# Patient Record
Sex: Male | Born: 2000 | Race: Black or African American | Hispanic: No | Marital: Single | Smoking: Current some day smoker
Health system: Southern US, Community
[De-identification: ages and names within clinical notes are randomized; demographics above are authoritative.]

## PROBLEM LIST (undated history)

## (undated) DIAGNOSIS — D573 Sickle-cell trait: Secondary | ICD-10-CM

## (undated) DIAGNOSIS — D649 Anemia, unspecified: Secondary | ICD-10-CM

## (undated) HISTORY — PX: CIRCUMCISION: SHX1350

---
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
## 2019-04-20 ENCOUNTER — Other Ambulatory Visit: Payer: Self-pay

## 2019-04-20 ENCOUNTER — Observation Stay (HOSPITAL_COMMUNITY)
Admission: RE | Admit: 2019-04-20 | Discharge: 2019-04-21 | Disposition: A | Discharge status: Home / Self Care | Payer: Federal, State, Local not specified - PPO | Attending: Psychiatry | Admitting: Psychiatry

## 2019-04-20 ENCOUNTER — Encounter (HOSPITAL_COMMUNITY): Payer: Self-pay | Admitting: Family

## 2019-04-20 DIAGNOSIS — F329 Major depressive disorder, single episode, unspecified: Secondary | ICD-10-CM | POA: Diagnosis present

## 2019-04-20 DIAGNOSIS — R440 Auditory hallucinations: Secondary | ICD-10-CM | POA: Insufficient documentation

## 2019-04-20 DIAGNOSIS — D573 Sickle-cell trait: Secondary | ICD-10-CM | POA: Diagnosis not present

## 2019-04-20 DIAGNOSIS — F12951 Cannabis use, unspecified with psychotic disorder with hallucinations: Principal | ICD-10-CM | POA: Insufficient documentation

## 2019-04-20 DIAGNOSIS — Z20822 Contact with and (suspected) exposure to covid-19: Secondary | ICD-10-CM | POA: Diagnosis not present

## 2019-04-20 LAB — RESPIRATORY PANEL BY RT PCR (FLU A&B, COVID)
Influenza A by PCR: NEGATIVE
Influenza B by PCR: NEGATIVE
SARS Coronavirus 2 by RT PCR: NEGATIVE

## 2019-04-20 MED ORDER — ZIPRASIDONE MESYLATE 20 MG IM SOLR
10.0000 mg | Freq: Once | INTRAMUSCULAR | Status: AC
  Administered 2019-04-20: 10 mg via INTRAMUSCULAR
  Filled 2019-04-20: qty 20

## 2019-04-20 MED ORDER — HYDROXYZINE HCL 25 MG PO TABS: 25.0000 mg | ORAL_TABLET | Freq: Three times a day (TID) | ORAL | Status: DC | PRN

## 2019-04-20 MED ORDER — MAGNESIUM HYDROXIDE 400 MG/5ML PO SUSP
30.0000 mL | Freq: Every day | ORAL | Status: DC | PRN
Start: 2019-04-20 — End: 2019-04-21

## 2019-04-20 MED ORDER — LORAZEPAM 1 MG PO TABS: 2.0000 mg | ORAL_TABLET | ORAL | Status: DC | PRN

## 2019-04-20 MED ORDER — ACETAMINOPHEN 325 MG PO TABS: 650.0000 mg | ORAL_TABLET | Freq: Four times a day (QID) | ORAL | Status: DC | PRN

## 2019-04-20 MED ORDER — ALUM & MAG HYDROXIDE-SIMETH 200-200-20 MG/5ML PO SUSP: 30.0000 mL | ORAL | Status: DC | PRN

## 2019-04-20 MED ORDER — RISPERIDONE 3 MG PO TABS: 3.0000 mg | ORAL_TABLET | Freq: Every day | ORAL | Status: DC

## 2019-04-20 MED ORDER — TRAZODONE HCL 50 MG PO TABS: 50.0000 mg | ORAL_TABLET | Freq: Every evening | ORAL | Status: DC | PRN

## 2019-04-20 NOTE — H&P (Addendum)
Behavioral Health Medical Screening Exam  Garrett Bolton is an 19 y.o. male. Presented under IVC due to bazaar behavior. Family reported patient as been responding to internal stimuli after using marijuana. Stated patient symptoms started about one ago. Patient present with parania and thought blocking. Patient validates auditory hallucination. Patient to be admitted for inpatient admission. Support, encouragement and reassurance was provided.     Total Time spent with patient: 15 minutes  Psychiatric Specialty Exam: Physical Exam  Review of Systems  Blood pressure (!) 148/96, pulse 88, temperature 97.7 F (36.5 C), temperature source Oral, resp. rate 18, SpO2 99 %.There is no height or weight on file to calculate BMI.  General Appearance: Casual  Eye Contact:  Minimal  Speech:  Blocked and Slow  Volume:  Decreased  Mood:  Depressed, Dysphoric and Irritable  Affect:  Flat  Thought Process:  Disorganized and Descriptions of Associations: Circumstantial  Orientation:  Other:  person and place  Thought Content:  Hallucinations: Auditory  Suicidal Thoughts:  Yes.  without intent/plan  Homicidal Thoughts:  No  Memory:  Immediate;   Poor Recent;   Poor  Judgement:  Impaired  Insight:  Lacking  Psychomotor Activity:  Restlessness  Concentration: Concentration: Poor  Recall:  Poor  Fund of Knowledge:Poor  Language: Fair  Akathisia:  No  Handed:  Right  AIMS (if indicated):     Assets:  Communication Skills Desire for Improvement Social Support Talents/Skills  Sleep:       Musculoskeletal: Strength & Muscle Tone: within normal limits Gait & Station: normal Patient leans: N/A  Blood pressure (!) 148/96, pulse 88, temperature 97.7 F (36.5 C), temperature source Oral, resp. rate 18, SpO2 99 %.  Recommendations: Inpatient admission 500 unit when avab.  Based on my evaluation the patient does not appear to have an emergency medical condition.  Oneta Rack, NP 04/20/2019,  6:11 PM

## 2019-04-20 NOTE — Plan of Care (Signed)
[facility] Crises Note  Reason for Crisis Plan:  Paranoia & Bizarre Bebaviors  Plan of Care:  Medication & other therapy as needed (To be determined by Providers & Treatment team)  Family Support:    Sister and other Family members  Current Living Environment:  Living Arrangements: Other relatives  Insurance:   Hospital Account    Name Acct ID Class Status Primary Coverage   Auron, Tadros [id number] BEHAVIORAL HEALTH OBSERVATION Open BLUE CROSS BLUE SHIELD - BCBS/[employer] PPO        Guarantor Account (for Hospital Account [id number])    Name Relation to Pt Service Area Active? Acct Type   [name] Self [facility] Yes Personal/Family   [place] [address]       [address], [place] [address] [phone](H)          Coverage Information (for Hospital Account [id number])    1. BLUE CROSS BLUE SHIELD/BCBS/F[employer]AL EMP PPO    F/O Payor/Plan Precert #   BLUE CROSS BLUE SHIELD/BCBS/[employer]AL E[employer] PPO    Subscriber Subscriber #   Ander Gaster [id number]   Address Phone   [address] [place], [place] [address] [phone]       2. [place]    F/O Payor/Plan Precert #   [facility] MEDICAID/[place]    Subscriber Subscriber #   Zebulan, Hinshaw [id number] P   Address Phone   [address] [place], [place] [address] [phone]          Legal Guardian:     Primary Care Provider:  Albina Billet, MD  Current Outpatient Providers:  [facility]  Psychiatrist:  Name of Psychiatrist: none report   Counselor/Therapist:  Name of Therapist: none report   Compliant with Medications:  N/A  Additional Information: Patient is a 19 year old African American male involuntarily committed by his sister for bizarre behaviors and paranoia after he smoked marijuana.  Pt calm and cooperative with admissions process, denies SI/HI/AVH currently, has some thought blocking.  Pt states he does not know why his sister took out IVC papers on  him and stated "I was sitting there minding my business and the next thing, I knew, the police came and got me.  I do not know why she did this".    Pt denies any recreational drug use, states that he is on house arrest, and observed to have an ankle law enforcement monitor.  Pt also not forthcoming as to why he is on house arrest.  Q15 minute checks initiated for safety, pt educated on admissions process, verbalizes understanding, non invasive skin assessment completed.   Tomas Schamp 1/31/202110:09 PM

## 2019-04-20 NOTE — BH Assessment (Signed)
Assessment Note  Garrett Bolton is an 19 y.o. male present to [facility] via IVC taking out by his sister due to bazaar behavior that he has been displaying the past week. Per sister report patient has been acting 'strange' since he smoked marijuana, he's responding to internal stimuli. Patient present with parania and thought blocking. Patient validates auditory hallucination.    Disposition:  Hillery Jacks, NP, Patient to be admitted for inpatient admission.   Diagnosis:  F23   Brief psychotic disorder  Past Medical History:  Past Medical History:  Diagnosis Date  . Anemia   . Sickle cell trait [facility])     Past Surgical History:  Procedure Laterality Date  . CIRCUMCISION  2001    Family History: No family history on file.  Social History:  reports that he has never smoked. He has never used smokeless tobacco. He reports that he does not drink alcohol or use drugs.  Additional Social History:  Alcohol / Drug Use Pain Medications: see MAR Prescriptions: see MAR Over the Counter: see MAR History of alcohol / drug use?: Yes Substance #1 Name of Substance 1: THC 1 - Age of First Use: unknown 1 - Amount (size/oz): unknown 1 - Frequency: unknown 1 - Duration: unknown 1 - Last Use / Amount: unknown  CIWA: CIWA-Ar BP: (!) 148/96 Pulse Rate: 88 COWS:    Allergies: No Known Allergies  Home Medications: (Not in a hospital admission)   OB/GYN Status:  No LMP for male patient.  General Assessment Data Location of Assessment: [facility](walk-in) TTS Assessment: In system Is this a Tele or Face-to-Face Assessment?: Face-to-Face Is this an Initial Assessment or a Re-assessment for this encounter?: Initial Assessment Patient Accompanied by:: Adult(sister) Permission Given to speak with another: Yes Name, Relationship and Phone Number: sister - Deyton Ellenbecker [phone]) Language Other than English: No Living Arrangements: (UTA) What gender do you identify as?:  Male Marital status: Single Living Arrangements: (UTA) Can pt return to current living arrangement?: Yes Admission Status: Involuntary Petitioner: Family member(sister; Roland Rack [phone]) ) Is patient capable of signing voluntary admission?: (No; altered memory ) Referral Source: Self/Family/Friend(IVC ) Insurance type: Medicaid      Crisis Care Plan Living Arrangements: (UTA) Name of Psychiatrist: none report  Name of Therapist: none report   Education Status Is patient currently in school?: No Is the patient employed, unemployed or receiving disability?: Employed  Risk to self with the past 6 months Suicidal Ideation: No Has patient been a risk to self within the past 6 months prior to admission? : No Suicidal Intent: No Has patient had any suicidal intent within the past 6 months prior to admission? : No Is patient at risk for suicide?: No Suicidal Plan?: No Has patient had any suicidal plan within the past 6 months prior to admission? : No Access to Means: No What has been your use of drugs/alcohol within the last 12 months?: THC  Previous Attempts/Gestures: No How many times?: 0 Other Self Harm Risks: none report  Triggers for Past Attempts: None known Intentional Self Injurious Behavior: (none report) Family Suicide History: Unable to assess Recent stressful life event(s): (UTA ) Persecutory voices/beliefs?: Rich Reining ) Depression: (UTA ) Depression Symptoms: (UTA) Substance abuse history and/or treatment for substance abuse?: No Suicide prevention information given to non-admitted patients: Not applicable  Risk to Others within the past 6 months Homicidal Ideation: No Does patient have any lifetime risk of violence toward others beyond the six months prior to admission? : No  Thoughts of Harm to Others: No Current Homicidal Intent: No Current Homicidal Plan: No Access to Homicidal Means: No Identified Victim: n/a History of harm to others?:  No Assessment of Violence: None Noted Violent Behavior Description: None Noted  Does patient have access to weapons?: No Criminal Charges Pending?: No Does patient have a court date: No Is patient on probation?: No  Psychosis Hallucinations: (UTA) Delusions: (UTA)  Mental Status Report Appearance/Hygiene: Unable to Assess Eye Contact: Unable to Assess Motor Activity: Unable to assess Speech: Unable to assess Level of Consciousness: Unable to assess Mood: (UTA) Affect: Unable to Assess Anxiety Level: None Thought Processes: Unable to Assess Judgement: Unable to Assess Orientation: Unable to assess Obsessive Compulsive Thoughts/Behaviors: Unable to Assess  Cognitive Functioning Concentration: Unable to Assess Memory: Unable to Assess Is patient IDD: No Insight: Unable to Assess Impulse Control: Unable to Assess Appetite: (per sister report, pt is not eating ) Have you had any weight changes? : No Change Sleep: Unable to Assess Total Hours of Sleep: (per sister report, pt is not sleep) Vegetative Symptoms: Unable to Assess  ADLScreening [facility] Assessment Services) Patient's cognitive ability adequate to safely complete daily activities?: Yes Patient able to express need for assistance with ADLs?: Yes Independently performs ADLs?: Yes (appropriate for developmental age)  Prior Inpatient Therapy Prior Inpatient Therapy: No  Prior Outpatient Therapy Prior Outpatient Therapy: No Does patient have an ACCT team?: No Does patient have Intensive In-House Services?  : No Does patient have [facility] services? : No Does patient have P4CC services?: No  ADL Screening (condition at time of admission) Patient's cognitive ability adequate to safely complete daily activities?: Yes Is the patient deaf or have difficulty hearing?: No Does the patient have difficulty seeing, even when wearing glasses/contacts?: No Does the patient have difficulty concentrating, remembering, or making  decisions?: No Patient able to express need for assistance with ADLs?: Yes Does the patient have difficulty dressing or bathing?: No Independently performs ADLs?: Yes (appropriate for developmental age) Does the patient have difficulty walking or climbing stairs?: No       Abuse/Neglect Assessment (Assessment to be complete while patient is alone) Abuse/Neglect Assessment Can Be Completed: Unable to assess, patient is non-responsive or altered mental status     Advance Directives (For Healthcare) Does Patient Have a Medical Advance Directive?: No Would patient like information on creating a medical advance directive?: No - Patient declined          Disposition:  Disposition Initial Assessment Completed for this Encounter: Darci Current, NP, patient recommend for inpt treatment ) Disposition of Patient: Admit(Tanika Lewis, NP, patient recommended for inpatient treatmen) Type of inpatient treatment program: Adult Patient refused recommended treatment: No  On Site Evaluation by:   Reviewed with Physician:    Despina Hidden 04/20/2019 6:32 PM

## 2019-04-21 ENCOUNTER — Encounter (HOSPITAL_COMMUNITY): Payer: Self-pay | Admitting: Family

## 2019-04-21 DIAGNOSIS — F12951 Cannabis use, unspecified with psychotic disorder with hallucinations: Secondary | ICD-10-CM

## 2019-04-21 LAB — COMPREHENSIVE METABOLIC PANEL
ALT: 14 U/L (ref 0–44)
AST: 23 U/L (ref 15–41)
Albumin: 4.8 g/dL (ref 3.5–5.0)
Alkaline Phosphatase: 78 U/L (ref 38–126)
Anion gap: 11 (ref 5–15)
BUN: 15 mg/dL (ref 6–20)
CO2: 25 mmol/L (ref 22–32)
Calcium: 9.7 mg/dL (ref 8.9–10.3)
Chloride: 106 mmol/L (ref 98–111)
Creatinine, Ser: 0.85 mg/dL (ref 0.61–1.24)
GFR calc Af Amer: >60 mL/min (ref >60)
GFR calc non Af Amer: >60 mL/min (ref >60)
Glucose, Bld: 101 mg/dL — ABNORMAL HIGH (ref 70–99)
Potassium: 3.9 mmol/L (ref 3.5–5.1)
Sodium: 142 mmol/L (ref 135–145)
Total Bilirubin: 1 mg/dL (ref 0.3–1.2)
Total Protein: 7.7 g/dL (ref 6.5–8.1)

## 2019-04-21 LAB — TSH: TSH: 0.734 u[IU]/mL (ref 0.350–4.500)

## 2019-04-21 LAB — LIPID PANEL
Cholesterol: 153 mg/dL (ref 0–200)
HDL: 52 mg/dL (ref >40)
LDL Cholesterol: 90 mg/dL (ref 0–99)
Total CHOL/HDL Ratio: 2.9 RATIO
Triglycerides: 53 mg/dL (ref <150)
VLDL: 11 mg/dL (ref 0–40)

## 2019-04-21 LAB — CBC
HCT: 46.3 % (ref 39.0–52.0)
Hemoglobin: 14.1 g/dL (ref 13.0–17.0)
MCH: 23.4 pg — ABNORMAL LOW (ref 26.0–34.0)
MCHC: 30.5 g/dL (ref 30.0–36.0)
MCV: 76.9 fL — ABNORMAL LOW (ref 80.0–100.0)
Platelets: 297 10*3/uL (ref 150–400)
RBC: 6.02 MIL/uL — ABNORMAL HIGH (ref 4.22–5.81)
RDW: 13.2 % (ref 11.5–15.5)
WBC: 7.8 10*3/uL (ref 4.0–10.5)
nRBC: 0 % (ref 0.0–0.2)

## 2019-04-21 LAB — HEMOGLOBIN A1C
Hgb A1c MFr Bld: 4.3 % — ABNORMAL LOW (ref 4.8–5.6)
Mean Plasma Glucose: 76.71 mg/dL

## 2019-04-21 MED ORDER — OLANZAPINE 2.5 MG PO TABS: 2.5000 mg | ORAL_TABLET | Freq: Every day | ORAL | 0 refills | Status: DC

## 2019-04-21 MED ORDER — TRAZODONE HCL 50 MG PO TABS: 150.0000 mg | ORAL_TABLET | Freq: Every day | ORAL | Status: DC

## 2019-04-21 MED ORDER — RISPERIDONE 3 MG PO TABS: 3.0000 mg | ORAL_TABLET | Freq: Every day | ORAL | 0 refills | Status: DC

## 2019-04-21 MED ORDER — OMEGA-3-ACID ETHYL ESTERS 1 G PO CAPS
1.0000 g | ORAL_CAPSULE | Freq: Two times a day (BID) | ORAL | Status: DC
  Filled 2019-04-21: qty 1

## 2019-04-21 MED ORDER — TRAZODONE HCL 150 MG PO TABS: 150.0000 mg | ORAL_TABLET | Freq: Every day | ORAL | 0 refills | Status: DC
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
## 2019-04-21 NOTE — Progress Notes (Signed)
Patient ID: Garrett Bolton, male   DOB: 2000/12/18, 19 y.o.   MRN: [id number] Pt transferred to the 400 hall without  Incidence, still demanding to go home, V/S WNL, report given to assigned day shift RN, Q15 minute checks in place.

## 2019-04-21 NOTE — Progress Notes (Signed)
Discharge:  Pt is a black male  of 18 years. Pt is alert and oriented X 4. Pt given AVS packet to include medication information update, future appointment information, and other resource information. Pt given all belongings and signature obtained. Pt discharged to private resident with family member. Transportation provided my family member.    Einar Crow. Melvyn Neth MSN, RN, [facility] [phone]

## 2019-04-21 NOTE — BH Assessment (Signed)
[facility] Assessment Progress Note  Per Nelly Rout, MD, this pt does not require psychiatric hospitalization at this time.  Pt presents under IVC initiated by pt's sister, which Dr Lucianne Muss has rescinded.  Pt is to be discharged from the [facility] with referral information for area therapists.  This has been included in pt's discharge instructions.  Pt's nurse has been notified.  Doylene Canning, MA Triage Specialist [phone]
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
## 2019-04-21 NOTE — Discharge Instructions (Signed)
For your behavioral health needs, you are advised to follow up with an outpatient therapist.  Contact one of the following providers at your earliest opportunity to ask about scheduling an intake appointment:       [facility]      [address]      [place], [place] [address]      [phone]       [facility]      [address]      [place], [place] [address]      [phone]       [facility]      [address]      [place], [place] [address]      [phone]
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
## 2019-04-21 NOTE — Progress Notes (Signed)

## 2019-04-22 LAB — PROLACTIN: Prolactin: 15.2 ng/mL (ref 4.0–15.2)

## 2019-04-24 ENCOUNTER — Ambulatory Visit (HOSPITAL_COMMUNITY)
Admission: RE | Admit: 2019-04-24 | Discharge: 2019-04-24 | Disposition: A | Discharge status: Home / Self Care | Payer: Medicaid Other | Attending: Psychiatry | Admitting: Psychiatry

## 2019-04-24 ENCOUNTER — Other Ambulatory Visit: Payer: Self-pay

## 2019-04-24 ENCOUNTER — Emergency Department (HOSPITAL_COMMUNITY)
Admission: EM | Admit: 2019-04-24 | Discharge: 2019-04-25 | Disposition: A | Discharge status: Other Acute Inpatient Hospital | Payer: Federal, State, Local not specified - PPO | Source: Home / Self Care | Attending: Emergency Medicine | Admitting: Emergency Medicine

## 2019-04-24 DIAGNOSIS — Z79899 Other long term (current) drug therapy: Secondary | ICD-10-CM | POA: Insufficient documentation

## 2019-04-24 DIAGNOSIS — R4689 Other symptoms and signs involving appearance and behavior: Secondary | ICD-10-CM

## 2019-04-24 DIAGNOSIS — Z20822 Contact with and (suspected) exposure to covid-19: Secondary | ICD-10-CM | POA: Insufficient documentation

## 2019-04-24 DIAGNOSIS — F22 Delusional disorders: Secondary | ICD-10-CM | POA: Insufficient documentation

## 2019-04-24 DIAGNOSIS — F12951 Cannabis use, unspecified with psychotic disorder with hallucinations: Secondary | ICD-10-CM | POA: Insufficient documentation

## 2019-04-24 LAB — COMPREHENSIVE METABOLIC PANEL
ALT: 16 U/L (ref 0–44)
AST: 23 U/L (ref 15–41)
Albumin: 4.5 g/dL (ref 3.5–5.0)
Alkaline Phosphatase: 71 U/L (ref 38–126)
Anion gap: 7 (ref 5–15)
BUN: 17 mg/dL (ref 6–20)
CO2: 27 mmol/L (ref 22–32)
Calcium: 9.4 mg/dL (ref 8.9–10.3)
Chloride: 106 mmol/L (ref 98–111)
Creatinine, Ser: 0.94 mg/dL (ref 0.61–1.24)
GFR calc Af Amer: >60 mL/min (ref >60)
GFR calc non Af Amer: >60 mL/min (ref >60)
Glucose, Bld: 97 mg/dL (ref 70–99)
Potassium: 3.8 mmol/L (ref 3.5–5.1)
Sodium: 140 mmol/L (ref 135–145)
Total Bilirubin: 0.5 mg/dL (ref 0.3–1.2)
Total Protein: 6.9 g/dL (ref 6.5–8.1)

## 2019-04-24 LAB — CBC WITH DIFFERENTIAL/PLATELET
Abs Immature Granulocytes: 0.02 10*3/uL (ref 0.00–0.07)
Basophils Absolute: 0.1 10*3/uL (ref 0.0–0.1)
Basophils Relative: 1 %
Eosinophils Absolute: 0.3 10*3/uL (ref 0.0–0.5)
Eosinophils Relative: 3 %
HCT: 40.5 % (ref 39.0–52.0)
Hemoglobin: 12.2 g/dL — ABNORMAL LOW (ref 13.0–17.0)
Immature Granulocytes: 0 %
Lymphocytes Relative: 39 %
Lymphs Abs: 3.2 10*3/uL (ref 0.7–4.0)
MCH: 23.2 pg — ABNORMAL LOW (ref 26.0–34.0)
MCHC: 30.1 g/dL (ref 30.0–36.0)
MCV: 77.1 fL — ABNORMAL LOW (ref 80.0–100.0)
Monocytes Absolute: 0.6 10*3/uL (ref 0.1–1.0)
Monocytes Relative: 7 %
Neutro Abs: 4 10*3/uL (ref 1.7–7.7)
Neutrophils Relative %: 50 %
Platelets: 286 10*3/uL (ref 150–400)
RBC: 5.25 MIL/uL (ref 4.22–5.81)
RDW: 12.8 % (ref 11.5–15.5)
WBC: 8.1 10*3/uL (ref 4.0–10.5)
nRBC: 0 % (ref 0.0–0.2)

## 2019-04-24 LAB — RAPID URINE DRUG SCREEN, HOSP PERFORMED
Amphetamines: NOT DETECTED
Barbiturates: NOT DETECTED
Benzodiazepines: NOT DETECTED
Cocaine: NOT DETECTED
Opiates: NOT DETECTED
Tetrahydrocannabinol: POSITIVE — AB

## 2019-04-24 LAB — ETHANOL: Alcohol, Ethyl (B): <10 mg/dL (ref <10)

## 2019-04-24 MED ORDER — TRAZODONE HCL 50 MG PO TABS
150.0000 mg | ORAL_TABLET | Freq: Every day | ORAL | Status: DC
  Filled 2019-04-24: qty 1

## 2019-04-24 MED ORDER — RISPERIDONE 2 MG PO TABS
3.0000 mg | ORAL_TABLET | Freq: Every day | ORAL | Status: DC
  Filled 2019-04-24: qty 1
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
## 2019-04-24 NOTE — ED Triage Notes (Signed)
Pt brought to [facility] by [employer]. Pt IVCd . Pt brought to [facility] from [facility] for Medical Clearance . Pt attempted to jump off stairs to plaza level of courthouse , ran into oncoming traffic. Paranoid.

## 2019-04-24 NOTE — Progress Notes (Signed)
Received Garrett Bolton awake in his room with the sitter at the bedside.Later he gave an urine specimen,  allowed this writer to draw his blood and perform a Covid test. He did not respond when asked what brought him to the hospital. He had an outburst of laughter and stated he was thinking about something funny. He refused his night time medications. He slept throughout the night without incident.

## 2019-04-25 ENCOUNTER — Inpatient Hospital Stay (HOSPITAL_COMMUNITY)
Admission: AD | Admit: 2019-04-25 | Discharge: 2019-04-29 | DRG: 885 | Disposition: A | Discharge status: Home / Self Care | Payer: Federal, State, Local not specified - PPO | Attending: Psychiatry | Admitting: Psychiatry

## 2019-04-25 ENCOUNTER — Encounter (HOSPITAL_COMMUNITY): Payer: Self-pay | Admitting: Psychiatry

## 2019-04-25 DIAGNOSIS — Z20822 Contact with and (suspected) exposure to covid-19: Secondary | ICD-10-CM | POA: Diagnosis present

## 2019-04-25 DIAGNOSIS — D573 Sickle-cell trait: Secondary | ICD-10-CM | POA: Diagnosis present

## 2019-04-25 DIAGNOSIS — Z87820 Personal history of traumatic brain injury: Secondary | ICD-10-CM | POA: Diagnosis not present

## 2019-04-25 DIAGNOSIS — F12951 Cannabis use, unspecified with psychotic disorder with hallucinations: Secondary | ICD-10-CM | POA: Diagnosis not present

## 2019-04-25 DIAGNOSIS — Z79899 Other long term (current) drug therapy: Secondary | ICD-10-CM | POA: Diagnosis not present

## 2019-04-25 DIAGNOSIS — F29 Unspecified psychosis not due to a substance or known physiological condition: Secondary | ICD-10-CM | POA: Diagnosis present

## 2019-04-25 DIAGNOSIS — F12251 Cannabis dependence with psychotic disorder with hallucinations: Secondary | ICD-10-CM | POA: Diagnosis present

## 2019-04-25 DIAGNOSIS — F2081 Schizophreniform disorder: Secondary | ICD-10-CM | POA: Diagnosis not present

## 2019-04-25 LAB — RESPIRATORY PANEL BY RT PCR (FLU A&B, COVID)
Influenza A by PCR: NEGATIVE
Influenza B by PCR: NEGATIVE
SARS Coronavirus 2 by RT PCR: NEGATIVE

## 2019-04-25 MED ORDER — TRAZODONE HCL 150 MG PO TABS
150.0000 mg | ORAL_TABLET | Freq: Every day | ORAL | Status: DC
  Administered 2019-04-26 – 2019-04-28 (×3): 150 mg via ORAL
  Filled 2019-04-25 (×6): qty 1

## 2019-04-25 MED ORDER — MAGNESIUM HYDROXIDE 400 MG/5ML PO SUSP: 30.0000 mL | Freq: Every day | ORAL | Status: DC | PRN

## 2019-04-25 MED ORDER — RISPERIDONE 3 MG PO TABS
3.0000 mg | ORAL_TABLET | Freq: Every day | ORAL | Status: DC
  Filled 2019-04-25: qty 1

## 2019-04-25 MED ORDER — RISPERIDONE 2 MG PO TABS
4.0000 mg | ORAL_TABLET | Freq: Every day | ORAL | Status: DC
  Administered 2019-04-25 – 2019-04-26 (×2): 4 mg via ORAL
  Filled 2019-04-25 (×5): qty 2

## 2019-04-25 MED ORDER — OMEGA-3-ACID ETHYL ESTERS 1 G PO CAPS
1.0000 g | ORAL_CAPSULE | Freq: Two times a day (BID) | ORAL | Status: DC
  Administered 2019-04-25 – 2019-04-29 (×8): 1 g via ORAL
  Filled 2019-04-25 (×11): qty 1

## 2019-04-25 MED ORDER — ALUM & MAG HYDROXIDE-SIMETH 200-200-20 MG/5ML PO SUSP: 30.0000 mL | ORAL | Status: DC | PRN

## 2019-04-25 MED ORDER — LORAZEPAM 2 MG/ML IJ SOLN: 2.0000 mg | INTRAMUSCULAR | Status: DC | PRN

## 2019-04-25 MED ORDER — HYDROXYZINE HCL 25 MG PO TABS: 25.0000 mg | ORAL_TABLET | Freq: Three times a day (TID) | ORAL | Status: DC | PRN

## 2019-04-25 MED ORDER — ACETAMINOPHEN 325 MG PO TABS: 650.0000 mg | ORAL_TABLET | Freq: Four times a day (QID) | ORAL | Status: DC | PRN

## 2019-04-25 MED ORDER — LORAZEPAM 1 MG PO TABS: 2.0000 mg | ORAL_TABLET | ORAL | Status: DC | PRN

## 2019-04-25 NOTE — Tx Team (Signed)
Initial Treatment Plan 04/25/2019 7:37 PM Garrett Bolton SUP:[id number]    PATIENT STRESSORS: Legal issue Marital or family conflict Substance abuse   PATIENT STRENGTHS: Special hobby/interest Supportive family/friends Work skills   PATIENT IDENTIFIED PROBLEMS: Anxiety  "Legal Stuff"  "Family stuff"                 DISCHARGE CRITERIA:  Ability to meet basic life and health needs Improved stabilization in mood, thinking, and/or behavior  PRELIMINARY DISCHARGE PLAN: Outpatient therapy  PATIENT/FAMILY INVOLVEMENT: This treatment plan has been presented to and reviewed with the patient, Garrett Bolton.  The patient has been given the opportunity to ask questions and make suggestions.  Garnette Scheuermann, RN 04/25/2019, 7:37 PM

## 2019-04-25 NOTE — Progress Notes (Signed)
Pt is a 19 yo male involuntarily admitted to [facility] for SI.  Pt denies SI/HI/AVH.  Stated that he wants to go home because "all of this is a misunderstanding."  Pt admits to ongoing legal issues but he would not disclose anything specific about the legal issues. Pt said that he did not tell anyone that he was going to jump off a bridge at the courthouse. Pt said that he has not used THC in 2 weeks.  Denies using alcohol.  Pt was respectful and calm during the assessment. Pt signed admission paperwork and verbalized understanding of admission plan of care. RN oriented pt to staff, unit, and room.  Initiated q15 min safety checks.

## 2019-04-25 NOTE — ED Notes (Signed)
Report given to Will at [facility]. [employer] called for transport.

## 2019-04-25 NOTE — ED Notes (Signed)
Refuses breakfast at this time. Moved to another room and was cooperative with the move. Returned to sleep.
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
## 2019-04-25 NOTE — Progress Notes (Signed)
   04/25/19 2200  Psych Admission Type (Psych Patients Only)  Admission Status Involuntary  Psychosocial Assessment  Patient Complaints Anxiety;Agitation;Malaise;Suspiciousness;Worrying  Eye Contact Brief  Facial Expression Anxious  Affect Flat  Speech Logical/coherent  Interaction Assertive  Motor Activity Slow  Appearance/Hygiene Disheveled  Behavior Characteristics Anxious;Resistant to care  Mood Anxious;Suspicious;Preoccupied  Thought Process  Coherency WDL  Content WDL  Delusions None reported or observed  Perception WDL  Hallucination None reported or observed  Judgment Impaired  Confusion WDL  Danger to Self  Current suicidal ideation? Denies  Danger to Others  Danger to Others None reported or observed   Pt initially refused to take medication. After much encouragement pt took the medication. Pt very paranoid about taking medication . Pt has no insight into his Tx. Pt was educated on the IVC process and the Psychiatric inpatient flow, but pt not receptive at this time. Pt took the medication PO and 2 minutes pt tried to say he was having weired effects to the medication, pt was informed any effects he was having was from something he did before coming to the hospital or something going on in his head.

## 2019-04-25 NOTE — BHH Suicide Risk Assessment (Signed)
[facility] Admission Suicide Risk Assessment   Total Time spent with patient: 45 minutes Principal Problem: <principal problem not specified> Diagnosis:  Active Problems:   Cannabis-induced psychotic disorder with hallucinations (HCC)   Psychosis (HCC)  Subjective Data: new onset psychosis  Continued Clinical Symptoms:    The "Alcohol Use Disorders Identification Test", Guidelines for Use in Primary Care, Second Edition.  World Science writer [facility]). Score between 0-7:  no or low risk or alcohol related problems. Score between 8-15:  moderate risk of alcohol related problems. Score between 16-19:  high risk of alcohol related problems. Score 20 or above:  warrants further diagnostic evaluation for alcohol dependence and treatment.   CLINICAL FACTORS:   Schizophrenia:   Less than 68 years old Paranoid or undifferentiated type   Musculoskeletal: Strength & Muscle Tone: within normal limits Gait & Station: normal Patient leans: N/A  Psychiatric Specialty Exam: Physical Exam  Nursing note and vitals reviewed. Constitutional: He appears well-developed and well-nourished.  Cardiovascular: Normal rate and regular rhythm.    Review of Systems  Constitutional: Negative.   HENT: Negative.   Eyes: Negative.   Respiratory: Negative.   Endocrine: Negative.   Genitourinary: Negative.   Neurological: Negative.   Hematological: Negative.     There were no vitals taken for this visit.There is no height or weight on file to calculate BMI.  General Appearance: Casual  Eye Contact:  Fair  Speech:  Clear and Coherent  Volume:  Increased  Mood:  Anxious and Irritable  Affect:  Congruent  Thought Process:  Irrelevant and Descriptions of Associations: Loose  Orientation:  Full (Time, Place, and Person)  Thought Content:  Delusions and Hallucinations: Auditory  Suicidal Thoughts:  No  Homicidal Thoughts:  No  Memory:  Immediate;   Poor Recent;   Fair Remote;   Fair  Judgement:   Impaired  Insight:  Lacking  Psychomotor Activity:  Normal  Concentration:  Concentration: Fair and Attention Span: Fair  Recall:  [employer] of Knowledge:  Fair  Language:  Fair  Akathisia:  Negative  Handed:  Right  AIMS (if indicated):     Assets:  Communication Skills Desire for Improvement  ADL's:  Intact  Cognition:  WNL  Sleep:       COGNITIVE FEATURES THAT CONTRIBUTE TO RISK:  Loss of executive function    SUICIDE RISK:   Minimal: No identifiable suicidal ideation.  Patients presenting with no risk factors but with morbid ruminations; may be classified as minimal risk based on the severity of the depressive symptoms  PLAN OF CARE: see eval  I certify that inpatient services furnished can reasonably be expected to improve the patient's condition.   Malvin Johns, MD 04/25/2019, 4:07 PM

## 2019-04-25 NOTE — ED Notes (Signed)
Transported to [facility] by [employer]. All belongings returned to [facility] for pt. PT was calm and cooperative.

## 2019-04-25 NOTE — ED Notes (Signed)
EKG completed and given to Dr. Allen. 

## 2019-04-26 DIAGNOSIS — F29 Unspecified psychosis not due to a substance or known physiological condition: Secondary | ICD-10-CM

## 2019-04-26 NOTE — [facility] Group Notes (Signed)
  [facility] LCSW Group Therapy Note  Date/Time:  04/26/2019 11:05AM-11:40AM  Type of Therapy and Topic:  Group Therapy:  Feelings About Hospitalization  Participation Level:  Active   Description of Group This process group involved patients discussing their feelings related to being hospitalized, as well as the benefits they see to being in the hospital.  These feelings and benefits were itemized.  The group then brainstormed specific ways in which they could seek those same benefits when they discharge and return home.  Therapeutic Goals 1. Patient will identify and describe positive and negative feelings related to hospitalization 2. Patient will verbalize benefits of hospitalization to themselves personally 3. Patients will brainstorm together ways they can obtain similar benefits in the outpatient setting, identify barriers to wellness and possible solutions  Summary of Patient Progress:  The patient expressed his primary feelings about being hospitalized are feeling as though he is being held against his own wishes like a "dog in bondage". Pt further identified feeling as though he should be able to leave within a couple of days, maybe even a day, as he really shouldn't be here. Pt expressed feeling welcome by providers however feeling trapped at the same time. Pt expressed concerns surrounding responsibilities he has outside of the hospital and feeling as thought people are "out to get me". Pt reported of not see any benefits of being at the hospital, and that he doesn't feel that he needs support here as well as there being no need to set up supports for aftercare as he already has enough supports. Pt identified he could easily avoid arguing with people so they would not think he is "crazy" and barriers to him improving functioning are jealousy from others trying to stop him from doing what he wants. Pt actively participated throughout the duration of group, proving receptive to feedback from  group members and group facilitator.  Therapeutic Modalities Cognitive Behavioral Therapy Motivational Interviewing    Cyril Loosen, LCSWA 04/26/2019, 2:20PM
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
## 2019-04-26 NOTE — BHH Group Notes (Signed)
Adult Psychoeducational Group Note  Date:  04/26/2019 Time:  12:13 PM  Group Topic/Focus:  Goals Group:   The focus of this group is to help patients establish daily goals to achieve during treatment and discuss how the patient can incorporate goal setting into their daily lives to aide in recovery.  Participation Level:  Active  Participation Quality:  Attentive  Affect:  Flat  Cognitive:  Lacking  Insight: Lacking  Engagement in Group:  Engaged  Modes of Intervention:  Discussion and Support  Additional Comments:  Pt attended the group and stated that he has no idea why he is here except "my sister is messing with my mind". Set a goal today of dealing with his anger by walking up and down the hall or watching TV.  Dione Housekeeper 04/26/2019, 12:13 PM

## 2019-04-27 MED ORDER — RISPERIDONE 3 MG PO TABS
6.0000 mg | ORAL_TABLET | Freq: Every day | ORAL | Status: DC
  Administered 2019-04-27 – 2019-04-28 (×2): 6 mg via ORAL
  Filled 2019-04-27 (×4): qty 2
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
## 2019-04-27 NOTE — Progress Notes (Signed)
D. Pt has been calm and cooperative on the unit- friendly and polite, smiles during interactions- observed in the milieu interacting well with peers and staff throughout the shift. Per pt's self inventory, pt rated his depression, hopelessness and anxiety all 0's. Pt writes that his goal today is "going home, like E.T. phone home". Pt writes that "staying positive' will help him to achieve that goal. Pt reports that he did not like taking his medicine last night because it made him feel "out of it", and states, "I don't like that feeling". Pt currently denies SI/HI and AVH   A. Labs and vitals monitored. Pt supported emotionally and encouraged to express concerns and ask questions.   R. Pt remains safe with 15 minute checks. Will continue POC.

## 2019-04-27 NOTE — B[facility] Group Notes (Signed)
[facility] LCSW Group Therapy Note  Date/Time:  04/27/2019  11:10AM-12:00PM  Type of Therapy and Topic:  Group Therapy:  Music and Mood  Participation Level:  Active   Description of Group: In this process group, members listened to a variety of genres of music and identified that different types of music evoke different responses.  Patients were encouraged to identify music that was soothing for them and music that was energizing for them.  Patients discussed how this knowledge can help with wellness and recovery in various ways including managing depression and anxiety as well as encouraging healthy sleep habits.    Therapeutic Goals: 1. Patients will explore the impact of different varieties of music on mood 2. Patients will verbalize the thoughts they have when listening to different types of music 3. Patients will identify music that is soothing to them as well as music that is energizing to them 4. Patients will discuss how to use this knowledge to assist in maintaining wellness and recovery 5. Patients will explore the use of music as a coping skill  Summary of Patient Progress:  At the beginning of group, patient expressed feeling frustrated because he did not understand why he is still in the hospital and wants to go home.  Pt actively engaged in exploring and processing various feelings and emotions each song made him feel. Pt identified feelings such as being happy/joyful, wanting to conquer greatness, being in deep thought, inspirational, and reminiscent. At the end of group, patient expressed feeling as though a burden had been lifted off his chest and understood how different songs and genres can produce different feelings and improve ones mood. Pt actively participated throughout the duration of group proving receptive to feedback from peers and facilitator.  Therapeutic Modalities: Solution Focused Brief Therapy Activity   Cyril Loosen, LCSWA 04/27/2019, 1:30 PM

## 2019-04-27 NOTE — Progress Notes (Signed)
   04/27/19 1100  Psych Admission Type (Psych Patients Only)  Admission Status Voluntary  Psychosocial Assessment  Patient Complaints None  Eye Contact Fair  Facial Expression Anxious  Affect Appropriate to circumstance  Speech Logical/coherent  Interaction Assertive  Motor Activity Slow  Appearance/Hygiene Improved  Behavior Characteristics Cooperative;Appropriate to situation  Mood Pleasant  Thought Process  Coherency WDL  Content WDL  Delusions None reported or observed  Perception WDL  Hallucination None reported or observed  Judgment Impaired  Confusion WDL  Danger to Self  Current suicidal ideation? Denies  Danger to Others  Danger to Others None reported or observed

## 2019-04-28 MED ORDER — RISPERIDONE 2 MG PO TABS
2.0000 mg | ORAL_TABLET | Freq: Every day | ORAL | Status: DC
  Administered 2019-04-28 – 2019-04-29 (×2): 2 mg via ORAL
  Filled 2019-04-28 (×4): qty 1

## 2019-04-28 NOTE — Progress Notes (Signed)
Patient has been up in the dayroom with other peers watching the game. Another patient had a compliant that he kept staring at her and smiling which made her uncomfortable. Writer spoke with him about this incident but not sure patient if patient comprehended what was discussed. Safety maintained on unit with 15 min checks.     04/27/19 2120  Psych Admission Type (Psych Patients Only)  Admission Status Voluntary  Psychosocial Assessment  Patient Complaints None  Eye Contact Fair  Facial Expression Anxious;Animated  Affect Appropriate to circumstance  Speech Logical/coherent  Interaction Assertive  Motor Activity Slow  Appearance/Hygiene Improved  Behavior Characteristics Cooperative;Anxious  Mood Preoccupied;Pleasant  Thought Process  Coherency WDL  Content WDL  Delusions None reported or observed  Perception WDL  Hallucination None reported or observed  Judgment Impaired  Confusion None  Danger to Self  Current suicidal ideation? Denies  Danger to Others  Danger to Others None reported or observed     04/27/19 2120  Psych Admission Type (Psych Patients Only)  Admission Status Voluntary  Psychosocial Assessment  Patient Complaints None  Eye Contact Fair  Facial Expression Anxious;Animated  Affect Appropriate to circumstance  Speech Logical/coherent  Interaction Assertive  Motor Activity Slow  Appearance/Hygiene Improved  Behavior Characteristics Cooperative;Anxious  Mood Preoccupied;Pleasant  Thought Process  Coherency WDL  Content WDL  Delusions None reported or observed  Perception WDL  Hallucination None reported or observed  Judgment Impaired  Confusion None  Danger to Self  Current suicidal ideation? Denies  Danger to Others  Danger to Others None reported or observed

## 2019-04-28 NOTE — Progress Notes (Signed)
Recreation Therapy Notes  Date: 2.8.21 Time: 1000 Location: 500 [address] Dayroom  Group Topic: Wellness  Goal Area(s) Addresses:  Patient will define components of whole wellness. Patient will verbalize benefit of whole wellness.  Behavioral Response: None  Intervention: Exercise, Music  Activity: Exercise.  LRT led group in a series of stretches to get loosened up.  Patients then took turns leading the group in exercises of their choosing.  Patients were told to pay attention to their bodies and to take breaks if needed.  Education: Wellness, Building control surveyor.   Education Outcome: Acknowledges education/In group clarification offered/Needs additional education.   Clinical Observations/Feedback: Pt came in at the end of group.  Pt observed the last few moments of group.   Caroll Rancher, LRT/CTRS         Lillia Abed, Loc Feinstein A 04/28/2019 11:01 AM

## 2019-04-28 NOTE — Progress Notes (Addendum)
[facility] MD Progress Note  04/28/2019 7:50 AM Garrett Bolton  MRN:  [id number] Subjective:  Previously was admitted for cannabis-induced psychosis and was released on 2/1. Presented on 2/4 with repeat symptoms secondary to cannabis use.  Garrett Bolton is a 19 year old male that is calm with a flat affect. He is requesting to go home, stating that he feels his symptoms were solely due to cannabis laced with "germ". Patient continues to remain paranoid about people watching him. However, he states that he feels safe at home with his sister and her boyfriend. He denies suicidal ideations or hallucinations.  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 Anemia   . Sickle cell trait [facility])     Past Surgical History:  Procedure Laterality Date  . CIRCUMCISION  2001   Family History: History reviewed. No pertinent family history. Family Psychiatric  History: See admission H&P Social History:  Social History   Substance and Sexual Activity  Alcohol Use No     Social History   Substance and Sexual Activity  Drug Use No    Social History   Socioeconomic History  . Marital status: Single    Spouse name: Not on file  . Number of children: Not on file  . Years of education: Not on file  . Highest education level: Not on file  Occupational History  . Not on file  Tobacco Use  . Smoking status: Never Smoker  . Smokeless tobacco: Never Used  Substance and Sexual Activity  . Alcohol use: No  . Drug use: No  . Sexual activity: Never  Other Topics Concern  . Not on file  Social History Narrative  . Not on file   Social Determinants of Health   Financial Resource Strain:   . Difficulty of Paying Living Expenses: Not on file  Food  Insecurity:   . Worried About Programme researcher, broadcasting/film/video in the Last Year: Not on file  . Ran Out of Food in the Last Year: Not on file  Transportation Needs:   . Lack of Transportation (Medical): Not on file  . Lack of Transportation (Non-Medical): Not on file  Physical Activity:   . Days of Exercise per Week: Not on file  . Minutes of Exercise per Session: Not on file  Stress:   . Feeling of Stress : Not on file  Social Connections:   . Frequency of Communication with Friends and Family: Not on file  . Frequency of Social Gatherings with Friends and Family: Not on file  . Attends Religious Services: Not on file  . Active Member of Clubs or Organizations: Not on file  . Attends Banker Meetings: Not on file  . Marital Status: Not on file   Additional Social History:  N/A  Sleep: Good  Appetite:  Good  Current Medications: Current Facility-Administered Medications  Medication Dose Route Frequency Provider Last Rate Last Admin  . acetaminophen (TYLENOL) tablet 650 mg  650 mg Oral Q6H PRN Patrcia Dolly, FNP      . alum & mag hydroxide-simeth (MAALOX/MYLANTA) 200-200-20 MG/5ML suspension 30 mL  30 mL Oral Q4H PRN Patrcia Dolly, FNP      . hydrOXYzine (ATARAX/VISTARIL) tablet 25 mg  25 mg Oral TID PRN Patrcia Dolly, FNP      .  LORazepam (ATIVAN) tablet 2 mg  2 mg Oral Q4H PRN Malvin Johns, MD       Or  . LORazepam (ATIVAN) injection 2 mg  2 mg Intramuscular Q4H PRN Malvin Johns, MD      . magnesium hydroxide (MILK OF MAGNESIA) suspension 30 mL  30 mL Oral Daily PRN Patrcia Dolly, FNP      . omega-3 acid ethyl esters (LOVAZA) capsule 1 g  1 g Oral BID Malvin Johns, MD   1 g at 04/28/19 0737  . risperiDONE (RISPERDAL) tablet 6 mg  6 mg Oral QHS Aldean Baker, NP   6 mg at 04/27/19 2119  . traZODone (DESYREL) tablet 150 mg  150 mg Oral QHS Patrcia Dolly, FNP   150 mg at 04/27/19 2119    Lab Results: No results found for this or any previous visit (from the past 48  hour(s)).  Blood Alcohol level:  Lab Results  Component Value Date   ETH <10 04/24/2019    Metabolic Disorder Labs: Lab Results  Component Value Date   HGBA1C 4.3 (L) 04/21/2019   MPG 76.71 04/21/2019   Lab Results  Component Value Date   PROLACTIN 15.2 04/21/2019   Lab Results  Component Value Date   CHOL 153 04/21/2019   TRIG 53 04/21/2019   HDL 52 04/21/2019   CHOLHDL 2.9 04/21/2019   VLDL 11 04/21/2019   LDLCALC 90 04/21/2019    Physical Findings: AIMS: Facial and Oral Movements Muscles of Facial Expression: None, normal Lips and Perioral Area: None, normal Jaw: None, normal Tongue: None, normal,Extremity Movements Upper (arms, wrists, hands, fingers): None, normal Lower (legs, knees, ankles, toes): None, normal, Trunk Movements Neck, shoulders, hips: None, normal, Overall Severity Severity of abnormal movements (highest score from questions above): None, normal Incapacitation due to abnormal movements: None, normal Patient's awareness of abnormal movements (rate only patient's report): No Awareness, Dental Status Current problems with teeth and/or dentures?: No Does patient usually wear dentures?: No  CIWA:    COWS:     Musculoskeletal: Strength & Muscle Tone: within normal limits Gait & Station: normal Patient leans: N/A  Psychiatric Specialty Exam: Physical Exam  Nursing note and vitals reviewed. Constitutional: He is oriented to person, place, and time. He appears well-developed and well-nourished.  Cardiovascular: Normal rate.  Respiratory: Effort normal.  Neurological: He is alert and oriented to person, place, and time.    Review of Systems  Constitutional: Negative.   Respiratory: Negative for cough and shortness of breath.   Psychiatric/Behavioral: Negative for agitation, behavioral problems, dysphoric mood, hallucinations, self-injury, sleep disturbance and suicidal ideas. The patient is nervous/anxious. The patient is not hyperactive.      Blood pressure 114/69, pulse (!) 105, temperature 97.7 F (36.5 C), temperature source Oral, resp. rate 18, height 5\' 9"  (1.753 m), weight 63.5 kg, SpO2 100 %.Body mass index is 20.67 kg/m.  General Appearance: Casual  Eye Contact:  Good  Speech:  Normal Rate  Volume:  Normal  Mood:  Anxious  Affect:  Congruent  Thought Process:  Descriptions of Associations: Tangential  Orientation:  Full (Time, Place, and Person)  Thought Content:  Delusions and Paranoid Ideation  Suicidal Thoughts:  No  Homicidal Thoughts:  No  Memory:  Immediate;   Fair Recent;   Fair  Judgement:  Impaired  Insight:  Lacking  Psychomotor Activity:  Normal  Concentration:  Concentration: Fair and Attention Span: Fair  Recall:  of Knowledge:  Fair  Language:  Good  Akathisia:  No  Handed:  Right  AIMS (if indicated):     Assets:  Communication Skills Desire for Improvement Financial Resources/Insurance Housing Social Support  ADL's:  Intact  Cognition:  WNL  Sleep:  Number of Hours: 6.5     Treatment Plan Summary: Daily contact with patient to assess and evaluate symptoms and progress in treatment and Medication management  - Continue inpatient hospitalization with careful monitoring - Consider starting long acting antipsychotic - Continue current medications - Continue therapeutic group Escalate risperidone to 8 mg total per day  Discharge disposition in progress.   Fonnie Mu, Medical Student 04/28/2019, 7:50 AM

## 2019-04-28 NOTE — Tx Team (Signed)
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 acetaminophen (TYLENOL) tablet 650 mg  650 mg Oral Q6H PRN Emmaline Kluver, FNP      . alum & mag hydroxide-simeth (MAALOX/MYLANTA) 200-200-20 MG/5ML suspension 30 mL  30 mL Oral Q4H PRN Emmaline Kluver, FNP      . hydrOXYzine (ATARAX/VISTARIL) tablet 25 mg  25 mg Oral TID PRN Emmaline Kluver, FNP      . LORazepam (ATIVAN) tablet 2 mg  2 mg Oral Q4H PRN Johnn Hai, MD       Or  . LORazepam (ATIVAN) injection 2 mg  2 mg Intramuscular Q4H PRN Johnn Hai, MD      . magnesium hydroxide (MILK OF MAGNESIA) suspension 30 mL  30 mL Oral Daily PRN Emmaline Kluver, FNP      . omega-3 acid ethyl esters (LOVAZA) capsule 1 g  1 g Oral BID Johnn Hai, MD   1 g at 04/28/19 0737  . risperiDONE (RISPERDAL) tablet 2 mg  2 mg Oral Daily Johnn Hai, MD   2 mg at 04/28/19 1101  . risperiDONE (RISPERDAL) tablet 6 mg  6 mg Oral QHS Connye Burkitt, NP   6 mg at 04/27/19 2119  . traZODone (DESYREL) tablet 150 mg  150 mg Oral QHS Emmaline Kluver, FNP   150 mg at 04/27/19 2119   PTA Medications: Medications Prior to Admission  Medication Sig Dispense Refill Last Dose  . risperiDONE (RISPERDAL) 3 MG tablet Take 1 tablet (3 mg total) by mouth at bedtime. 30 tablet 0   . traZODone (DESYREL) 150 MG tablet Take 1 tablet (150 mg total) by mouth at bedtime. 90 tablet 0     Patient Stressors: Legal issue Marital or family conflict Substance abuse  Patient Strengths: Special hobby/interest Supportive family/friends Work skills  Treatment Modalities: Medication Management, Group therapy, Case  management,  1 to 1 session with clinician, Psychoeducation, Recreational therapy.   Physician Treatment Plan for Primary Diagnosis: <principal problem not specified> Long Term Goal(s): Improvement in symptoms so as ready for discharge Improvement in symptoms so as ready for discharge   Short Term Goals: Ability to verbalize feelings will improve Ability to demonstrate self-control will improve Ability to identify and develop effective coping behaviors will improve Ability to maintain clinical measurements within normal limits will improve Compliance with prescribed medications will improve  Medication Management: Evaluate patient's response, side effects, and tolerance of medication regimen.  Therapeutic Interventions: 1 to 1 sessions, Unit Group sessions and Medication administration.  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  Therapeutic Interventions: 1 to 1 sessions, Unit Group sessions  and Medication administration.  Evaluation of Outcomes: Progressing   RN Treatment Plan for Primary Diagnosis: <principal problem not specified> Long Term Goal(s): Knowledge of disease and therapeutic regimen to maintain health will improve  Short Term Goals: Ability to participate in decision making will improve,  Ability to verbalize feelings will improve, Ability to disclose and discuss suicidal ideas, Ability to identify and develop effective coping behaviors will improve and Compliance with prescribed medications will improve  Medication Management: RN will administer medications as ordered by provider, will assess and evaluate patient's response and provide education to patient for prescribed medication. RN will report any adverse and/or side effects to prescribing provider.  Therapeutic Interventions: 1 on 1 counseling sessions, Psychoeducation, Medication administration, Evaluate responses to treatment, Monitor vital signs and CBGs as ordered, Perform/monitor CIWA, COWS, AIMS and Fall Risk screenings as ordered, Perform wound care treatments as ordered.  Evaluation of Outcomes: Progressing   LCSW Treatment Plan for Primary Diagnosis: <principal problem not specified> Long Term Goal(s): Safe transition to appropriate next level of care at discharge, Engage patient in therapeutic group addressing interpersonal concerns.  Short Term Goals: Engage patient in aftercare planning with referrals and resources and Increase skills for wellness and recovery  Therapeutic Interventions: Assess for all discharge needs, 1 to 1 time with Social worker, Explore available resources and support systems, Assess for adequacy in community support network, Educate family and significant other(s) on suicide prevention, Complete Psychosocial Assessment, Interpersonal group therapy.  Evaluation of Outcomes: Progressing   Progress in Treatment: Attending groups: Yes. Participating in groups: Yes. Taking medication as prescribed: Yes. Toleration medication: Yes. Family/Significant other contact made: No, will contact:  pt's legal guardian/sister Patient understands diagnosis: Yes. Discussing patient identified problems/goals with staff: Yes. Medical problems stabilized or resolved: Yes. Denies suicidal/homicidal  ideation: Yes. Issues/concerns per patient self-inventory: No. Other:   New problem(s) identified: No, Describe:  None  New Short Term/Long Term Goal(s): Medication stabilization, elimination of SI thoughts, and development of a comprehensive mental wellness plan.   Patient Goals:  "Getting the help I need and leave in a suitable way"   Discharge Plan or Barriers: Patient will be discharging back home and will be following up with [facility] for medication management.   Reason for Continuation of Hospitalization: Depression Medication stabilization  Estimated Length of Stay: 1-2 days   Attendees: Patient: Garrett Bolton 04/28/2019   Physician: Dr. Malvin Johns, MD  04/28/2019   Nursing:  04/28/2019   RN Care Manager: 04/28/2019   Social Worker: Stephannie Peters, LCSW  04/28/2019   Recreational Therapist:  04/28/2019  Other:  04/28/2019  Other:  04/28/2019   Other: 04/28/2019      Scribe for Treatment Team: Delphia Grates, LCSW 04/28/2019 12:45 PM

## 2019-04-28 NOTE — Progress Notes (Signed)
Recreation Therapy Notes  INPATIENT RECREATION THERAPY ASSESSMENT  Patient Details Name: Garrett Bolton MRN: [id number] DOB: Jan 07, 2001 Today's Date: 04/28/2019       Information Obtained From: Patient  Able to Participate in Assessment/Interview: Yes  Patient Presentation: Alert  Reason for Admission (Per Patient): Patient Unable to Identify, Other (Comments)(Pt stated someone said he was suicidal.)  Patient Stressors: (None identified)  Coping Skills:   Journal, TV, Sports, Arguments, Music, Exercise, Meditate, Deep Breathing, Talk, Art, Prayer, Avoidance, Read  Leisure Interests (2+):  Individual - Reading, Individual - Writing, Sports - Other (Comment)(Soccer)  Frequency of Recreation/Participation: Other (Comment)(Daily)  Awareness of Community Resources:  Yes  Community Resources:  [place], [place]  Current Use: Yes  If no, Barriers?:    Expressed Interest in [employer] Information: No  [place]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

## 2019-04-28 NOTE — [facility] Suicide Risk Assessment (Signed)
[facility] INPATIENT:  Family/Significant Other Suicide Prevention Education  Suicide Prevention Education:  Contact Attempts: Pt's legal guardian/sister, Garrett Bolton [phone]), has been identified by the patient as the family member/significant other with whom the patient will be residing, and identified as the person(s) who will aid the patient in the event of a mental health crisis.  With written consent from the patient, two attempts were made to provide suicide prevention education, prior to and/or following the patient's discharge.  We were unsuccessful in providing suicide prevention education.  A suicide education pamphlet was given to the patient to share with family/significant other.  Date and time of first attempt: 04/28/2019 @ 2:50pm (CSW left a HIPPA compliant voice) Date and time of second attempt:  Garrett Bolton 04/28/2019, 3:00 PM

## 2019-04-28 NOTE — Plan of Care (Signed)
Progress note  D: pt found in bed; compliant with medication administration. Pt rates their depression/anxiety/hopelessness a 0/0/0 out of 10 respectively. Pt has no complaints of physical complaints. Pt has become increasing labile today regarding discharge. Pt provided education, no evidence of learning. Pt states their goal for today is to get discharged and will achieve this by staying humbled. Pt denies si/hi/ah/vh and verbally agrees to approach staff if these become apparent or before harming themself/others while at [facility].  A: Pt provided support and encouragement. Pt given medication per protocol and standing orders. Q66m safety checks implemented and continued.  R: Pt safe on the unit. Will continue to monitor.  Pt progressing in the following metrics  Problem: Activity: Goal: Will identify at least one activity in which they can participate Outcome: Progressing   Problem: Coping: Goal: Ability to identify and develop effective coping behavior will improve Outcome: Progressing Goal: Ability to interact with others will improve Outcome: Progressing

## 2019-04-29 MED ORDER — OMEGA-3-ACID ETHYL ESTERS 1 G PO CAPS: 1.0000 g | ORAL_CAPSULE | Freq: Two times a day (BID) | ORAL | 0 refills | Status: DC

## 2019-04-29 MED ORDER — RISPERIDONE 2 MG PO TABS: ORAL_TABLET | ORAL | 0 refills | Status: DC

## 2019-04-29 MED ORDER — BENZTROPINE MESYLATE 1 MG PO TABS
1.0000 mg | ORAL_TABLET | Freq: Every day | ORAL | Status: DC
  Filled 2019-04-29 (×2): qty 7

## 2019-04-29 MED ORDER — PALIPERIDONE PALMITATE ER 234 MG/1.5ML IM SUSY
234.0000 mg | PREFILLED_SYRINGE | Freq: Once | INTRAMUSCULAR | Status: AC
  Administered 2019-04-29: 234 mg via INTRAMUSCULAR
  Filled 2019-04-29: qty 1.5

## 2019-04-29 MED ORDER — PALIPERIDONE PALMITATE ER 234 MG/1.5ML IM SUSY: 234.0000 mg | PREFILLED_SYRINGE | Freq: Once | INTRAMUSCULAR | 1 refills | Status: DC

## 2019-04-29 MED ORDER — BENZTROPINE MESYLATE 1 MG PO TABS: 1.0000 mg | ORAL_TABLET | Freq: Every day | ORAL | 0 refills | Status: DC

## 2019-04-29 MED ORDER — OMEGA-3-ACID ETHYL ESTERS 1 G PO CAPS: 1.0000 g | ORAL_CAPSULE | Freq: Two times a day (BID) | ORAL | 11 refills | Status: DC

## 2019-04-29 MED ORDER — BENZTROPINE MESYLATE 1 MG PO TABS: 1.0000 mg | ORAL_TABLET | Freq: Every day | ORAL | 2 refills | Status: DC

## 2019-04-29 MED ORDER — RISPERIDONE 2 MG PO TABS: ORAL_TABLET | ORAL | 2 refills | Status: DC

## 2019-04-29 NOTE — Progress Notes (Signed)
Recreation Therapy Notes  Date: 2.9.21 Time: 0940 Location: [address] Dayroom  Group Topic:  Goal Setting  Goal Area(s) Addresses:  Patient will identify goals they wish to set. Patient will identify benefit of setting goals. Patient will identify benefit of pursuing goals post d/c.  Behavioral Response:  Engaged  Intervention: Worksheet  Activity:  Garment/textile technologist.  Patient will identify goals they wish to accomplish in week, month, year and 5 years.  Patient then identifies any obstacles to reaching goals, what they need to achieve goals and what they can start doing now to work toward goals.   Education:  Discharge Planning, Coping Skills, Goal Planning  Education Outcome: Acknowledges Education/In Group Clarification Provided/Needs Additional Education  Clinical Observations: Pt stated in a week, he wants to keep working on his diploma, save up money and become deeper in christ.  In a month, pt wants to have drivers license.  In a year, get a better paying job and have his own place.  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  Pt identified obstacles as studying, remain humble and stay focused.  Pt can achieve goals by "understanding things don't go as planned have an A, B and C plans.  Pt expressed he can start tomorrow by working on his high school diploma.    Caroll Rancher , LRT/CTRS    Caroll Rancher A 04/29/2019 10:54 AM
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
## 2019-04-29 NOTE — Progress Notes (Signed)
  [facility] Adult Case Management Discharge Plan :  Will you be returning to the same living situation after discharge:  Yes,  home At discharge, do you have transportation home?: Yes,  pt's sister at 11:30am Do you have the ability to pay for your medications: Yes,  private insurance  Release of information consent forms completed and in the chart;  Patient's signature needed at discharge.  Patient to Follow up at: Follow-up Information    [facility] Follow up on 05/05/2019.   Why: You are scheduled for an appointment on 05/05/19 at 9:00 am.  This will be a virtual appointment.  Please have your insurance information and your discharge summary available.  Contact information: [address] [place] [address] [phone]           Next level of care provider has access to [facility] Link:no  Safety Planning and Suicide Prevention discussed: Yes,  pt's sister/legal guardian     Has patient been referred to the Quitline?: N/A patient is not a smoker  Patient has been referred for addiction treatment: Yes  Delphia Grates, LCSW 04/29/2019, 10:14 AM

## 2019-04-29 NOTE — [facility] Suicide Risk Assessment (Signed)
[facility] INPATIENT:  Family/Significant Other Suicide Prevention Education  Suicide Prevention Education:  Education Completed; Pt's sister/legal guardian, Garrett Bolton,  has been identified by the patient as the family member/significant other with whom the patient will be residing, and identified as the person(s) who will aid the patient in the event of a mental health crisis (suicidal ideations/suicide attempt).  With written consent from the patient, the family member/significant other has been provided the following suicide prevention education, prior to the and/or following the discharge of the patient.  The suicide prevention education provided includes the following:  Suicide risk factors  Suicide prevention and interventions  National Suicide Hotline telephone number  [facility] assessment telephone number  [facility] Emergency Assistance 911  [facility] and/or Residential Mobile Crisis Unit telephone number  Request made of family/significant other to:  Remove weapons (e.g., guns, rifles, knives), all items previously/currently identified as safety concern.    Remove drugs/medications (over-the-counter, prescriptions, illicit drugs), all items previously/currently identified as a safety concern.  The family member/significant other verbalizes understanding of the suicide prevention education information provided.  The family member/significant other agrees to remove the items of safety concern listed above.   CSW contacted pt's sister/legal guardian, Garrett Bolton. Pt's sister asked about patient's medication and aftercare. Pt's sister asked if she needs to not talk to the patient about anything. Pt's sister stated that she will come get him at 11:30am.   CSW attempted to call pt's mother, Garrett Bolton, but was unable to.   Delphia Grates 04/29/2019, 9:34 AM

## 2019-04-29 NOTE — Progress Notes (Signed)
Patient ID: Garrett Bolton, male   DOB: 06-Dec-2000, 19 y.o.   MRN: [id number] Patient discharged to home/self care in the presence of family.  Patient denies SI, HI and AVH upon  discharge and reports reduction of symptoms.  Patient acknowledged understanding of all discharge instructions and receipt of personal belongings.

## 2019-04-29 NOTE — [facility] Suicide Risk Assessment (Signed)
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 rate 18, height 5\' 9"  (1.753 m), weight 63.5 kg, SpO2 100 %.Body mass index is 20.67 kg/m.  General Appearance: Casual  Eye Contact::  Good  Speech:  Clear and Coherent  Volume:  Normal  Mood:  Euthymic  Affect:  Restricted  Thought Process:  Goal Directed  Orientation:  Full (Time, Place, and Person)  Thought Content:  Paranoid Ideation and Still paranoid and the police people work against him however there is no specific form delusion there is less conviction and intensity no plans to act on these delusions and no hallucinations reported or discerned  Suicidal Thoughts:  No  Homicidal Thoughts:  No  Memory:  Immediate;   Fair Recent;   Fair Remote;   Fair  Judgement:  Fair  Insight:  Fair  Psychomotor Activity:  Normal  Concentration:  Fair  Recall:  [phone] of Knowledge:Fair  Language: Fair  Akathisia:  Negative  Handed:  Right  AIMS (if indicated):     Assets:  Communication Skills Desire for Improvement  Sleep:  Number of Hours: 5.75  Cognition: WNL  ADL's:  Intact   Mental Status Per Nursing Assessment::   On Admission:  NA  Demographic Factors:  Unemployed  Loss Factors: Decrease in vocational status  Historical Factors: NA  Risk Reduction Factors:   Sense of responsibility to family, Religious beliefs about death and Living with another person, especially  a relative  Continued Clinical Symptoms:  Alcohol/Substance Abuse/Dependencies Schizophrenia:   Less than 14 years old  Cognitive Features That Contribute To Risk:  Loss of executive function    Suicide Risk:  Minimal: No identifiable suicidal ideation.  Patients presenting with no risk factors but with morbid ruminations; may be classified as minimal risk based on the severity of the depressive symptoms  Follow-up Information    [facility] Follow up on 05/05/2019.   Why: You are scheduled for an appointment on 05/05/19 at 9:00 am.  This will be a virtual appointment.  Please have your insurance information and your discharge summary available.  Contact information: [address] [place] [phone]           Plan Of Care/Follow-up recommendations:  Activity:  full  Liel Rudden, MD 04/29/2019, 9:37 AM

## 2019-05-04 ENCOUNTER — Observation Stay (HOSPITAL_COMMUNITY)
Admission: AD | Admit: 2019-05-04 | Discharge: 2019-05-05 | Disposition: A | Discharge status: Home / Self Care | Payer: Federal, State, Local not specified - PPO | Attending: Psychiatry | Admitting: Psychiatry

## 2019-05-04 ENCOUNTER — Encounter (HOSPITAL_COMMUNITY): Payer: Self-pay | Admitting: Nurse Practitioner

## 2019-05-04 ENCOUNTER — Other Ambulatory Visit: Payer: Self-pay

## 2019-05-04 DIAGNOSIS — F209 Schizophrenia, unspecified: Principal | ICD-10-CM | POA: Insufficient documentation

## 2019-05-04 DIAGNOSIS — D573 Sickle-cell trait: Secondary | ICD-10-CM | POA: Diagnosis not present

## 2019-05-04 DIAGNOSIS — Z79899 Other long term (current) drug therapy: Secondary | ICD-10-CM | POA: Insufficient documentation

## 2019-05-04 DIAGNOSIS — F200 Paranoid schizophrenia: Secondary | ICD-10-CM | POA: Diagnosis present

## 2019-05-04 DIAGNOSIS — F12951 Cannabis use, unspecified with psychotic disorder with hallucinations: Secondary | ICD-10-CM | POA: Diagnosis not present

## 2019-05-04 MED ORDER — MAGNESIUM HYDROXIDE 400 MG/5ML PO SUSP: 30.0000 mL | Freq: Every day | ORAL | Status: DC | PRN

## 2019-05-04 MED ORDER — OMEGA-3-ACID ETHYL ESTERS 1 G PO CAPS
1.0000 g | ORAL_CAPSULE | Freq: Two times a day (BID) | ORAL | Status: DC
  Administered 2019-05-04 – 2019-05-05 (×2): 1 g via ORAL
  Filled 2019-05-04 (×2): qty 1

## 2019-05-04 MED ORDER — RISPERIDONE 3 MG PO TABS
6.0000 mg | ORAL_TABLET | Freq: Every day | ORAL | Status: DC
  Administered 2019-05-04: 6 mg via ORAL
  Filled 2019-05-04: qty 6

## 2019-05-04 MED ORDER — ALUM & MAG HYDROXIDE-SIMETH 200-200-20 MG/5ML PO SUSP: 30.0000 mL | ORAL | Status: DC | PRN

## 2019-05-04 MED ORDER — BENZTROPINE MESYLATE 1 MG PO TABS: 1.0000 mg | ORAL_TABLET | Freq: Every day | ORAL | Status: DC

## 2019-05-04 MED ORDER — BENZTROPINE MESYLATE 1 MG PO TABS
1.0000 mg | ORAL_TABLET | Freq: Every day | ORAL | Status: DC
  Administered 2019-05-04 – 2019-05-05 (×2): 1 mg via ORAL
  Filled 2019-05-04 (×2): qty 1

## 2019-05-04 MED ORDER — ACETAMINOPHEN 325 MG PO TABS: 650.0000 mg | ORAL_TABLET | Freq: Four times a day (QID) | ORAL | Status: DC | PRN

## 2019-05-04 NOTE — H&P (Signed)
[facility] Observation Unit Provider Admission PAA/H&P  Patient Identification: Garrett Bolton MRN:  [id number] Date of Evaluation:  05/04/2019 Chief Complaint:  Schizophrenia (HCC) [F20.9] Principal Diagnosis: Cannabis-induced psychotic disorder with hallucinations (HCC) Diagnosis:  Principal Problem:   Cannabis-induced psychotic disorder with hallucinations (HCC)  History of Present Illness:  TTS Assessment:  Garrett Bolton is an 19 y.o. single male who presents unaccompanied to [facility] via law enforcement after being petitioned for involuntary commitment by his sister, Covey Baller [phone]. Affidavit and petition states: "Respondent has mental diagnosis of psychotic disorder. He is currently having a manic episode. He isn't sleeping, tending to personal hygiene, or taking meds as prescribed. Respondent stated that he didn not wish to live anymore. He is having conversations with people that aren't there. He believes everyone is out to get him. Respondent walks up to random people trying to fight."  Pt was inpatient at [facility] 02/05-02/09/21 due to psychotic symptoms. He says he doesn't understand why his sister petitioned for his involuntary commitment again. He says he was home alone most of the day doing school work and then Patent examiner arrived. Pt states, "I know what she put on that paper but none of it is true." He denies experiencing auditory or visual hallucinations. He denies any suicidal ideation or history of suicide attempts. He denies problems with sleep or appetite. He denies thoughts of harming others or trying to initiate fights with people. He denies using alcohol, marijuana or other substances since leaving the hospital.   He says he lives with his sister and says he has several family members, including his mother, who are supportive. He says he is scheduled to start a new job tomorrow. He reports he is in the 12th grade at [employer]. He says he is "on house arrest" and  has a court date pending. When asked what he has been charged with, Pt says he would rather not disclose that. He acknowledges he has not taken his psychiatric medications since leaving the hospital. He states he has numbers to call for follow up appointments with mental health providers.  TTS contacted petitioner/sister [name] Eisner [phone] for collateral information. She says Pt still appears to be experiencing auditory hallucinations of hearing voices. She says he "has no sense of reality" and believes people are trying to harm him. She says [name] saw Pt today, said Pt "isn't like himself", and encouraged her to petition for involuntary commitment. She says Pt used alcohol and marijuana four days ago. She says Pt went into a store today and was trying to fight with people in the store. When asked about Pt being suicidal, she says they live in a dangerous neighborhood and Pt's aggressive behavior places him in danger.   Evaluation on Unit: Reviewed TTS assessment and validated with patient. On evaluation patient is alert and oriented x 4, pleasant, and cooperative. Speech is clear and coherent. Mood is euthymic. Affect is blunt, but reactive at times.  Thought process is coherent and thought content is logical. Denies hallucinations and paranoia. No indication that the patient is responding to internal stimuli. No evidence of delusional thought content. Denies suicidal ideations. Denies homicidal ideations. Reports marijuana use. Denies use of other substances.   He reports that he did not pick up his prescription for Risperdal after discharge. He is in agreement with resuming Risperdal tonight.   Patient appears to be improving based on previous encounters with him. However, based on IVC and collateral collected from his  sister, we will observe overnight.   Associated Signs/Symptoms:  Depression Symptoms:  Denies symptoms of depression (Hypo) Manic Symptoms:  Per collateral,  impulsive, irritiable. Patient denies. Anxiety Symptoms:  Denies Psychotic Symptoms:  Per collateral, patient appers to be responding to halluciantions. Patient deneis hallucaintions, paranoia. No evidence of parnoia or delusional thoguht content at this time. PTSD Symptoms: Negative Total Time spent with patient: 45 minutes  Past Psychiatric History: Cannabis induced psychotic disorder.   Is the patient at risk to self? No.  Has the patient been a risk to self in the past 6 months? No.  Has the patient been a risk to self within the distant past? No.  Is the patient a risk to others? No.  Has the patient been a risk to others in the past 6 months? No.  Has the patient been a risk to others within the distant past? No.   Prior Inpatient Therapy: Prior Inpatient Therapy: Yes Prior Therapy Dates: 04/2019 Prior Therapy Facilty/Provider(s): [facility] Reason for Treatment: Psychosis Prior Outpatient Therapy: Prior Outpatient Therapy: No Does patient have an ACCT team?: No Does patient have Intensive In-House Services?  : No Does patient have [facility] services? : Yes Does patient have P4CC services?: No  Alcohol Screening:   Substance Abuse History in the last 12 months:  Yes.   Consequences of Substance Abuse: Negative Previous Psychotropic Medications: Yes  Psychological Evaluations: No  Past Medical History:  Past Medical History:  Diagnosis Date  . Anemia   . Sickle cell trait [facility])     Past Surgical History:  Procedure Laterality Date  . CIRCUMCISION  2001   Family History: No family history on file. Family Psychiatric History: Unknown Tobacco Screening:   Social History:  Social History   Substance and Sexual Activity  Alcohol Use No     Social History   Substance and Sexual Activity  Drug Use No    Additional Social History: Marital status: Single    Pain Medications: Denies abuse Prescriptions: Denies abuse Over the Counter: Denies abuse History of  alcohol / drug use?: Yes Longest period of sobriety (when/how long): Unknown Negative Consequences of Use: Financial, Legal, Personal relationships, Work / School Name of Substance 1: Marijuana 1 - Age of First Use: unknown 1 - Amount (size/oz): unknown 1 - Frequency: unknown 1 - Duration: unknown 1 - Last Use / Amount: Sister reports Pt used 3 days ago                  Allergies:  No Known Allergies Lab Results: No results found for this or any previous visit (from the past 48 hour(s)).  Blood Alcohol level:  Lab Results  Component Value Date   ETH <10 04/24/2019    Metabolic Disorder Labs:  Lab Results  Component Value Date   HGBA1C 4.3 (L) 04/21/2019   MPG 76.71 04/21/2019   Lab Results  Component Value Date   PROLACTIN 15.2 04/21/2019   Lab Results  Component Value Date   CHOL 153 04/21/2019   TRIG 53 04/21/2019   HDL 52 04/21/2019   CHOLHDL 2.9 04/21/2019   VLDL 11 04/21/2019   LDLCALC 90 04/21/2019    Current Medications: Current Facility-Administered Medications  Medication Dose Route Frequency Provider Last Rate Last Admin  . acetaminophen (TYLENOL) tablet 650 mg  650 mg Oral Q6H PRN Nira Conn A, NP      . alum & mag hydroxide-simeth (MAALOX/MYLANTA) 200-200-20 MG/5ML suspension 30 mL  30 mL Oral Q4H  PRN Jackelyn Poling, NP      . benztropine (COGENTIN) tablet 1 mg  1 mg Oral Daily Nira Conn A, NP   1 mg at 05/04/19 2159  . magnesium hydroxide (MILK OF MAGNESIA) suspension 30 mL  30 mL Oral Daily PRN Nira Conn A, NP      . omega-3 acid ethyl esters (LOVAZA) capsule 1 g  1 g Oral BID Nira Conn A, NP   1 g at 05/04/19 2159  . risperiDONE (RISPERDAL) tablet 6 mg  6 mg Oral QHS Nira Conn A, NP   6 mg at 05/04/19 2159   PTA Medications: Medications Prior to Admission  Medication Sig Dispense Refill Last Dose  . benztropine (COGENTIN) 1 MG tablet Take 1 tablet (1 mg total) by mouth daily. 30 tablet 0   . omega-3 acid ethyl esters (LOVAZA) 1  g capsule Take 1 capsule (1 g total) by mouth 2 (two) times daily. 60 capsule 0   . paliperidone (INVEGA SUSTENNA) 234 MG/1.5ML SUSY injection Inject 234 mg into the muscle once for 1 dose. Due approx 3/6 1.5 mL 1   . risperiDONE (RISPERDAL) 2 MG tablet 3 at hs x 10 days the 2 at hs then on 70 tablet 0     Musculoskeletal: Strength & Muscle Tone: within normal limits Gait & Station: normal Patient leans: N/A  Psychiatric Specialty Exam: Physical Exam  Constitutional: He is oriented to person, place, and time. He appears well-developed and well-nourished. No distress.  HENT:  Head: Normocephalic and atraumatic.  Right Ear: External ear normal.  Left Ear: External ear normal.  Eyes: Pupils are equal, round, and reactive to light. Right eye exhibits no discharge. Left eye exhibits no discharge.  Cardiovascular: Normal rate.  Respiratory: Effort normal. No respiratory distress.  Musculoskeletal:        General: Normal range of motion.  Neurological: He is alert and oriented to person, place, and time.  Skin: Skin is warm and dry. He is not diaphoretic.  Psychiatric: His mood appears not anxious. He is not withdrawn and not actively hallucinating. Thought content is not paranoid and not delusional. He does not exhibit a depressed mood. He expresses no homicidal and no suicidal ideation.    Review of Systems  Constitutional: Negative for activity change, appetite change, chills, diaphoresis, fatigue, fever and unexpected weight change.  Respiratory: Negative for cough and shortness of breath.   Cardiovascular: Negative for chest pain.  Gastrointestinal: Negative for diarrhea, nausea and vomiting.  Musculoskeletal: Negative.   Skin: Negative.   Psychiatric/Behavioral: Negative for decreased concentration, dysphoric mood, hallucinations, sleep disturbance and suicidal ideas. The patient is not nervous/anxious.     Blood pressure (!) 141/87, pulse 98, temperature 97.7 F (36.5 C),  temperature source Oral, resp. rate 20, SpO2 100 %.There is no height or weight on file to calculate BMI.  General Appearance: Well Groomed  Eye Contact:  Good  Speech:  Clear and Coherent and Normal Rate  Volume:  Normal  Mood:  Euthymic  Affect:  Appropriate  Thought Process:  Coherent, Goal Directed, Linear and Descriptions of Associations: Intact  Orientation:  Full (Time, Place, and Person)  Thought Content:  Logical and Hallucinations: Denies. No indication that he is responding to internal stimuli.  Suicidal Thoughts:  No  Homicidal Thoughts:  No  Memory:  Immediate;   Good Recent;   Good  Judgement:  Intact  Insight:  Fair  Psychomotor Activity:  Normal  Concentration:  Concentration: Good  Recall:  Good  Fund of Knowledge:  Good  Language:  Good  Akathisia:  Negative  Handed:  Right  AIMS (if indicated):     Assets:  Agricultural consultant Housing Leisure Time Physical Health  ADL's:  Intact  Cognition:  WNL  Sleep:         Treatment Plan Summary: Daily contact with patient to assess and evaluate symptoms and progress in treatment and Medication management  Observation Level/Precautions:  15 minute checks Laboratory:  CBC Chemistry Profile UDS Psychotherapy:  Individual Medications:   Risperdal 6 mg (Risperdal is being tapered) Cogentin 1 mg daily for EPS Lovaza 1 gm BID for neuro protection  Consultations:  Social work Discharge Concerns:  Safety, medication management Estimated LOS:<24 hours Other:      Rozetta Nunnery, NP 2/14/202110:11 PM

## 2019-05-04 NOTE — Plan of Care (Signed)
[facility] Observation Crisis Plan  Reason for Crisis Plan:  Crisis Stabilization   Plan of Care:  Referral for Inpatient Hospitalization  Family Support:      Current Living Environment:  Living Arrangements: Other relatives  Insurance:   Hospital Account    Name Acct ID Class Status Primary Coverage   Garrett Bolton, Garrett Bolton [id number] BEHAVIORAL HEALTH OBSERVATION Open BLUE CROSS BLUE SHIELD - BCBS/[employer]ED[employer] PPO        Guarantor Account (for Hospital Account [id number])    Name Relation to Pt Service Area Active? Acct Type   Garrett Bolton Self [facility] Yes [facility]   Address Phone       [address] [place], [place] [address] [phone](H)          Coverage Information (for Hospital Account [id number])    1. BLUE CROSS BLUE SHIELD/BCBS/FEDERAL EMP PPO    F/O Payor/Plan Precert #   BLUE CROSS BLUE SHIELD/BCBS/FED[employer]AL EMP PPO    Subscriber Subscriber #   Garrett Bolton [id number]   Address Phone   [address] [place], [place] [address] [phone]       2. [place]    F/O Payor/Plan Precert #   [facility] MEDICAID/[place]    Subscriber Subscriber #   Garrett Bolton, Garrett Bolton [id number] P   Address Phone   [address] [place], [place] [address] [phone]          Legal Guardian:  Legal Guardian: Other:(Self)  Primary Care Provider:  [name], MD  Current Outpatient Providers:  Albina Billet. MD  Psychiatrist:  Name of Psychiatrist: Vesta Mixer  Counselor/Therapist:  Name of Therapist: Vesta Mixer  Compliant with Medications:  No  Additional Information:   Garrett Bolton 2/14/202110:37 PM

## 2019-05-04 NOTE — Progress Notes (Signed)
Patient ID: Garrett Bolton, male   DOB: Nov 19, 2000, 19 y.o.   MRN: [id number] Pt presents under IVC, accompanied by [employer], wearing an ankle monitor.  Family report pt experiencing auditory hallucinations, believes people are trying to hurt him.  Pt denies symptoms.  A&O x 4, no distress noted, slightly anxious and cooperative, but guarded.  Monitoring for safety.  Skin search completed.  Pending COVID results.
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
## 2019-05-05 DIAGNOSIS — F12951 Cannabis use, unspecified with psychotic disorder with hallucinations: Secondary | ICD-10-CM | POA: Diagnosis not present

## 2019-05-05 DIAGNOSIS — F209 Schizophrenia, unspecified: Secondary | ICD-10-CM | POA: Diagnosis not present

## 2019-05-05 NOTE — BH Assessment (Addendum)
[facility] Assessment Progress Note  Per Landry Mellow, MD, this pt does not require psychiatric hospitalization at this time.  Pt presents under IVC initiated by pt's sister, which Dr Jola Babinski has rescinded.  Pt is to be discharged from [facility] with recommendation to follow up with [facility].  This has been included in pt's discharge instructions.  Pt's nurse, Casimiro Needle, has been notified.  Doylene Canning, MA Triage Specialist [phone]  Addendum:  After further consultation with Dr Jola Babinski, it has been determined that pt would benefit from ACT Team services provided by [facility].  Discharge instructions have been modified to include this, and pt's nurse has been notified.  At 10:52 this writer called [facility] to notify them.  Call rolled to voice mail and I left a message.  As of this writing, return call is pending.  Doylene Canning, [place] Behavioral Health Coordinator [phone]

## 2019-05-05 NOTE — Progress Notes (Signed)
Discharge note  Patient verbalizes readiness for discharge. Follow up plan explained, AVS given. Prescriptions and teaching provided. Belongings returned and signed for. Patient verbalizes understanding. Patient denies SI/HI and assures this Clinical research associate they will seek assistance should that change. Patient discharged to lobby to wait for uber.
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
## 2019-05-05 NOTE — Discharge Instructions (Addendum)
For your mental health needs, you are advised to follow up with [facility].  Call them at your earliest opportunity to schedule an appointment.  You have been recommended for ACT Team services, also provided by [name].  This an enhanced level of care that includes in-home visits and more frequent contact with your providers.  Ask Vesta Mixer about this service when you contact them:       [facility]      [address]      [place], [place] [address]      [phone]      Crisis number: [phone]

## 2019-05-05 NOTE — [facility]HH Suicide Risk Assessment (Signed)
[facility] Discharge Suicide Risk Assessment   Principal Problem: Cannabis-induced psychotic disorder with hallucinations (HCC) Discharge Diagnoses: Principal Problem:   Cannabis-induced psychotic disorder with hallucinations (HCC)   Total Time spent with patient: 30 minutes  Musculoskeletal: Strength & Muscle Tone: within normal limits Gait & Station: normal Patient leans: N/A  Psychiatric Specialty Exam: Review of Systems  All other systems reviewed and are negative.   Blood pressure 128/73, pulse (!) 101, temperature 98.2 F (36.8 C), temperature source Oral, resp. rate 16, SpO2 100 %.There is no height or weight on file to calculate BMI.  General Appearance: Casual  Eye Contact::  Fair  Speech:  Normal Rate409  Volume:  Decreased  Mood:  Anxious  Affect:  Congruent  Thought Process:  Coherent and Descriptions of Associations: Intact  Orientation:  Full (Time, Place, and Person)  Thought Content:  Logical  Suicidal Thoughts:  No  Homicidal Thoughts:  No  Memory:  Immediate;   Fair Recent;   Fair Remote;   Fair  Judgement:  Intact  Insight:  Fair  Psychomotor Activity:  Normal  Concentration:  Fair  Recall:  [employer] of Knowledge:Fair  Language: Good  Akathisia:  Negative  Handed:  Right  AIMS (if indicated):     Assets:  Desire for Improvement Resilience  Sleep:     Cognition: WNL  ADL's:  Intact   Mental Status Per Nursing Assessment::   On Admission:  Suicidal ideation indicated by others  Demographic Factors:  Male, Adolescent or young adult and Low socioeconomic status  Loss Factors: Relational problems with sister and sister's boyfriend.  Historical Factors: Impulsivity  Risk Reduction Factors:   Positive coping skills or problem solving skills  Continued Clinical Symptoms:  Alcohol/Substance Abuse/Dependencies Schizophrenia:   Less than 35 years old  Cognitive Features That Contribute To Risk:  None    Suicide Risk:  Minimal: No  identifiable suicidal ideation.  Patients presenting with no risk factors but with morbid ruminations; may be classified as minimal risk based on the severity of the depressive symptoms    Plan Of Care/Follow-up recommendations:  Activity:  ad lib  Antonieta Pert, MD 05/05/2019, 9:57 AM

## 2019-08-22 ENCOUNTER — Emergency Department (HOSPITAL_COMMUNITY): Payer: Federal, State, Local not specified - PPO | Admitting: Certified Registered Nurse Anesthetist

## 2019-08-22 ENCOUNTER — Inpatient Hospital Stay (HOSPITAL_COMMUNITY)
Admission: EM | Admit: 2019-08-22 | Discharge: 2019-08-26 | DRG: 674 | Disposition: A | Discharge status: Home / Self Care | Payer: Federal, State, Local not specified - PPO | Attending: General Surgery | Admitting: General Surgery

## 2019-08-22 ENCOUNTER — Encounter (HOSPITAL_COMMUNITY): Payer: Self-pay | Admitting: Emergency Medicine

## 2019-08-22 ENCOUNTER — Other Ambulatory Visit: Payer: Self-pay

## 2019-08-22 ENCOUNTER — Encounter (HOSPITAL_COMMUNITY): Payer: Self-pay

## 2019-08-22 ENCOUNTER — Emergency Department (HOSPITAL_COMMUNITY): Payer: Federal, State, Local not specified - PPO

## 2019-08-22 ENCOUNTER — Emergency Department (HOSPITAL_COMMUNITY)
Admission: EM | Admit: 2019-08-22 | Discharge: 2019-08-22 | Discharge status: Against Medical Advice | Payer: Federal, State, Local not specified - PPO | Source: Home / Self Care | Attending: Emergency Medicine | Admitting: Emergency Medicine

## 2019-08-22 ENCOUNTER — Encounter (HOSPITAL_COMMUNITY)
Admission: EM | Discharge status: Against Medical Advice | Payer: Self-pay | Source: Home / Self Care | Attending: Emergency Medicine

## 2019-08-22 DIAGNOSIS — Y939 Activity, unspecified: Secondary | ICD-10-CM | POA: Insufficient documentation

## 2019-08-22 DIAGNOSIS — Z9114 Patient's other noncompliance with medication regimen: Secondary | ICD-10-CM

## 2019-08-22 DIAGNOSIS — Y929 Unspecified place or not applicable: Secondary | ICD-10-CM | POA: Insufficient documentation

## 2019-08-22 DIAGNOSIS — R45851 Suicidal ideations: Secondary | ICD-10-CM | POA: Diagnosis present

## 2019-08-22 DIAGNOSIS — Z20822 Contact with and (suspected) exposure to covid-19: Secondary | ICD-10-CM | POA: Insufficient documentation

## 2019-08-22 DIAGNOSIS — Z79899 Other long term (current) drug therapy: Secondary | ICD-10-CM | POA: Insufficient documentation

## 2019-08-22 DIAGNOSIS — R110 Nausea: Secondary | ICD-10-CM | POA: Diagnosis not present

## 2019-08-22 DIAGNOSIS — S21242A Puncture wound with foreign body of left back wall of thorax without penetration into thoracic cavity, initial encounter: Secondary | ICD-10-CM | POA: Insufficient documentation

## 2019-08-22 DIAGNOSIS — F4325 Adjustment disorder with mixed disturbance of emotions and conduct: Secondary | ICD-10-CM | POA: Diagnosis present

## 2019-08-22 DIAGNOSIS — M795 Residual foreign body in soft tissue: Secondary | ICD-10-CM

## 2019-08-22 DIAGNOSIS — S31041A Puncture wound with foreign body of lower back and pelvis with penetration into retroperitoneum, initial encounter: Principal | ICD-10-CM | POA: Diagnosis present

## 2019-08-22 DIAGNOSIS — W3400XA Accidental discharge from unspecified firearms or gun, initial encounter: Secondary | ICD-10-CM

## 2019-08-22 DIAGNOSIS — D573 Sickle-cell trait: Secondary | ICD-10-CM | POA: Diagnosis present

## 2019-08-22 DIAGNOSIS — F1290 Cannabis use, unspecified, uncomplicated: Secondary | ICD-10-CM | POA: Diagnosis present

## 2019-08-22 DIAGNOSIS — W34010A Accidental discharge of airgun, initial encounter: Secondary | ICD-10-CM

## 2019-08-22 DIAGNOSIS — F209 Schizophrenia, unspecified: Secondary | ICD-10-CM | POA: Insufficient documentation

## 2019-08-22 DIAGNOSIS — R140 Abdominal distension (gaseous): Secondary | ICD-10-CM | POA: Diagnosis not present

## 2019-08-22 DIAGNOSIS — Y999 Unspecified external cause status: Secondary | ICD-10-CM | POA: Insufficient documentation

## 2019-08-22 DIAGNOSIS — Y249XXA Unspecified firearm discharge, undetermined intent, initial encounter: Secondary | ICD-10-CM

## 2019-08-22 DIAGNOSIS — F329 Major depressive disorder, single episode, unspecified: Secondary | ICD-10-CM | POA: Diagnosis present

## 2019-08-22 DIAGNOSIS — S21432A Puncture wound without foreign body of left back wall of thorax with penetration into thoracic cavity, initial encounter: Secondary | ICD-10-CM

## 2019-08-22 DIAGNOSIS — S31609A Unspecified open wound of abdominal wall, unspecified quadrant with penetration into peritoneal cavity, initial encounter: Secondary | ICD-10-CM

## 2019-08-22 DIAGNOSIS — Z5320 Procedure and treatment not carried out because of patient's decision for unspecified reasons: Secondary | ICD-10-CM | POA: Insufficient documentation

## 2019-08-22 DIAGNOSIS — Z9889 Other specified postprocedural states: Secondary | ICD-10-CM

## 2019-08-22 LAB — CBC WITH DIFFERENTIAL/PLATELET
Abs Immature Granulocytes: 0.1 10*3/uL — ABNORMAL HIGH (ref 0.00–0.07)
Basophils Absolute: 0 10*3/uL (ref 0.0–0.1)
Basophils Relative: 0 %
Eosinophils Absolute: 0.1 10*3/uL (ref 0.0–0.5)
Eosinophils Relative: 1 %
HCT: 38.3 % — ABNORMAL LOW (ref 39.0–52.0)
Hemoglobin: 11.6 g/dL — ABNORMAL LOW (ref 13.0–17.0)
Immature Granulocytes: 1 %
Lymphocytes Relative: 12 %
Lymphs Abs: 1.6 10*3/uL (ref 0.7–4.0)
MCH: 23.4 pg — ABNORMAL LOW (ref 26.0–34.0)
MCHC: 30.3 g/dL (ref 30.0–36.0)
MCV: 77.2 fL — ABNORMAL LOW (ref 80.0–100.0)
Monocytes Absolute: 0.7 10*3/uL (ref 0.1–1.0)
Monocytes Relative: 6 %
Neutro Abs: 10.9 10*3/uL — ABNORMAL HIGH (ref 1.7–7.7)
Neutrophils Relative %: 80 %
Platelets: 252 10*3/uL (ref 150–400)
RBC: 4.96 MIL/uL (ref 4.22–5.81)
RDW: 13.2 % (ref 11.5–15.5)
WBC: 13.4 10*3/uL — ABNORMAL HIGH (ref 4.0–10.5)
nRBC: 0 % (ref 0.0–0.2)

## 2019-08-22 LAB — COMPREHENSIVE METABOLIC PANEL
ALT: 21 U/L (ref 0–44)
AST: 27 U/L (ref 15–41)
Albumin: 4.1 g/dL (ref 3.5–5.0)
Alkaline Phosphatase: 55 U/L (ref 38–126)
Anion gap: 13 (ref 5–15)
BUN: 6 mg/dL (ref 6–20)
CO2: 24 mmol/L (ref 22–32)
Calcium: 9.3 mg/dL (ref 8.9–10.3)
Chloride: 105 mmol/L (ref 98–111)
Creatinine, Ser: 1.04 mg/dL (ref 0.61–1.24)
GFR calc Af Amer: >60 mL/min (ref >60)
GFR calc non Af Amer: >60 mL/min (ref >60)
Glucose, Bld: 131 mg/dL — ABNORMAL HIGH (ref 70–99)
Potassium: 3.7 mmol/L (ref 3.5–5.1)
Sodium: 142 mmol/L (ref 135–145)
Total Bilirubin: 0.6 mg/dL (ref 0.3–1.2)
Total Protein: 6.5 g/dL (ref 6.5–8.1)

## 2019-08-22 LAB — SARS CORONAVIRUS 2 BY RT PCR (HOSPITAL ORDER, PERFORMED IN ~~LOC~~ HOSPITAL LAB): SARS Coronavirus 2: NEGATIVE

## 2019-08-22 SURGERY — LAPAROTOMY, EXPLORATORY
Anesthesia: General

## 2019-08-22 MED ORDER — SODIUM CHLORIDE 0.9 % IV BOLUS: 1000.0000 mL | Freq: Once | INTRAVENOUS | Status: DC

## 2019-08-22 MED ORDER — TETANUS-DIPHTH-ACELL PERTUSSIS 5-2.5-18.5 LF-MCG/0.5 IM SUSP
0.5000 mL | Freq: Once | INTRAMUSCULAR | Status: DC
  Filled 2019-08-22: qty 0.5

## 2019-08-22 MED ORDER — FENTANYL CITRATE (PF) 250 MCG/5ML IJ SOLN
INTRAMUSCULAR | Status: AC
  Filled 2019-08-22: qty 5

## 2019-08-22 MED ORDER — IOHEXOL 300 MG/ML  SOLN
100.0000 mL | Freq: Once | INTRAMUSCULAR | Status: AC | PRN
  Administered 2019-08-22: 100 mL via INTRAVENOUS

## 2019-08-22 MED ORDER — CEFAZOLIN SODIUM-DEXTROSE 2-4 GM/100ML-% IV SOLN
2.0000 g | Freq: Once | INTRAVENOUS | Status: DC
  Filled 2019-08-22: qty 100

## 2019-08-22 MED ORDER — MIDAZOLAM HCL 2 MG/2ML IJ SOLN
INTRAMUSCULAR | Status: AC
  Filled 2019-08-22: qty 2
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
## 2019-08-22 NOTE — ED Notes (Signed)
EDP notified or pt's refusal. [employer] and security at bedside
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
## 2019-08-22 NOTE — ED Notes (Signed)
Pt refuses surgery- spoke with Dr. Bedelia Person and told her that he was ready to go- will have pt sign AMA form. Explained what against medical advice entails and pt still insists on leaving.

## 2019-08-22 NOTE — ED Notes (Signed)
TTS in progress 

## 2019-08-22 NOTE — Progress Notes (Signed)
Sister currently at the bedside asking about the procedure.  I discussed the procedure along with risks and complications of not proceeding that have already been discussed with the patient himself.  She is pleading with him to get the operation that we have told him we suggest, but the patient still insists that he does not want the surgery and wants to leave.  The patient is apparently not taking his psych meds.  I discussed with the sister that she may want to discuss with his psych doctor about his ability to make decisions and whether at some point he may need a legal guardian given his history of psychiatric disorders and unexplained poor decision making today.  The patient is still telling us he wants to go home.  AMA papers will be given to the patient by nursing staff.  He is encouraged to return if he changes his mind or begins to start feeling worse.  Letha Cape 4:06 PM 08/22/2019
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
## 2019-08-22 NOTE — ED Notes (Signed)
Pt refusing to  change into purple scrubs °
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
## 2019-08-22 NOTE — Anesthesia Preprocedure Evaluation (Deleted)
Anesthesia Evaluation    Reviewed: Allergy & Precautions, Patient's Chart, lab work & pertinent test results  History of Anesthesia Complications Negative for: history of anesthetic complications  Airway        Dental   Pulmonary neg pulmonary ROS,           Cardiovascular negative cardio ROS       Neuro/Psych  Headaches, PSYCHIATRIC DISORDERS Depression Schizophrenia    GI/Hepatic negative GI ROS, (+)     substance abuse  marijuana use,   Endo/Other  negative endocrine ROS  Renal/GU negative Renal ROS     Musculoskeletal negative musculoskeletal ROS (+)   Abdominal   Peds  Hematology  (+) Blood dyscrasia, Sickle cell trait and anemia ,   Anesthesia Other Findings   Reproductive/Obstetrics                             Anesthesia Physical Anesthesia Plan  ASA: II and emergent  Anesthesia Plan: General   Post-op Pain Management:    Induction: Intravenous and Rapid sequence  PONV Risk Score and Plan: 4 or greater and Treatment may vary due to age or medical condition, Ondansetron, Midazolam and Diphenhydramine  Airway Management Planned: Oral ETT  Additional Equipment:   Intra-op Plan:   Post-operative Plan: Extubation in OR  Informed Consent:   Plan Discussed with: CRNA and Anesthesiologist  Anesthesia Plan Comments:         Anesthesia Quick Evaluation
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
## 2019-08-22 NOTE — ED Notes (Signed)
Tresa Endo, PA at bedside- talking with ptand sister. Pt still has stated he wants to leave.
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
## 2019-08-22 NOTE — ED Notes (Signed)
Alinda Dooms, sister, [phone] would like an update when available

## 2019-08-22 NOTE — ED Notes (Signed)
Sister came into room, dr tegeler discussed AMA with pt's permission with sister--

## 2019-08-23 ENCOUNTER — Encounter (HOSPITAL_COMMUNITY): Admission: EM | Disposition: A | Discharge status: Home / Self Care | Payer: Self-pay | Source: Home / Self Care

## 2019-08-23 ENCOUNTER — Encounter (HOSPITAL_COMMUNITY): Payer: Self-pay | Admitting: Certified Registered"

## 2019-08-23 ENCOUNTER — Emergency Department (HOSPITAL_COMMUNITY): Payer: Federal, State, Local not specified - PPO | Admitting: Certified Registered"

## 2019-08-23 DIAGNOSIS — R45851 Suicidal ideations: Secondary | ICD-10-CM | POA: Diagnosis present

## 2019-08-23 DIAGNOSIS — F329 Major depressive disorder, single episode, unspecified: Secondary | ICD-10-CM | POA: Diagnosis present

## 2019-08-23 DIAGNOSIS — S31041A Puncture wound with foreign body of lower back and pelvis with penetration into retroperitoneum, initial encounter: Secondary | ICD-10-CM | POA: Diagnosis present

## 2019-08-23 DIAGNOSIS — Z79899 Other long term (current) drug therapy: Secondary | ICD-10-CM | POA: Diagnosis not present

## 2019-08-23 DIAGNOSIS — F1290 Cannabis use, unspecified, uncomplicated: Secondary | ICD-10-CM | POA: Diagnosis present

## 2019-08-23 DIAGNOSIS — Z9889 Other specified postprocedural states: Secondary | ICD-10-CM

## 2019-08-23 DIAGNOSIS — Z9114 Patient's other noncompliance with medication regimen: Secondary | ICD-10-CM | POA: Diagnosis not present

## 2019-08-23 DIAGNOSIS — W3400XA Accidental discharge from unspecified firearms or gun, initial encounter: Secondary | ICD-10-CM

## 2019-08-23 DIAGNOSIS — D573 Sickle-cell trait: Secondary | ICD-10-CM | POA: Diagnosis present

## 2019-08-23 DIAGNOSIS — R110 Nausea: Secondary | ICD-10-CM | POA: Diagnosis not present

## 2019-08-23 DIAGNOSIS — F209 Schizophrenia, unspecified: Secondary | ICD-10-CM | POA: Diagnosis present

## 2019-08-23 DIAGNOSIS — F4325 Adjustment disorder with mixed disturbance of emotions and conduct: Secondary | ICD-10-CM | POA: Diagnosis present

## 2019-08-23 DIAGNOSIS — W34010A Accidental discharge of airgun, initial encounter: Secondary | ICD-10-CM | POA: Diagnosis not present

## 2019-08-23 DIAGNOSIS — Z20822 Contact with and (suspected) exposure to covid-19: Secondary | ICD-10-CM | POA: Diagnosis present

## 2019-08-23 DIAGNOSIS — R140 Abdominal distension (gaseous): Secondary | ICD-10-CM | POA: Diagnosis not present

## 2019-08-23 HISTORY — PX: LAPAROTOMY: SHX154

## 2019-08-23 LAB — COMPREHENSIVE METABOLIC PANEL
ALT: 22 U/L (ref 0–44)
AST: 32 U/L (ref 15–41)
Albumin: 4.4 g/dL (ref 3.5–5.0)
Alkaline Phosphatase: 58 U/L (ref 38–126)
Anion gap: 11 (ref 5–15)
BUN: 6 mg/dL (ref 6–20)
CO2: 27 mmol/L (ref 22–32)
Calcium: 9.7 mg/dL (ref 8.9–10.3)
Chloride: 104 mmol/L (ref 98–111)
Creatinine, Ser: 1.04 mg/dL (ref 0.61–1.24)
GFR calc Af Amer: >60 mL/min (ref >60)
GFR calc non Af Amer: >60 mL/min (ref >60)
Glucose, Bld: 109 mg/dL — ABNORMAL HIGH (ref 70–99)
Potassium: 3.4 mmol/L — ABNORMAL LOW (ref 3.5–5.1)
Sodium: 142 mmol/L (ref 135–145)
Total Bilirubin: 0.4 mg/dL (ref 0.3–1.2)
Total Protein: 7 g/dL (ref 6.5–8.1)

## 2019-08-23 LAB — CBC
HCT: 40.2 % (ref 39.0–52.0)
HCT: 40.4 % (ref 39.0–52.0)
Hemoglobin: 12.3 g/dL — ABNORMAL LOW (ref 13.0–17.0)
Hemoglobin: 12.3 g/dL — ABNORMAL LOW (ref 13.0–17.0)
MCH: 23.6 pg — ABNORMAL LOW (ref 26.0–34.0)
MCH: 23.3 pg — ABNORMAL LOW (ref 26.0–34.0)
MCHC: 30.6 g/dL (ref 30.0–36.0)
MCHC: 30.4 g/dL (ref 30.0–36.0)
MCV: 77.2 fL — ABNORMAL LOW (ref 80.0–100.0)
MCV: 76.5 fL — ABNORMAL LOW (ref 80.0–100.0)
Platelets: 297 10*3/uL (ref 150–400)
Platelets: 271 10*3/uL (ref 150–400)
RBC: 5.21 MIL/uL (ref 4.22–5.81)
RBC: 5.28 MIL/uL (ref 4.22–5.81)
RDW: 13.3 % (ref 11.5–15.5)
RDW: 13.2 % (ref 11.5–15.5)
WBC: 12.6 10*3/uL — ABNORMAL HIGH (ref 4.0–10.5)
WBC: 18.9 10*3/uL — ABNORMAL HIGH (ref 4.0–10.5)
nRBC: 0 % (ref 0.0–0.2)
nRBC: 0 % (ref 0.0–0.2)

## 2019-08-23 LAB — ACETAMINOPHEN LEVEL: Acetaminophen (Tylenol), Serum: <10 ug/mL — ABNORMAL LOW (ref 10–30)

## 2019-08-23 LAB — BASIC METABOLIC PANEL
Anion gap: 13 (ref 5–15)
BUN: 7 mg/dL (ref 6–20)
CO2: 25 mmol/L (ref 22–32)
Calcium: 9.3 mg/dL (ref 8.9–10.3)
Chloride: 102 mmol/L (ref 98–111)
Creatinine, Ser: 0.95 mg/dL (ref 0.61–1.24)
GFR calc Af Amer: >60 mL/min (ref >60)
GFR calc non Af Amer: >60 mL/min (ref >60)
Glucose, Bld: 152 mg/dL — ABNORMAL HIGH (ref 70–99)
Potassium: 3.5 mmol/L (ref 3.5–5.1)
Sodium: 140 mmol/L (ref 135–145)

## 2019-08-23 LAB — LACTIC ACID, PLASMA
Lactic Acid, Venous: 1.1 mmol/L (ref 0.5–1.9)
Lactic Acid, Venous: 0.9 mmol/L (ref 0.5–1.9)

## 2019-08-23 LAB — RAPID URINE DRUG SCREEN, HOSP PERFORMED
Amphetamines: NOT DETECTED
Barbiturates: NOT DETECTED
Benzodiazepines: POSITIVE — AB
Cocaine: NOT DETECTED
Opiates: POSITIVE — AB
Tetrahydrocannabinol: POSITIVE — AB

## 2019-08-23 LAB — HIV ANTIBODY (ROUTINE TESTING W REFLEX): HIV Screen 4th Generation wRfx: NONREACTIVE

## 2019-08-23 LAB — ETHANOL: Alcohol, Ethyl (B): <10 mg/dL (ref <10)

## 2019-08-23 LAB — SALICYLATE LEVEL: Salicylate Lvl: <7 mg/dL — ABNORMAL LOW (ref 7.0–30.0)

## 2019-08-23 SURGERY — LAPAROTOMY, EXPLORATORY
Anesthesia: General | Site: Abdomen

## 2019-08-23 MED ORDER — MIDAZOLAM HCL 2 MG/2ML IJ SOLN
INTRAMUSCULAR | Status: AC
  Filled 2019-08-23: qty 2

## 2019-08-23 MED ORDER — SUCCINYLCHOLINE CHLORIDE 20 MG/ML IJ SOLN
INTRAMUSCULAR | Status: DC | PRN
  Administered 2019-08-23: 140 mg via INTRAVENOUS

## 2019-08-23 MED ORDER — HYDROMORPHONE HCL 1 MG/ML IJ SOLN
0.2500 mg | INTRAMUSCULAR | Status: DC | PRN
  Administered 2019-08-23 (×4): 0.5 mg via INTRAVENOUS

## 2019-08-23 MED ORDER — CEFAZOLIN SODIUM-DEXTROSE 2-4 GM/100ML-% IV SOLN
2.0000 g | Freq: Once | INTRAVENOUS | Status: AC
  Administered 2019-08-23: 2 g via INTRAVENOUS
  Filled 2019-08-23: qty 100

## 2019-08-23 MED ORDER — SUCCINYLCHOLINE CHLORIDE 200 MG/10ML IV SOSY
PREFILLED_SYRINGE | INTRAVENOUS | Status: AC
  Filled 2019-08-23: qty 10

## 2019-08-23 MED ORDER — MIDAZOLAM HCL 5 MG/5ML IJ SOLN
INTRAMUSCULAR | Status: DC | PRN
  Administered 2019-08-23: 2 mg via INTRAVENOUS

## 2019-08-23 MED ORDER — SUFENTANIL CITRATE 50 MCG/ML IV SOLN
INTRAVENOUS | Status: DC | PRN
  Administered 2019-08-23 (×2): 10 ug via INTRAVENOUS

## 2019-08-23 MED ORDER — SUGAMMADEX SODIUM 200 MG/2ML IV SOLN
INTRAVENOUS | Status: DC | PRN
  Administered 2019-08-23: 200 mg via INTRAVENOUS

## 2019-08-23 MED ORDER — MEPERIDINE HCL 25 MG/ML IJ SOLN: 6.2500 mg | INTRAMUSCULAR | Status: DC | PRN

## 2019-08-23 MED ORDER — 0.9 % SODIUM CHLORIDE (POUR BTL) OPTIME
TOPICAL | Status: DC | PRN
  Administered 2019-08-23 (×2): 1000 mL

## 2019-08-23 MED ORDER — HYDROMORPHONE HCL 1 MG/ML IJ SOLN
INTRAMUSCULAR | Status: AC
  Filled 2019-08-23: qty 1

## 2019-08-23 MED ORDER — HYDROMORPHONE HCL 1 MG/ML IJ SOLN
1.0000 mg | INTRAMUSCULAR | Status: DC | PRN
  Administered 2019-08-23 – 2019-08-24 (×4): 1 mg via INTRAVENOUS
  Filled 2019-08-23 (×4): qty 1

## 2019-08-23 MED ORDER — SUFENTANIL CITRATE 50 MCG/ML IV SOLN
INTRAVENOUS | Status: AC
  Filled 2019-08-23: qty 1

## 2019-08-23 MED ORDER — ACETAMINOPHEN 325 MG PO TABS
650.0000 mg | ORAL_TABLET | Freq: Four times a day (QID) | ORAL | Status: DC
  Administered 2019-08-23 – 2019-08-25 (×6): 650 mg via ORAL
  Filled 2019-08-23 (×6): qty 2

## 2019-08-23 MED ORDER — METHOCARBAMOL 1000 MG/10ML IJ SOLN
1000.0000 mg | Freq: Three times a day (TID) | INTRAVENOUS | Status: DC | PRN
  Administered 2019-08-23: 1000 mg via INTRAVENOUS
  Filled 2019-08-23 (×2): qty 10

## 2019-08-23 MED ORDER — ENOXAPARIN SODIUM 30 MG/0.3ML ~~LOC~~ SOLN
30.0000 mg | Freq: Two times a day (BID) | SUBCUTANEOUS | Status: DC
  Administered 2019-08-23: 30 mg via SUBCUTANEOUS
  Filled 2019-08-23 (×4): qty 0.3

## 2019-08-23 MED ORDER — ONDANSETRON 4 MG PO TBDP: 4.0000 mg | ORAL_TABLET | Freq: Four times a day (QID) | ORAL | Status: DC | PRN

## 2019-08-23 MED ORDER — ONDANSETRON HCL 4 MG/2ML IJ SOLN
INTRAMUSCULAR | Status: DC | PRN
  Administered 2019-08-23: 4 mg via INTRAVENOUS

## 2019-08-23 MED ORDER — CEFAZOLIN SODIUM-DEXTROSE 2-3 GM-%(50ML) IV SOLR
INTRAVENOUS | Status: DC | PRN
Start: 2019-08-23 — End: 2019-08-23
  Administered 2019-08-23: 2 g via INTRAVENOUS

## 2019-08-23 MED ORDER — PROMETHAZINE HCL 25 MG/ML IJ SOLN: 6.2500 mg | INTRAMUSCULAR | Status: DC | PRN

## 2019-08-23 MED ORDER — DEXMEDETOMIDINE HCL IN NACL 200 MCG/50ML IV SOLN
INTRAVENOUS | Status: AC
  Filled 2019-08-23: qty 50

## 2019-08-23 MED ORDER — DEXAMETHASONE SODIUM PHOSPHATE 10 MG/ML IJ SOLN
INTRAMUSCULAR | Status: AC
  Filled 2019-08-23: qty 1

## 2019-08-23 MED ORDER — PROPOFOL 10 MG/ML IV BOLUS
INTRAVENOUS | Status: DC | PRN
  Administered 2019-08-23: 200 mg via INTRAVENOUS

## 2019-08-23 MED ORDER — OXYCODONE HCL 5 MG PO TABS
5.0000 mg | ORAL_TABLET | ORAL | Status: DC | PRN
  Administered 2019-08-25: 5 mg via ORAL
  Filled 2019-08-23: qty 1

## 2019-08-23 MED ORDER — PROPOFOL 10 MG/ML IV BOLUS
INTRAVENOUS | Status: AC
  Filled 2019-08-23: qty 20

## 2019-08-23 MED ORDER — LIDOCAINE 2% (20 MG/ML) 5 ML SYRINGE
INTRAMUSCULAR | Status: AC
  Filled 2019-08-23: qty 5

## 2019-08-23 MED ORDER — ONDANSETRON HCL 4 MG/2ML IJ SOLN
INTRAMUSCULAR | Status: AC
  Filled 2019-08-23: qty 2

## 2019-08-23 MED ORDER — OXYCODONE HCL 5 MG PO TABS
10.0000 mg | ORAL_TABLET | ORAL | Status: DC | PRN
  Administered 2019-08-23 (×4): 10 mg via ORAL
  Filled 2019-08-23 (×4): qty 2

## 2019-08-23 MED ORDER — DEXMEDETOMIDINE HCL IN NACL 200 MCG/50ML IV SOLN
INTRAVENOUS | Status: DC | PRN
Start: 2019-08-23 — End: 2019-08-23
  Administered 2019-08-23: 4 ug via INTRAVENOUS

## 2019-08-23 MED ORDER — ROCURONIUM BROMIDE 100 MG/10ML IV SOLN
INTRAVENOUS | Status: DC | PRN
  Administered 2019-08-23: 50 mg via INTRAVENOUS

## 2019-08-23 MED ORDER — DEXAMETHASONE SODIUM PHOSPHATE 10 MG/ML IJ SOLN
INTRAMUSCULAR | Status: DC | PRN
  Administered 2019-08-23: 10 mg via INTRAVENOUS

## 2019-08-23 MED ORDER — METOPROLOL TARTRATE 5 MG/5ML IV SOLN: 5.0000 mg | Freq: Four times a day (QID) | INTRAVENOUS | Status: DC | PRN

## 2019-08-23 MED ORDER — SODIUM CHLORIDE (PF) 0.9 % IJ SOLN
INTRAMUSCULAR | Status: AC
  Filled 2019-08-23: qty 10

## 2019-08-23 MED ORDER — LACTATED RINGERS IV SOLN
INTRAVENOUS | Status: DC | PRN
Start: 2019-08-23 — End: 2019-08-23

## 2019-08-23 MED ORDER — ONDANSETRON HCL 4 MG/2ML IJ SOLN
4.0000 mg | Freq: Four times a day (QID) | INTRAMUSCULAR | Status: DC | PRN
  Administered 2019-08-24: 4 mg via INTRAVENOUS
  Filled 2019-08-23: qty 2

## 2019-08-23 MED ORDER — LIDOCAINE HCL (CARDIAC) PF 100 MG/5ML IV SOSY
PREFILLED_SYRINGE | INTRAVENOUS | Status: DC | PRN
  Administered 2019-08-23: 100 mg via INTRATRACHEAL

## 2019-08-23 MED ORDER — POTASSIUM CHLORIDE IN NACL 20-0.9 MEQ/L-% IV SOLN
INTRAVENOUS | Status: DC
  Filled 2019-08-23 (×5): qty 1000

## 2019-08-23 MED ORDER — STERILE WATER FOR IRRIGATION IR SOLN
Status: DC | PRN
  Administered 2019-08-23: 1000 mL

## 2019-08-23 SURGICAL SUPPLY — 43 items
BLADE CLIPPER SURG (BLADE) IMPLANT
CANISTER SUCT 3000ML PPV (MISCELLANEOUS) ×2 IMPLANT
CHLORAPREP W/TINT 26 (MISCELLANEOUS) ×2 IMPLANT
COVER SURGICAL LIGHT HANDLE (MISCELLANEOUS) ×2 IMPLANT
COVER WAND RF STERILE (DRAPES) IMPLANT
DRAPE LAPAROSCOPIC ABDOMINAL (DRAPES) ×2 IMPLANT
DRAPE WARM FLUID 44X44 (DRAPES) ×2 IMPLANT
DRSG OPSITE POSTOP 4X10 (GAUZE/BANDAGES/DRESSINGS) IMPLANT
DRSG OPSITE POSTOP 4X8 (GAUZE/BANDAGES/DRESSINGS) ×2 IMPLANT
ELECT BLADE 6.5 EXT (BLADE) ×2 IMPLANT
ELECT CAUTERY BLADE 6.4 (BLADE) ×2 IMPLANT
ELECT REM PT RETURN 9FT ADLT (ELECTROSURGICAL) ×2
ELECTRODE REM PT RTRN 9FT ADLT (ELECTROSURGICAL) ×1 IMPLANT
GLOVE BIO SURGEON STRL SZ8 (GLOVE) ×2 IMPLANT
GLOVE BIOGEL PI IND STRL 8 (GLOVE) ×3 IMPLANT
GLOVE BIOGEL PI INDICATOR 8 (GLOVE) ×3
GLOVE SURG SS PI 6.5 STRL IVOR (GLOVE) ×4 IMPLANT
GOWN STRL REIN XL XLG (GOWN DISPOSABLE) ×2 IMPLANT
GOWN STRL REUS W/ TWL LRG LVL3 (GOWN DISPOSABLE) ×1 IMPLANT
GOWN STRL REUS W/ TWL XL LVL3 (GOWN DISPOSABLE) ×1 IMPLANT
GOWN STRL REUS W/TWL LRG LVL3 (GOWN DISPOSABLE) ×1
GOWN STRL REUS W/TWL XL LVL3 (GOWN DISPOSABLE) ×1
HANDLE SUCTION POOLE (INSTRUMENTS) ×1 IMPLANT
KIT BASIN OR (CUSTOM PROCEDURE TRAY) ×2 IMPLANT
KIT TURNOVER KIT B (KITS) ×2 IMPLANT
LIGASURE IMPACT 36 18CM CVD LR (INSTRUMENTS) IMPLANT
MANIFOLD NEPTUNE II (INSTRUMENTS) ×2 IMPLANT
NS IRRIG 1000ML POUR BTL (IV SOLUTION) ×4 IMPLANT
PACK GENERAL/GYN (CUSTOM PROCEDURE TRAY) ×2 IMPLANT
PAD ARMBOARD 7.5X6 YLW CONV (MISCELLANEOUS) ×2 IMPLANT
PENCIL SMOKE EVACUATOR (MISCELLANEOUS) ×4 IMPLANT
SPECIMEN JAR LARGE (MISCELLANEOUS) IMPLANT
SPONGE LAP 18X18 RF (DISPOSABLE) ×2 IMPLANT
STAPLER VISISTAT 35W (STAPLE) ×2 IMPLANT
SUCTION POOLE HANDLE (INSTRUMENTS) ×2
SUT PDS AB 1 TP1 96 (SUTURE) ×4 IMPLANT
SUT SILK 2 0 SH CR/8 (SUTURE) ×2 IMPLANT
SUT SILK 2 0 TIES 10X30 (SUTURE) ×2 IMPLANT
SUT SILK 3 0 SH CR/8 (SUTURE) ×2 IMPLANT
SUT SILK 3 0 TIES 10X30 (SUTURE) ×2 IMPLANT
TOWEL GREEN STERILE (TOWEL DISPOSABLE) ×2 IMPLANT
TRAY FOLEY MTR SLVR 16FR STAT (SET/KITS/TRAYS/PACK) ×2 IMPLANT
YANKAUER SUCT BULB TIP NO VENT (SUCTIONS) IMPLANT
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
## 2019-08-23 NOTE — Anesthesia Procedure Notes (Signed)
Procedure Name: Intubation Date/Time: 08/23/2019 1:50 AM Performed by: Claris Che, CRNA Pre-anesthesia Checklist: Patient identified, Emergency Drugs available, Suction available, Patient being monitored and Timeout performed Patient Re-evaluated:Patient Re-evaluated prior to induction Oxygen Delivery Method: Circle system utilized Preoxygenation: Pre-oxygenation with 100% oxygen Induction Type: IV induction, Rapid sequence and Cricoid Pressure applied Laryngoscope Size: Mac and 4 Grade View: Grade II Tube type: Oral Tube size: 7.5 mm Number of attempts: 1 Airway Equipment and Method: Stylet Placement Confirmation: ETT inserted through vocal cords under direct vision,  positive ETCO2 and breath sounds checked- equal and bilateral Secured at: 23 cm Tube secured with: Tape Dental Injury: Teeth and Oropharynx as per pre-operative assessment

## 2019-08-23 NOTE — Progress Notes (Signed)
Received pt from PACU, pt alert and oriented x4. Pt with midline incision with honeycomb dressing, minimal bleeding noted. Pt complains of 10/10 incisional pain. Pain med given. Oriented pt to plan of care and use of call light. Will monitor pt.

## 2019-08-23 NOTE — Anesthesia Preprocedure Evaluation (Addendum)
Anesthesia Evaluation   Patient awake    Reviewed: Allergy & Precautions, NPO status , Patient's Chart, lab work & pertinent test results  History of Anesthesia Complications Negative for: history of anesthetic complications  Airway Mallampati: I  TM Distance: >3 FB Neck ROM: Full    Dental no notable dental hx. (+) Dental Advisory Given   Pulmonary neg pulmonary ROS,    Pulmonary exam normal breath sounds clear to auscultation       Cardiovascular negative cardio ROS Normal cardiovascular exam Rhythm:Regular Rate:Normal     Neuro/Psych  Headaches, PSYCHIATRIC DISORDERS Depression Schizophrenia    GI/Hepatic negative GI ROS, (+)     substance abuse  marijuana use,   Endo/Other  negative endocrine ROS  Renal/GU negative Renal ROS     Musculoskeletal negative musculoskeletal ROS (+)   Abdominal   Peds  Hematology  (+) Blood dyscrasia, Sickle cell trait and anemia ,   Anesthesia Other Findings   Reproductive/Obstetrics                            Anesthesia Physical  Anesthesia Plan  ASA: II and emergent  Anesthesia Plan: General   Post-op Pain Management:    Induction: Intravenous, Rapid sequence and Cricoid pressure planned  PONV Risk Score and Plan: 4 or greater and Treatment may vary due to age or medical condition, Ondansetron, Midazolam and Diphenhydramine  Airway Management Planned: Oral ETT  Additional Equipment:   Intra-op Plan:   Post-operative Plan: Extubation in OR  Informed Consent: I have reviewed the patients History and Physical, chart, labs and discussed the procedure including the risks, benefits and alternatives for the proposed anesthesia with the patient or authorized representative who has indicated his/her understanding and acceptance.     Dental advisory given  Plan Discussed with: CRNA  Anesthesia Plan Comments:        Anesthesia Quick  Evaluation

## 2019-08-23 NOTE — Transfer of Care (Signed)
Immediate Anesthesia Transfer of Care Note  Patient: Garrett Bolton  Procedure(s) Performed: EXPLORATORY LAPAROTOMY (N/A )  Patient Location: PACU  Anesthesia Type:General  Level of Consciousness: oriented, sedated, drowsy, patient cooperative and responds to stimulation  Airway & Oxygen Therapy: Patient Spontanous Breathing and Patient connected to nasal cannula oxygen  Post-op Assessment: Report given to RN, Post -op Vital signs reviewed and stable and Patient moving all extremities X 4  Post vital signs: Reviewed and stable  Last Vitals:  Vitals Value Taken Time  BP 165/99 08/23/19 0257  Temp    Pulse 91 08/23/19 0258  Resp 14 08/23/19 0258  SpO2 100 % 08/23/19 0258  Vitals shown include unvalidated device data.  Last Pain:  Vitals:   08/23/19 0059  TempSrc: Oral         Complications: No apparent anesthesia complications
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
## 2019-08-24 MED ORDER — POLYETHYLENE GLYCOL 3350 17 G PO PACK
17.0000 g | PACK | Freq: Every day | ORAL | Status: DC | PRN
  Administered 2019-08-24: 17 g via ORAL
  Filled 2019-08-24: qty 1

## 2019-08-24 MED ORDER — DOCUSATE SODIUM 100 MG PO CAPS
100.0000 mg | ORAL_CAPSULE | Freq: Two times a day (BID) | ORAL | Status: DC
  Administered 2019-08-24 – 2019-08-25 (×2): 100 mg via ORAL
  Filled 2019-08-24 (×3): qty 1

## 2019-08-24 NOTE — Consult Note (Signed)
Client in PACU after surgery, will evaluate tomorrow.  Nanine Means, PMHNP

## 2019-08-24 NOTE — Anesthesia Postprocedure Evaluation (Signed)
Anesthesia Post Note  Patient: Garrett Bolton  Procedure(s) Performed: EXPLORATORY LAPAROTOMY (N/A Abdomen)     Patient location during evaluation: PACU Anesthesia Type: General Level of consciousness: sedated and patient cooperative Pain management: pain level controlled Vital Signs Assessment: post-procedure vital signs reviewed and stable Respiratory status: spontaneous breathing Cardiovascular status: stable Anesthetic complications: no    Last Vitals:  Vitals:   08/24/19 0554 08/24/19 0940  BP: (!) 139/97 129/87  Pulse: 68 64  Resp: 18 16  Temp: 37.1 C 37 C  SpO2: 100% 100%    Last Pain:  Vitals:   08/24/19 0940  TempSrc: Oral  PainSc:                  Lewie Loron
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
## 2019-08-25 DIAGNOSIS — F4325 Adjustment disorder with mixed disturbance of emotions and conduct: Secondary | ICD-10-CM | POA: Diagnosis not present

## 2019-08-25 MED ORDER — POLYETHYLENE GLYCOL 3350 17 G PO PACK
17.0000 g | PACK | Freq: Every day | ORAL | Status: DC
  Administered 2019-08-25: 17 g via ORAL
  Filled 2019-08-25: qty 1

## 2019-08-25 MED ORDER — ACETAMINOPHEN 500 MG PO TABS
1000.0000 mg | ORAL_TABLET | Freq: Four times a day (QID) | ORAL | Status: DC
  Administered 2019-08-25: 1000 mg via ORAL
  Filled 2019-08-25: qty 2

## 2019-08-25 MED ORDER — METHOCARBAMOL 500 MG PO TABS: 1000.0000 mg | ORAL_TABLET | Freq: Three times a day (TID) | ORAL | Status: DC | PRN

## 2019-08-25 NOTE — Plan of Care (Signed)
  Problem: Education: Goal: Knowledge of General Education information will improve Description: Including pain rating scale, medication(s)/side effects and non-pharmacologic comfort measures Outcome: Progressing   Problem: Coping: Goal: Level of anxiety will decrease Outcome: Progressing  Plan of care and rules for IVC explained at length to patient and family. Explained at length that security would be called if patient attempted to disrupt incision site or if he threatened staff.  Problem: Nutrition: Goal: Adequate nutrition will be maintained Outcome: Progressing  Patient upgraded to regular diet at his request.

## 2019-08-25 NOTE — TOC CAGE-AID Note (Signed)
Transition of Care [facility]) - CAGE-AID Screening   Patient Details  Name: Garrett Bolton MRN: [id number] Date of Birth: [birth year]  Transition of [facility]) CM/SW Contact:    Jimmy Picket, LCSWA Phone Number: 08/25/2019, 1:32 PM   Clinical Narrative:  Pt denies alcohol use. Pt reports occasional marijuana use. Pt denied resources and education. No resources given.  CAGE-AID Screening:    Have You Ever Felt You Ought to Cut Down on Your Drinking or Drug Use?: No Have People Annoyed You By Critizing Your Drinking Or Drug Use?: No Have You Felt Bad Or Guilty About Your Drinking Or Drug Use?: No Have You Ever Had a Drink or Used Drugs First Thing In The Morning to STeady Your Nerves or to Get Rid of a Hangover?: No CAGE-AID Score: 0  Substance Abuse Education Offered: No    Denita Lung, Bridget Hartshorn Clinical Social Worker [phone]

## 2019-08-25 NOTE — Progress Notes (Signed)
Patient called RN into room, asked RN to remove IV and staples at surgical site. RN asked why, patient stated he wanted to leave AMA. Explained to patient that was not possible under IVC order. Provided rationale for IV fluids and leaving staples in surgical site. Patient stating, "What happens if I try to leave anyway?" RN advised patient that security would have to be called if IVC order was still in place. Patient stating, "Well I guess I would have to fight them." RN read through chart, noting psychiatry note advising lifting IVC order, patient continuing to state "I want to leave".
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
## 2019-08-25 NOTE — Progress Notes (Signed)
Orders changed to gain patient compliance, IV removed and IVF discontinued. Diet changed. Family visiting with patient. Patient requests shower. Patient reassured that staff would assist him when time allows.

## 2019-08-25 NOTE — Progress Notes (Signed)
Patient refuses lovenox. Education provided on preventing blood clots, strokes and heart attacks after surgery. Patient continues to refuse.
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
## 2019-08-26 MED ORDER — POLYETHYLENE GLYCOL 3350 17 G PO PACK: 17.0000 g | PACK | Freq: Every day | ORAL | 0 refills | Status: DC

## 2019-08-26 MED ORDER — ACETAMINOPHEN 500 MG PO TABS: 1000.0000 mg | ORAL_TABLET | Freq: Four times a day (QID) | ORAL | 0 refills | Status: DC | PRN

## 2019-08-26 MED ORDER — OXYCODONE HCL 5 MG PO TABS: 5.0000 mg | ORAL_TABLET | ORAL | 0 refills | Status: DC | PRN

## 2019-08-26 MED ORDER — METHOCARBAMOL 500 MG PO TABS: 1000.0000 mg | ORAL_TABLET | Freq: Three times a day (TID) | ORAL | 0 refills | Status: DC | PRN

## 2019-08-26 NOTE — Plan of Care (Signed)
  Problem: Education: Goal: Knowledge of General Education information will improve Description: Including pain rating scale, medication(s)/side effects and non-pharmacologic comfort measures Outcome: Progressing   Problem: Health Behavior/Discharge Planning: Goal: Ability to manage health-related needs will improve Outcome: Progressing   Problem: Clinical Measurements: Goal: Ability to maintain clinical measurements within normal limits will improve Outcome: Progressing Goal: Will remain free from infection Outcome: Progressing Goal: Diagnostic test results will improve Outcome: Progressing Goal: Respiratory complications will improve Outcome: Progressing Goal: Cardiovascular complication will be avoided Outcome: Progressing   Problem: Activity: Goal: Risk for activity intolerance will decrease Outcome: Progressing   Problem: Nutrition: Goal: Adequate nutrition will be maintained Outcome: Progressing   Problem: Coping: Goal: Level of anxiety will decrease Outcome: Progressing   Problem: Elimination: Goal: Will not experience complications related to bowel motility Outcome: Progressing Goal: Will not experience complications related to urinary retention Outcome: Progressing   Problem: Pain Managment: Goal: General experience of comfort will improve Outcome: Progressing   Problem: Pain Managment: Goal: General experience of comfort will improve Outcome: Progressing   Problem: Safety: Goal: Ability to remain free from injury will improve Outcome: Progressing   Problem: Skin Integrity: Goal: Risk for impaired skin integrity will decrease Outcome: Progressing   Problem: Education: Goal: Required Educational Video(s) Outcome: Progressing   Problem: Clinical Measurements: Goal: Ability to maintain clinical measurements within normal limits will improve Outcome: Progressing Goal: Postoperative complications will be avoided or minimized Outcome: Progressing    Problem: Skin Integrity: Goal: Demonstration of wound healing without infection will improve Outcome: Progressing

## 2019-08-26 NOTE — Progress Notes (Signed)
Awaiting for IVC to be completed - Psychiatry has spoken to Pt and said they will be lifting the IVC.  Once completed, will notify Barnetta Chapel with General Surgery for completion of Pt's discharge

## 2019-08-26 NOTE — Progress Notes (Signed)
Patient has refused all medications this shift but has remained calm and appropriate with no outbursts or behavioral issues. 08/26/2019 @ 0554 Garrett Bolton

## 2019-08-26 NOTE — Discharge Instructions (Signed)
[facility]  [address] [place], [place]  [address] [phone] for an appointment   [facility], [place] [phone]  OPEN ABDOMINAL SURGERY: POST OP INSTRUCTIONS  Always review your discharge instruction sheet given to you by the facility where your surgery was performed.  IF YOU HAVE DISABILITY OR FAMILY LEAVE FORMS, YOU MUST BRING THEM TO THE OFFICE FOR PROCESSING.  PLEASE DO NOT GIVE THEM TO YOUR DOCTOR.  1. A prescription for pain medication may be given to you upon discharge.  Take your pain medication as prescribed, if needed.  If narcotic pain medicine is not needed, then you may take acetaminophen (Tylenol) or ibuprofen (Advil) as needed. 2. Take your usually prescribed medications unless otherwise directed. 3. If you need a refill on your pain medication, please contact your pharmacy. They will contact our office to request authorization.  Prescriptions will not be filled after 5pm or on week-ends. 4. You should follow a light diet the first few days after arrival home, such as soup and crackers, pudding, etc.unless your doctor has advised otherwise. A high-fiber, low fat diet can be resumed as tolerated.   Be sure to include lots of fluids daily. Most patients will experience some swelling and bruising on the chest and neck area.  Ice packs will help.  Swelling and bruising can take several days to resolve 5. Most patients will experience some swelling and bruising in the area of the incision. Ice pack will help. Swelling and bruising can take several days to resolve..  6. It is common to experience some constipation if taking pain medication after surgery.  Increasing fluid intake and taking a stool softener will usually help or prevent this problem from occurring.  A mild laxative (Milk of Magnesia or Miralax) should be taken according to package directions if there are no bowel movements after 48 hours. 7.  You may have steri-strips (small skin  tapes) in place directly over the incision.  These strips should be left on the skin for 7-10 days.  If your surgeon used skin glue on the incision, you may shower in 24 hours.  The glue will flake off over the next 2-3 weeks.  Any sutures or staples will be removed at the office during your follow-up visit. You may find that a light gauze bandage over your incision may keep your staples from being rubbed or pulled. You may shower and replace the bandage daily. 8. ACTIVITIES:  You may resume regular (light) daily activities beginning the next day--such as daily self-care, walking, climbing stairs--gradually increasing activities as tolerated.  You may have sexual intercourse when it is comfortable.  Refrain from any heavy lifting or straining until approved by your doctor. a. You may drive when you no longer are taking prescription pain medication, you can comfortably wear a seatbelt, and you can safely maneuver your car and apply brakes b. Return to Work: ___________________________________ 58. You should see your doctor in the office for a follow-up appointment approximately two weeks after your surgery.  Make sure that you call for this appointment within a day or two after you arrive home to insure a convenient appointment time. OTHER INSTRUCTIONS:  _____________________________________________________________ _____________________________________________________________  WHEN TO CALL YOUR DOCTOR: 1. Fever over 101.0 2. Inability to urinate 3. Nausea and/or vomiting 4. Extreme swelling or bruising 5. Continued bleeding from incision. 6. Increased pain, redness, or drainage from the incision. 7. Difficulty swallowing or breathing 8. Muscle cramping or spasms.  9. Numbness or tingling in hands or feet or around lips.  The clinic staff is available to answer your questions during regular business hours.  Please dont hesitate to call and ask to speak to one of the nurses if you have  concerns.  For further questions, please visit [email or web address]     Managing Your Pain After Surgery Without Opioids    Thank you for participating in our program to help patients manage their pain after surgery without opioids. This is part of our effort to provide you with the best care possible, without exposing you or your family to the risk that opioids pose.  What pain can I expect after surgery? You can expect to have some pain after surgery. This is normal. The pain is typically worse the day after surgery, and quickly begins to get better. Many studies have found that many patients are able to manage their pain after surgery with Over-the-Counter (OTC) medications such as Tylenol and Motrin. If you have a condition that does not allow you to take Tylenol or Motrin, notify your surgical team.  How will I manage my pain? The best strategy for controlling your pain after surgery is around the clock pain control with Tylenol (acetaminophen) and Motrin (ibuprofen or Advil). Alternating these medications with each other allows you to maximize your pain control. In addition to Tylenol and Motrin, you can use heating pads or ice packs on your incisions to help reduce your pain.  How will I alternate your regular strength over-the-counter pain medication? You will take a dose of pain medication every three hours. ; Start by taking 650 mg of Tylenol (2 pills of 325 mg) ; 3 hours later take 600 mg of Motrin (3 pills of 200 mg) ; 3 hours after taking the Motrin take 650 mg of Tylenol ; 3 hours after that take 600 mg of Motrin.   - 1 -  See example - if your first dose of Tylenol is at 12:00 PM   12:00 PM Tylenol 650 mg (2 pills of 325 mg)  3:00 PM Motrin 600 mg (3 pills of 200 mg)  6:00 PM Tylenol 650 mg (2 pills of 325 mg)  9:00 PM Motrin 600 mg (3 pills of 200 mg)  Continue alternating every 3 hours   We recommend that you follow this schedule  around-the-clock for at least 3 days after surgery, or until you feel that it is no longer needed. Use the table on the last page of this handout to keep track of the medications you are taking. Important: Do not take more than 3000mg  of Tylenol or 3200mg  of Motrin in a 24-hour period. Do not take ibuprofen/Motrin if you have a history of bleeding stomach ulcers, severe kidney disease, &/or actively taking a blood thinner  What if I still have pain? If you have pain that is not controlled with the over-the-counter pain medications (Tylenol and Motrin or Advil) you might have what we call breakthrough pain. You will receive a prescription for a small amount of an opioid pain medication such as Oxycodone, Tramadol, or Tylenol with Codeine. Use these opioid pills in the first 24 hours after surgery if you have breakthrough pain. Do not take more than 1 pill every 4-6 hours.  If you still have uncontrolled pain after using all opioid pills, don't hesitate to call our staff using the number provided. We will help make sure you are managing your pain in the best way possible, and if necessary,  we can provide a prescription for additional pain medication.   Day 1    Time  Name of Medication Number of pills taken  Amount of Acetaminophen  Pain Level   Comments  AM PM       AM PM       AM PM       AM PM       AM PM       AM PM       AM PM       AM PM       Total Daily amount of Acetaminophen Do not take more than  3,000 mg per day      Day 2    Time  Name of Medication Number of pills taken  Amount of Acetaminophen  Pain Level   Comments  AM PM       AM PM       AM PM       AM PM       AM PM       AM PM       AM PM       AM PM       Total Daily amount of Acetaminophen Do not take more than  3,000 mg per day      Day 3    Time  Name of Medication Number of pills taken  Amount of Acetaminophen  Pain Level   Comments  AM PM       AM PM       AM PM       AM PM           AM PM       AM PM       AM PM       AM PM       Total Daily amount of Acetaminophen Do not take more than  3,000 mg per day      Day 4    Time  Name of Medication Number of pills taken  Amount of Acetaminophen  Pain Level   Comments  AM PM       AM PM       AM PM       AM PM       AM PM       AM PM       AM PM       AM PM       Total Daily amount of Acetaminophen Do not take more than  3,000 mg per day      Day 5    Time  Name of Medication Number of pills taken  Amount of Acetaminophen  Pain Level   Comments  AM PM       AM PM       AM PM       AM PM       AM PM       AM PM       AM PM       AM PM       Total Daily amount of Acetaminophen Do not take more than  3,000 mg per day       Day 6    Time  Name of Medication Number of pills taken  Amount of Acetaminophen  Pain Level  Comments  AM PM       AM PM  AM PM       AM PM       AM PM       AM PM       AM PM       AM PM       Total Daily amount of Acetaminophen Do not take more than  3,000 mg per day      Day 7    Time  Name of Medication Number of pills taken  Amount of Acetaminophen  Pain Level   Comments  AM PM       AM PM       AM PM       AM PM       AM PM       AM PM       AM PM       AM PM       Total Daily amount of Acetaminophen Do not take more than  3,000 mg per day        For additional information about how and where to safely dispose of unused opioid medications - [email or web address]  Disclaimer: This document contains information and/or instructional materials adapted from [facility] for the typical patient with your condition. It does not replace medical advice from your health care provider because your experience may differ from that of the typical patient. Talk to your health care provider if you have any questions about this document, your condition or your treatment plan. Adapted from [facility]

## 2019-08-26 NOTE — Consult Note (Signed)
  Psych re consulted to rescind patient IVC paperwork. Patient has been seen and reassessed at this time. Patient continues to deny suicidal ideations, homicidal ideations and or hallucinations. He is inquiring about his IVC paperwork, and requesting to speak with the magistrate as he continues to be IVC'd and he disagrees with this. He denies any previous psychiatric history however it is notable for 2 psychiatric admissions this year for schizophrenia. Patient was previously psych cleared. Will rescind IVC at this time. Writer is coordinating this paperwork with Disposition SW. WIll fax papers to the unit upon completion.

## 2019-08-26 NOTE — Plan of Care (Signed)
  Problem: Education: Goal: Knowledge of General Education information will improve Description: Including pain rating scale, medication(s)/side effects and non-pharmacologic comfort measures 08/26/2019 1650 by Luna Kitchens, RN Outcome: Adequate for Discharge 08/26/2019 0742 by Luna Kitchens, RN Outcome: Progressing   Problem: Clinical Measurements: Goal: Ability to maintain clinical measurements within normal limits will improve 08/26/2019 1650 by Luna Kitchens, RN Outcome: Adequate for Discharge 08/26/2019 0742 by Luna Kitchens, RN Outcome: Progressing Goal: Will remain free from infection 08/26/2019 1650 by Luna Kitchens, RN Outcome: Adequate for Discharge 08/26/2019 0742 by Luna Kitchens, RN Outcome: Progressing Goal: Diagnostic test results will improve 08/26/2019 1650 by Luna Kitchens, RN Outcome: Adequate for Discharge 08/26/2019 0742 by Luna Kitchens, RN Outcome: Progressing Goal: Respiratory complications will improve 08/26/2019 1650 by Luna Kitchens, RN Outcome: Adequate for Discharge 08/26/2019 0742 by Luna Kitchens, RN Outcome: Progressing Goal: Cardiovascular complication will be avoided 08/26/2019 1650 by Luna Kitchens, RN Outcome: Adequate for Discharge 08/26/2019 0742 by Luna Kitchens, RN Outcome: Progressing   Problem: Activity: Goal: Risk for activity intolerance will decrease 08/26/2019 1650 by Luna Kitchens, RN Outcome: Adequate for Discharge 08/26/2019 0742 by Luna Kitchens, RN Outcome: Progressing   Problem: Nutrition: Goal: Adequate nutrition will be maintained 08/26/2019 1650 by Luna Kitchens, RN Outcome: Adequate for Discharge 08/26/2019 0742 by Luna Kitchens, RN Outcome: Progressing   Problem: Coping: Goal: Level of anxiety will decrease 08/26/2019 1650 by Luna Kitchens, RN Outcome: Adequate for Discharge 08/26/2019 0742 by Luna Kitchens, RN Outcome: Progressing   Problem: Elimination: Goal: Will not experience complications related to bowel motility 08/26/2019 1650 by  Luna Kitchens, RN Outcome: Adequate for Discharge 08/26/2019 0742 by Luna Kitchens, RN Outcome: Progressing Goal: Will not experience complications related to urinary retention 08/26/2019 1650 by Luna Kitchens, RN Outcome: Adequate for Discharge 08/26/2019 0742 by Luna Kitchens, RN Outcome: Progressing   Problem: Pain Managment: Goal: General experience of comfort will improve 08/26/2019 1650 by Luna Kitchens, RN Outcome: Adequate for Discharge 08/26/2019 0742 by Luna Kitchens, RN Outcome: Progressing   Problem: Safety: Goal: Ability to remain free from injury will improve 08/26/2019 1650 by Luna Kitchens, RN Outcome: Adequate for Discharge 08/26/2019 0742 by Luna Kitchens, RN Outcome: Progressing   Problem: Skin Integrity: Goal: Risk for impaired skin integrity will decrease 08/26/2019 1650 by Luna Kitchens, RN Outcome: Adequate for Discharge 08/26/2019 0742 by Luna Kitchens, RN Outcome: Progressing   Problem: Education: Goal: Required Educational Video(s) 08/26/2019 1650 by Luna Kitchens, RN Outcome: Adequate for Discharge 08/26/2019 0742 by Luna Kitchens, RN Outcome: Progressing   Problem: Clinical Measurements: Goal: Ability to maintain clinical measurements within normal limits will improve 08/26/2019 1650 by Luna Kitchens, RN Outcome: Adequate for Discharge 08/26/2019 0742 by Luna Kitchens, RN Outcome: Progressing Goal: Postoperative complications will be avoided or minimized 08/26/2019 1650 by Luna Kitchens, RN Outcome: Adequate for Discharge 08/26/2019 0742 by Luna Kitchens, RN Outcome: Progressing   Problem: Skin Integrity: Goal: Demonstration of wound healing without infection will improve 08/26/2019 1650 by Luna Kitchens, RN Outcome: Adequate for Discharge 08/26/2019 0742 by Luna Kitchens, RN Outcome: Progressing
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
## 2019-08-26 NOTE — Plan of Care (Signed)
  Problem: Education: Goal: Knowledge of General Education information will improve Description Including pain rating scale, medication(s)/side effects and non-pharmacologic comfort measures Outcome: Progressing   

## 2019-08-26 NOTE — Progress Notes (Signed)
Spoke to Dr. Janee Morn and received verbal order to discontinue the IVC - and was able to print the AVS. Pt discharged home, he was to the front of the hospital with staff.
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
## 2019-08-26 NOTE — Plan of Care (Signed)
  Problem: Safety: Goal: Ability to remain free from injury will improve 08/26/2019 1651 by Luna Kitchens, RN Outcome: Adequate for Discharge 08/26/2019 1650 by Luna Kitchens, RN Outcome: Adequate for Discharge 08/26/2019 0742 by Luna Kitchens, RN Outcome: Progressing   Problem: Education: Goal: Required Educational Video(s) 08/26/2019 1651 by Luna Kitchens, RN Outcome: Adequate for Discharge 08/26/2019 1650 by Luna Kitchens, RN Outcome: Adequate for Discharge 08/26/2019 0742 by Luna Kitchens, RN Outcome: Progressing   Problem: Skin Integrity: Goal: Risk for impaired skin integrity will decrease 08/26/2019 1651 by Luna Kitchens, RN Outcome: Adequate for Discharge 08/26/2019 1650 by Luna Kitchens, RN Outcome: Adequate for Discharge 08/26/2019 0742 by Luna Kitchens, RN Outcome: Progressing   Problem: Clinical Measurements: Goal: Ability to maintain clinical measurements within normal limits will improve 08/26/2019 1651 by Luna Kitchens, RN Outcome: Adequate for Discharge 08/26/2019 1650 by Luna Kitchens, RN Outcome: Adequate for Discharge 08/26/2019 0742 by Luna Kitchens, RN Outcome: Progressing Goal: Postoperative complications will be avoided or minimized 08/26/2019 1651 by Luna Kitchens, RN Outcome: Adequate for Discharge 08/26/2019 1650 by Luna Kitchens, RN Outcome: Adequate for Discharge 08/26/2019 0742 by Luna Kitchens, RN Outcome: Progressing   Problem: Skin Integrity: Goal: Demonstration of wound healing without infection will improve 08/26/2019 1651 by Luna Kitchens, RN Outcome: Adequate for Discharge 08/26/2019 1650 by Luna Kitchens, RN Outcome: Adequate for Discharge 08/26/2019 0742 by Luna Kitchens, RN Outcome: Progressing

## 2019-08-30 ENCOUNTER — Emergency Department (HOSPITAL_COMMUNITY)
Admission: EM | Admit: 2019-08-30 | Discharge: 2019-08-30 | Disposition: A | Discharge status: Home / Self Care | Payer: Federal, State, Local not specified - PPO | Attending: Emergency Medicine | Admitting: Emergency Medicine

## 2019-08-30 ENCOUNTER — Encounter (HOSPITAL_COMMUNITY): Payer: Self-pay

## 2019-08-30 ENCOUNTER — Other Ambulatory Visit: Payer: Self-pay

## 2019-08-30 DIAGNOSIS — Z4801 Encounter for change or removal of surgical wound dressing: Secondary | ICD-10-CM | POA: Insufficient documentation

## 2019-08-30 DIAGNOSIS — Z79899 Other long term (current) drug therapy: Secondary | ICD-10-CM | POA: Diagnosis not present
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
## 2019-08-30 NOTE — Discharge Instructions (Addendum)
Change her dressing every day.  At this point it is okay to get in the shower and wash your abdominal wound, with soap and water.  Do not soak in a bathtub, until the staples have been removed.  After cleansing it, rinse and dry well.  Then apply a light bandage in case it drains or bleeds.  Continue this process every day until you see the nurse for staple removal on June 15.
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
## 2019-10-30 ENCOUNTER — Ambulatory Visit (HOSPITAL_COMMUNITY): Payer: Federal, State, Local not specified - PPO | Admitting: Psychiatry

## 2019-12-05 ENCOUNTER — Other Ambulatory Visit: Payer: Self-pay

## 2019-12-05 ENCOUNTER — Emergency Department (HOSPITAL_COMMUNITY)
Admission: EM | Admit: 2019-12-05 | Discharge: 2019-12-06 | Disposition: A | Discharge status: Home / Self Care | Payer: Federal, State, Local not specified - PPO | Attending: Emergency Medicine | Admitting: Emergency Medicine

## 2019-12-05 DIAGNOSIS — Z79899 Other long term (current) drug therapy: Secondary | ICD-10-CM | POA: Insufficient documentation

## 2019-12-05 DIAGNOSIS — F22 Delusional disorders: Secondary | ICD-10-CM | POA: Diagnosis present

## 2019-12-05 DIAGNOSIS — F23 Brief psychotic disorder: Secondary | ICD-10-CM | POA: Insufficient documentation

## 2019-12-05 DIAGNOSIS — Z20822 Contact with and (suspected) exposure to covid-19: Secondary | ICD-10-CM | POA: Diagnosis not present

## 2019-12-05 LAB — CBC
HCT: 40.5 % (ref 39.0–52.0)
Hemoglobin: 12 g/dL — ABNORMAL LOW (ref 13.0–17.0)
MCH: 22.3 pg — ABNORMAL LOW (ref 26.0–34.0)
MCHC: 29.6 g/dL — ABNORMAL LOW (ref 30.0–36.0)
MCV: 75.4 fL — ABNORMAL LOW (ref 80.0–100.0)
Platelets: 283 10*3/uL (ref 150–400)
RBC: 5.37 MIL/uL (ref 4.22–5.81)
RDW: 13.3 % (ref 11.5–15.5)
WBC: 8.6 10*3/uL (ref 4.0–10.5)
nRBC: 0 % (ref 0.0–0.2)

## 2019-12-05 LAB — COMPREHENSIVE METABOLIC PANEL
ALT: 16 U/L (ref 0–44)
AST: 24 U/L (ref 15–41)
Albumin: 4.4 g/dL (ref 3.5–5.0)
Alkaline Phosphatase: 63 U/L (ref 38–126)
Anion gap: 10 (ref 5–15)
BUN: 12 mg/dL (ref 6–20)
CO2: 26 mmol/L (ref 22–32)
Calcium: 9.4 mg/dL (ref 8.9–10.3)
Chloride: 106 mmol/L (ref 98–111)
Creatinine, Ser: 0.94 mg/dL (ref 0.61–1.24)
GFR calc Af Amer: >60 mL/min (ref >60)
GFR calc non Af Amer: >60 mL/min (ref >60)
Glucose, Bld: 96 mg/dL (ref 70–99)
Potassium: 3.8 mmol/L (ref 3.5–5.1)
Sodium: 142 mmol/L (ref 135–145)
Total Bilirubin: 0.5 mg/dL (ref 0.3–1.2)
Total Protein: 6.6 g/dL (ref 6.5–8.1)

## 2019-12-05 LAB — SALICYLATE LEVEL: Salicylate Lvl: <7 mg/dL — ABNORMAL LOW (ref 7.0–30.0)

## 2019-12-05 LAB — SARS CORONAVIRUS 2 BY RT PCR (HOSPITAL ORDER, PERFORMED IN ~~LOC~~ HOSPITAL LAB): SARS Coronavirus 2: NEGATIVE

## 2019-12-05 LAB — RAPID URINE DRUG SCREEN, HOSP PERFORMED
Amphetamines: NOT DETECTED
Barbiturates: NOT DETECTED
Benzodiazepines: NOT DETECTED
Cocaine: NOT DETECTED
Opiates: NOT DETECTED
Tetrahydrocannabinol: POSITIVE — AB

## 2019-12-05 LAB — ETHANOL: Alcohol, Ethyl (B): <10 mg/dL (ref <10)

## 2019-12-05 LAB — ACETAMINOPHEN LEVEL: Acetaminophen (Tylenol), Serum: <10 ug/mL — ABNORMAL LOW (ref 10–30)

## 2019-12-05 MED ORDER — RISPERIDONE 1 MG PO TABS
2.0000 mg | ORAL_TABLET | Freq: Every day | ORAL | Status: DC
  Administered 2019-12-05: 2 mg via ORAL
  Filled 2019-12-05: qty 2

## 2019-12-05 MED ORDER — BENZTROPINE MESYLATE 1 MG PO TABS
1.0000 mg | ORAL_TABLET | Freq: Every day | ORAL | Status: DC
  Administered 2019-12-05: 1 mg via ORAL
  Filled 2019-12-05: qty 1
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
## 2019-12-05 NOTE — ED Notes (Signed)
Pt sleeping at this time rise and fall of chest noted

## 2019-12-05 NOTE — ED Triage Notes (Signed)
Reported concern for suicidal ideation and hallucinations. Patient denies and stated "this are false accusations"

## 2019-12-05 NOTE — BH Assessment (Signed)
Comprehensive Clinical Assessment (CCA) Note  12/05/2019 Garrett Bolton [id number]  Garrett Bolton is a 19yo male with 5 visits to ED for psychosis-related symptoms in 2021. Pt was IVC by sister for SI and hallucinations. LCSW attempted to get collateral information from sister unsuccessfully.  Pt denies SI or recent substance use. "I used to do that stuff but I stopped doing it".  Pt is poor historian and gave LCSW conflicting information.   Pt denies SI, HI and reports that he is not a danger to himself or anyone else. Pt denies mental health treatment in the past. Pt denies having a psychiatrist but reports that he has a Administrator" that he meets with two times per week. Pt reports that he graduated high school and wants to attend college once he finds the right fit. Pt denies significant depression or anxiety symptoms--pt states that he has some situational anxiety that he is able to manage on his own.   Pt admits that there are spirits in his sisters house that he hears talking inside of his head, especially when he goes to sleep. Pt reports that he often doesn't want to sleep because that is when he hears the voices. Pt admits that he once drove his car to [place] to escape the voices and he could still hear them.  Pt denies that they tell him to do things or talk directly to him.  Pt feels that other people can hear his thoughts sometimes and that people can control him through his thoughts.    Disoposition: per Gillermo Murdoch FNP pt meets inpatient criteria.  Visit Diagnosis:      ICD-10-CM   1. Paranoia (psychosis) (HCC)  F22       CCA Screening, Triage and Referral (STR)  Patient Reported Information How did you hear about [place]? Family/Friend  Referral name: Sister IVC  Referral phone number: No data recorded  Whom do you see for routine medical problems? No data recorded Practice/Facility Name: No data recorded Practice/Facility Phone Number: No data recorded Name of  Contact: No data recorded Contact Number: No data recorded Contact Fax Number: No data recorded Prescriber Name: No data recorded Prescriber Address (if known): No data recorded  What Is the Reason for Your Visit/Call Today? Sister is concerned about pts state of mind.  How Long Has This Been Causing You Problems? <Week  What Do You Feel Would Help You the Most Today? No data recorded  Have You Recently Been in Any Inpatient Treatment (Hospital/Detox/Crisis Center/28-Day Program)? No  Name/Location of Program/Hospital:No data recorded How Long Were You There? No data recorded When Were You Discharged? No data recorded  Have You Ever Received Services From [facility] Before? Yes  Who Do You See at [facility]? No data recorded  Have You Recently Had Any Thoughts About Hurting Yourself? Yes  Are You Planning to Commit Suicide/Harm Yourself At This time? No   Have you Recently Had Thoughts About Hurting Someone [name]? No  Explanation: No data recorded  Have You Used Any Alcohol or Drugs in the Past 24 Hours? No (pt denied any drug use at time of assessment)  How Long Ago Did You Use Drugs or Alcohol? No data recorded What Did You Use and How Much? No data recorded  Do You Currently Have a Therapist/Psychiatrist? No (pt does have a Administrator" that he meets with two times per week)  Name of Therapist/Psychiatrist: No data recorded  Have You Been Recently Discharged From Any Office Practice  or Programs? No  Explanation of Discharge From Practice/Program: No data recorded    CCA Screening Triage Referral Assessment Type of Contact: Tele-Assessment  Is this Initial or Reassessment? Initial Assessment  Date Telepsych consult ordered in [facility]:  12/05/19  Time Telepsych consult ordered in [facility]:  1532   Patient Reported Information Reviewed? No data recorded Patient Left Without Being Seen? No data recorded Reason for Not Completing Assessment: No data  recorded  Collateral Involvement: unable to contact   Does Patient Have a Court Appointed Legal Guardian? No data recorded Name and Contact of Legal Guardian: Self  If Minor and Not Living with Parent(s), Who has Custody? NA  Is CPS involved or ever been involved? Never  Is APS involved or ever been involved? Never   Patient Determined To Be At Risk for Harm To Self or Others Based on Review of Patient Reported Information or Presenting Complaint? No data recorded Method: No data recorded Availability of Means: No data recorded Intent: No data recorded Notification Required: No data recorded Additional Information for Danger to Others Potential: No data recorded Additional Comments for Danger to Others Potential: No data recorded Are There Guns or Other Weapons in Your Home? No  Types of Guns/Weapons: No data recorded Are These Weapons Safely Secured?                            No data recorded Who Could Verify You Are Able To Have These Secured: No data recorded Do You Have any Outstanding Charges, Pending Court Dates, Parole/Probation? No data recorded Contacted To Inform of Risk of Harm To Self or Others: No data recorded  Location of Assessment: [facility] ED   Does Patient Present under Involuntary Commitment? Yes  IVC Papers Initial File Date: 12/05/19   [place] of Residence: [place]   Patient Currently Receiving the Following Services: Not Receiving Services   Determination of Need: No data recorded  Options For Referral: Inpatient Hospitalization    CCA Biopsychosocial  Intake/Chief Complaint:  CCA Intake With Chief Complaint CCA Part Two Date: 12/05/19 CCA Part Two Time: 2000 Chief Complaint/Presenting Problem: Pts sister had pt IVC for SI and hallucinations Patient's Currently Reported Symptoms/Problems: Pt reports that he hears voices of spirits inside his head. Pt reports that he "drove to [place] to get away from the spirit voices at my sister's house, but  I could still hear them in [place]".  Pt denies any SI at time of assessment. Pt also denied any substance use a time of assessment. Type of Services Patient Feels Are Needed: pt unsure what is needed Initial Clinical Notes/Concerns: Pt having AVH, delusions. Pt feels that others can hear his thoughts and control him through his thoughts.  Mental Health Symptoms Depression:  Depression: Weight gain/loss  Mania:  Mania: Irritability, Racing thoughts  Anxiety:      Psychosis:  Psychosis: Affective flattening/alogia/avolition, Hallucinations, Duration of symptoms greater than six months (hears voices of spirits--non command hallucinations)  Trauma:  Trauma: N/A  Obsessions:  Obsessions: N/A  Compulsions:  Compulsions: N/A  Inattention:  Inattention: N/A  Hyperactivity/Impulsivity:  Hyperactivity/Impulsivity: N/A  Oppositional/Defiant Behaviors:  Oppositional/Defiant Behaviors: N/A  Emotional Irregularity:  Emotional Irregularity: N/A  Other Mood/Personality Symptoms:      Mental Status Exam Appearance and self-care  Stature:  Stature: Average  Weight:  Weight: Underweight  Clothing:  Clothing: Neat/clean  Grooming:  Grooming: Normal  Cosmetic use:  Cosmetic Use: None  Posture/gait:  Posture/Gait:  Normal  Motor activity:  Motor Activity: Not Remarkable  Sensorium  Attention:  Attention: Normal  Concentration:  Concentration: Normal  Orientation:  Orientation: X5  Recall/memory:  Recall/Memory: Defective in Short-term (Pt poor historian)  Affect and Mood  Affect:  Affect: Blunted, Flat  Mood:  Mood: Depressed (pt denies depression)  Relating  Eye contact:  Eye Contact: Normal  Facial expression:  Facial Expression: Responsive  Attitude toward examiner:  Attitude Toward Examiner: Cooperative  Thought and Language  Speech flow: Speech Flow: Soft, Clear and Coherent  Thought content:  Thought Content: Delusions, Persecutions, Suspicious (delusion of control, delusion of mind reading)   Preoccupation:  Preoccupations: Ruminations (voices in head "spirit voices")  Hallucinations:  Hallucinations: Auditory  Organization:     Company secretary of Knowledge:  Fund of Knowledge: Average  Intelligence:  Intelligence: Average  Abstraction:  Abstraction: Normal  Judgement:  Judgement: Impaired  Reality Testing:  Reality Testing: Unaware  Insight:  Insight: Gaps  Decision Making:  Decision Making: Impulsive  Social Functioning  Social Maturity:  Social Maturity: Isolates  Social Judgement:  Social Judgement: "Chief of Staff", Victimized  Stress  Stressors:  Stressors: Family conflict, Work, Publishing copy Ability:  Coping Ability: Deficient supports  Neurosurgeon:  Skill Deficits: Scientist, physiological, Interpersonal, Responsibility  Supports:  Supports: Family, Church     Religion: Religion/Spirituality Are You A Religious Person?: Yes  Leisure/Recreation: Leisure / Recreation Do You Have Hobbies?: No  Exercise/Diet: Exercise/Diet Do You Exercise?: Yes What Type of Exercise Do You Do?: Run/Walk Have You Gained or Lost A Significant Amount of Weight in the Past Six Months?: Yes-Lost Number of Pounds Lost?:  (pt lost weight after recent surgery) Do You Follow a Special Diet?: No Do You Have Any Trouble Sleeping?: Yes Explanation of Sleeping Difficulties: insomnia most days   Recommendations for Services/Supports/Treatments:   Per Gillermo Murdoch FNP pt meets inpatient criteria  Garrett Bolton

## 2019-12-05 NOTE — ED Notes (Signed)
Belongings placed in locker 2.  

## 2019-12-05 NOTE — BH Assessment (Signed)
Per Shane Crutch FNP, pt meets inpatient criteria. Disposition counselor notified AC of patient's need for bed placement on the adult unit. Awaiting AC to review patient for possibility of bed placement at [facility].

## 2019-12-05 NOTE — ED Triage Notes (Signed)
Emergency Medicine Provider Triage Evaluation Note  Garrett Bolton , a 19 y.o. male  was evaluated in triage.  Pt presenting for psychiatric evaluation.  Patient states there is nothing wrong and he does not need to be here.  IVC paperwork pulled out due to concerns for SI and hallucinations.  Patient states this is.  Physician.  He reports his mood has been spiked by [place] and he smokes marijuana.  Denies other drug use.  He denies SI, HI, AVH to me.  Review of Systems  Positive: Psych eval.  Polysubstance abuse. Negative: SI, HI, AVH  Physical Exam  There were no vitals taken for this visit. Gen:   Awake, no distress   HEENT:  Atraumatic  Resp:  Normal effort  Cardiac:  Normal rate  Abd:   Nondistended, nontender  MSK:   Moves extremities without difficulty  Neuro:  Speech clear Psych:  denies SI, HI, AVH. Tangential thoughts. Paranoid (pt convinced we are only getting blood to look for drugs)  Medical Decision Making  Medically screening exam initiated at 3:28 PM.  Appropriate orders placed.  Garrett Bolton was informed that the remainder of the evaluation will be completed by another provider, this initial triage assessment does not replace that evaluation, and the importance of remaining in the ED until their evaluation is complete.  Clinical Impression    Patient here for psychiatric evaluation.  On my exam, he is denying any psych issues.  However he has tangential thoughts, is convinced we are getting blood only to assess for drug use.  He is concerned that since he donated plasma recently, that this will be too much blood to take.  Patient is medically cleared, screening labs ordered.  TTS consulted.   Alveria Apley, PA-C 12/05/19 1533

## 2019-12-05 NOTE — ED Notes (Signed)
Valuables envelope given to security

## 2019-12-06 NOTE — ED Notes (Signed)
Breakfast Ordered 

## 2019-12-06 NOTE — ED Provider Notes (Signed)
At this time patient will be discharged to go to [facility] for further managements of his psychiatric illness.   Fayrene Helper, PA-C 12/06/19 0829    Virgina Norfolk, DO 12/06/19 8250

## 2019-12-06 NOTE — Discharge Instructions (Signed)
You are being discharged to [facility] for further management of your psychiatric illness.

## 2019-12-06 NOTE — ED Notes (Signed)
Pt accepted at [facility]. Accepting Md is Dr. Iva Lento. Pt to go to the [facility] after 9-10 am. Report @ [phone].

## 2019-12-06 NOTE — ED Notes (Signed)
Patient verbalizes understanding of discharge instructions. Opportunity for questioning and answers were provided. Armband removed by staff, pt transferred from ED to [facility] via  [facility] Office.

## 2019-12-06 NOTE — ED Notes (Signed)
IVC papers and chart faxed to [facility] per request.

## 2019-12-23 ENCOUNTER — Ambulatory Visit (HOSPITAL_COMMUNITY)
Admission: EM | Admit: 2019-12-23 | Discharge: 2019-12-23 | Disposition: A | Discharge status: Home / Self Care | Payer: Federal, State, Local not specified - PPO | Attending: Family Medicine | Admitting: Family Medicine

## 2019-12-23 ENCOUNTER — Encounter (HOSPITAL_COMMUNITY): Payer: Self-pay

## 2019-12-23 ENCOUNTER — Other Ambulatory Visit: Payer: Self-pay

## 2019-12-23 DIAGNOSIS — D573 Sickle-cell trait: Secondary | ICD-10-CM | POA: Insufficient documentation

## 2019-12-23 DIAGNOSIS — R0982 Postnasal drip: Secondary | ICD-10-CM | POA: Insufficient documentation

## 2019-12-23 DIAGNOSIS — R110 Nausea: Secondary | ICD-10-CM | POA: Insufficient documentation

## 2019-12-23 DIAGNOSIS — R509 Fever, unspecified: Secondary | ICD-10-CM | POA: Insufficient documentation

## 2019-12-23 DIAGNOSIS — U071 COVID-19: Secondary | ICD-10-CM | POA: Insufficient documentation

## 2019-12-23 LAB — SARS CORONAVIRUS 2 (TAT 6-24 HRS): SARS Coronavirus 2: POSITIVE — AB

## 2019-12-23 NOTE — Discharge Instructions (Addendum)
You have been tested for COVID-19 today. °If your test returns positive, you will receive a phone call from [facility] regarding your results. °Negative test results are not called. °Both positive and negative results area always visible on MyChart. °If you do not have a MyChart account, sign up instructions are provided in your discharge papers. °Please do not hesitate to contact us should you have questions or concerns. ° °

## 2019-12-23 NOTE — ED Triage Notes (Signed)
Pt presents with fever, nausea, nasal drainage, and headache since yesterday.

## 2019-12-24 ENCOUNTER — Telehealth: Payer: Self-pay | Admitting: Nurse Practitioner

## 2019-12-24 NOTE — ED Provider Notes (Signed)
[facility]   [id number] 12/23/19 Arrival Time: 1546  ASSESSMENT & PLAN:  1. Subjective fever   2. Nausea   3. Post-nasal drainage      COVID-19 testing sent. See letter/work note on file for self-isolation guidelines. OTC symptom care as needed.    Follow-up Information    Garrett Paling, MD.   Specialty: Pediatrics Why: As needed. Contact information: [address] [place] [address] [phone]               Reviewed expectations re: course of current medical issues. Questions answered. Outlined signs and symptoms indicating need for more acute intervention. Understanding verbalized. After Visit Summary given.   SUBJECTIVE: History from: patient. Garrett Bolton is a 19 y.o. male who presents with worries regarding COVID-19. Known COVID-19 contact: questionable family member. Recent travel: none. Reports: nasal drainage, headache, subj fever for 1 day. Denies: difficulty breathing. Normal PO intake without n/v/d.    OBJECTIVE:  Vitals:   12/23/19 1750  BP: 114/73  Pulse: 93  Resp: 20  Temp: 100.1 F (37.8 C)  TempSrc: Oral  SpO2: 99%    General appearance: alert; no distress Eyes: PERRLA; EOMI; conjunctiva normal HENT: ; AT; with nasal congestion Neck: supple  Lungs: speaks full sentences without difficulty; unlabored Extremities: no edema Skin: warm and dry Neurologic: normal gait Psychological: alert and cooperative; normal mood and affect  Labs:  Labs Reviewed  SARS CORONAVIRUS 2 (TAT 6-24 HRS) - Abnormal; Notable for the following components:      Result Value   SARS Coronavirus 2 POSITIVE (*)    All other components within normal limits     No Known Allergies  Past Medical History:  Diagnosis Date  . Anemia   . Sickle cell trait (HCC)    Social History   Socioeconomic History  . Marital status: Single    Spouse name: Not on file  . Number of children: Not on file  . Years of education:  Not on file  . Highest education level: Not on file  Occupational History  . Not on file  Tobacco Use  . Smoking status: Never Smoker  . Smokeless tobacco: Never Used  Substance and Sexual Activity  . Alcohol use: No  . Drug use: No  . Sexual activity: Never  Other Topics Concern  . Not on file  Social History Narrative  . Not on file   Social Determinants of Health   Financial Resource Strain:   . Difficulty of Paying Living Expenses: Not on file  Food Insecurity:   . Worried About Programme researcher, broadcasting/film/video in the Last Year: Not on file  . Ran Out of Food in the Last Year: Not on file  Transportation Needs:   . Lack of Transportation (Medical): Not on file  . Lack of Transportation (Non-Medical): Not on file  Physical Activity:   . Days of Exercise per Week: Not on file  . Minutes of Exercise per Session: Not on file  Stress:   . Feeling of Stress : Not on file  Social Connections:   . Frequency of Communication with Friends and Family: Not on file  . Frequency of Social Gatherings with Friends and Family: Not on file  . Attends Religious Services: Not on file  . Active Member of Clubs or Organizations: Not on file  . Attends Banker Meetings: Not on file  . Marital Status: Not on file  Intimate Partner Violence:   .  Fear of Current or Ex-Partner: Not on file  . Emotionally Abused: Not on file  . Physically Abused: Not on file  . Sexually Abused: Not on file   History reviewed. No pertinent family history. Past Surgical History:  Procedure Laterality Date  . CIRCUMCISION  2001  . LAPAROTOMY N/A 08/23/2019   Procedure: EXPLORATORY LAPAROTOMY;  Surgeon: Violeta Gelinas, MD;  Location: [facility] OR;  Service: General;  Laterality: Vertis Kelch, MD 12/24/19 [phone]

## 2019-12-24 NOTE — Telephone Encounter (Signed)
I called Jolly Mango to discuss Covid symptoms and the use of casirivimab/imdevimab, a monoclonal antibody infusion for those with mild to moderate Covid symptoms and at a high risk of hospitalization.    Pt is qualified for this infusion at the monoclonal antibody infusion center due to co-morbid conditions and/or a member of an at-risk group, however would like to think more about the infusion at this time. Symptoms tier reviewed as well as criteria for ending isolation.  Symptoms reviewed that would warrant ED/Hospital evaluation. Preventative practices reviewed. Patient verbalized understanding. Patient advised to call back if he decides that he does want to get infusion. Callback number to the infusion center given. Patient advised to go to Urgent care or ED with severe symptoms. Last date pt would be eligible for infusion is 10/13.     Nicolasa Ducking, NP

## 2020-02-25 ENCOUNTER — Encounter (HOSPITAL_COMMUNITY): Payer: Self-pay

## 2020-02-25 ENCOUNTER — Other Ambulatory Visit: Payer: Self-pay

## 2020-02-25 ENCOUNTER — Emergency Department (EMERGENCY_DEPARTMENT_HOSPITAL)
Admission: EM | Admit: 2020-02-25 | Discharge: 2020-02-26 | Disposition: A | Discharge status: Other Acute Inpatient Hospital | Payer: Federal, State, Local not specified - PPO | Source: Home / Self Care | Attending: Emergency Medicine | Admitting: Emergency Medicine

## 2020-02-25 DIAGNOSIS — F29 Unspecified psychosis not due to a substance or known physiological condition: Secondary | ICD-10-CM | POA: Diagnosis not present

## 2020-02-25 DIAGNOSIS — F22 Delusional disorders: Secondary | ICD-10-CM

## 2020-02-25 DIAGNOSIS — Z046 Encounter for general psychiatric examination, requested by authority: Secondary | ICD-10-CM | POA: Insufficient documentation

## 2020-02-25 DIAGNOSIS — R4689 Other symptoms and signs involving appearance and behavior: Secondary | ICD-10-CM

## 2020-02-25 DIAGNOSIS — Z20822 Contact with and (suspected) exposure to covid-19: Secondary | ICD-10-CM | POA: Insufficient documentation

## 2020-02-25 DIAGNOSIS — R456 Violent behavior: Secondary | ICD-10-CM | POA: Insufficient documentation

## 2020-02-25 DIAGNOSIS — F200 Paranoid schizophrenia: Secondary | ICD-10-CM | POA: Diagnosis not present

## 2020-02-25 DIAGNOSIS — F17200 Nicotine dependence, unspecified, uncomplicated: Secondary | ICD-10-CM | POA: Insufficient documentation

## 2020-02-25 DIAGNOSIS — F12951 Cannabis use, unspecified with psychotic disorder with hallucinations: Secondary | ICD-10-CM | POA: Diagnosis present

## 2020-02-25 DIAGNOSIS — F209 Schizophrenia, unspecified: Secondary | ICD-10-CM | POA: Insufficient documentation

## 2020-02-25 LAB — CBC
HCT: 44.2 % (ref 39.0–52.0)
Hemoglobin: 13.6 g/dL (ref 13.0–17.0)
MCH: 23.4 pg — ABNORMAL LOW (ref 26.0–34.0)
MCHC: 30.8 g/dL (ref 30.0–36.0)
MCV: 75.9 fL — ABNORMAL LOW (ref 80.0–100.0)
Platelets: 314 10*3/uL (ref 150–400)
RBC: 5.82 MIL/uL — ABNORMAL HIGH (ref 4.22–5.81)
RDW: 12.7 % (ref 11.5–15.5)
WBC: 11.6 10*3/uL — ABNORMAL HIGH (ref 4.0–10.5)
nRBC: 0 % (ref 0.0–0.2)

## 2020-02-25 LAB — RAPID URINE DRUG SCREEN, HOSP PERFORMED
Amphetamines: NOT DETECTED
Barbiturates: NOT DETECTED
Benzodiazepines: NOT DETECTED
Cocaine: NOT DETECTED
Opiates: NOT DETECTED
Tetrahydrocannabinol: POSITIVE — AB

## 2020-02-25 LAB — COMPREHENSIVE METABOLIC PANEL
ALT: 16 U/L (ref 0–44)
AST: 23 U/L (ref 15–41)
Albumin: 4.8 g/dL (ref 3.5–5.0)
Alkaline Phosphatase: 70 U/L (ref 38–126)
Anion gap: 6 (ref 5–15)
BUN: 22 mg/dL — ABNORMAL HIGH (ref 6–20)
CO2: 28 mmol/L (ref 22–32)
Calcium: 9.4 mg/dL (ref 8.9–10.3)
Chloride: 107 mmol/L (ref 98–111)
Creatinine, Ser: 0.93 mg/dL (ref 0.61–1.24)
GFR, Estimated: >60 mL/min (ref >60)
Glucose, Bld: 101 mg/dL — ABNORMAL HIGH (ref 70–99)
Potassium: 3.7 mmol/L (ref 3.5–5.1)
Sodium: 141 mmol/L (ref 135–145)
Total Bilirubin: 0.8 mg/dL (ref 0.3–1.2)
Total Protein: 6.8 g/dL (ref 6.5–8.1)

## 2020-02-25 LAB — RESP PANEL BY RT-PCR (FLU A&B, COVID) ARPGX2
Influenza A by PCR: NEGATIVE
Influenza B by PCR: NEGATIVE
SARS Coronavirus 2 by RT PCR: NEGATIVE

## 2020-02-25 LAB — ACETAMINOPHEN LEVEL: Acetaminophen (Tylenol), Serum: <10 ug/mL — ABNORMAL LOW (ref 10–30)

## 2020-02-25 LAB — SALICYLATE LEVEL: Salicylate Lvl: <7 mg/dL — ABNORMAL LOW (ref 7.0–30.0)

## 2020-02-25 LAB — ETHANOL: Alcohol, Ethyl (B): <10 mg/dL (ref <10)
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
## 2020-02-25 NOTE — ED Notes (Signed)
TTS at bedside. [employer] at bedside.

## 2020-02-25 NOTE — ED Notes (Addendum)
Pt. In burgundy scrubs.  Pt. wanded by security. Pt. Belongings locked up in cabinet 5-8. Pt. Has 2 belongings bags. Pt. Has 1 black jacket, 1 pr, black boots, 1 blue jean pant. No cell phone and no wallet.
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
## 2020-02-25 NOTE — ED Notes (Signed)
Pt.made aware for the need of urine specimen. 

## 2020-02-26 DIAGNOSIS — F12951 Cannabis use, unspecified with psychotic disorder with hallucinations: Secondary | ICD-10-CM

## 2020-02-26 MED ORDER — OMEGA-3-ACID ETHYL ESTERS 1 G PO CAPS
1.0000 g | ORAL_CAPSULE | Freq: Two times a day (BID) | ORAL | Status: DC
  Filled 2020-02-26 (×2): qty 1

## 2020-02-26 MED ORDER — DIVALPROEX SODIUM 500 MG PO DR TAB
500.0000 mg | DELAYED_RELEASE_TABLET | Freq: Two times a day (BID) | ORAL | Status: DC
  Filled 2020-02-26 (×2): qty 1

## 2020-02-26 MED ORDER — RISPERIDONE 1 MG PO TABS
1.0000 mg | ORAL_TABLET | Freq: Every day | ORAL | Status: DC
  Filled 2020-02-26 (×2): qty 1

## 2020-02-26 MED ORDER — BENZTROPINE MESYLATE 1 MG PO TABS
1.0000 mg | ORAL_TABLET | Freq: Every day | ORAL | Status: DC
  Filled 2020-02-26 (×2): qty 1

## 2020-02-26 NOTE — Progress Notes (Signed)
Pt accepted to   [facility], [address] [place]  Bed 500-1  Nira Conn, NP is the accepting provider.    Dr. Jola Babinski is the attending provider.    Call report to  [phone]    [facility] @ [facility] ED notified.     Pt is IVC .    Pt may be transported by  [facility]   Pt scheduled  to arrive at [facility] tonight.  Please call report for time of arrival.    Ladoris Gene [facility] Clinical Social Worker  [place] Disposition [phone] (cell)
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
## 2020-02-26 NOTE — ED Notes (Signed)
Sitter at bedside.

## 2020-02-26 NOTE — ED Notes (Signed)
Patient no longer being cooperative with RN. Patient refusing medications. Patient calling it "Devil pills" and saying he is about to "Turn Up". Patient is coming out of his room and sitting in chair outside of room refusing to go back in room.

## 2020-02-26 NOTE — ED Notes (Signed)
Patient given snack foods as he reports he has not eaten much today. Patient provided comforting words of support. Patient denies further needs at this time.

## 2020-02-26 NOTE — ED Notes (Signed)
Per Staff at [facility], patient may be transported to [facility] anytime after 11pm. [employer] called for transport.

## 2020-02-26 NOTE — BH Assessment (Signed)
Tele Assessment Note   Patient Name: Garrett Bolton MRN: [id number] Referring Physician: Lyndel Safe, PA Location of Patient: [facility] Location of Provider: Behavioral Health TTS Department  Garrett Bolton is an 19 y.o. male.  -Clinician reviewed note by Lyndel Safe, PA.  Patient is a 19 year old man who presents today under IVC for evaluation.  He has reportedly been emptying to fight his family members and making bizarre statements such as that they spent in his mouth while he was asleep to give him herpes. On my exam he appears delusional and paranoid.  He inappropriately laughs at times and his answers are delayed and tangential.  Patient denies any SI, HI or A/V hallucinations.  He does say that he came to [facility] to be tested for herpes.  He believes that he may have contracted it because of a hair on his cigarette.   Pt did not want clinician to talk to his sister.  He would not say that they were arguing but indicated they were not getting along.  Patient said it was okay to talk to his mother.  Patient does have delusional thoughts.  According to IVC papers he has been accusing sister and her boyfriend of trying to give him herpes by spitting in his mouth.  He has good eye contact and is oriented x4.  He speaks slowly and is guarded in his responses.  Patient has delusional thoughts and is tangential.  He reports poor appetite.  He says he gets 8 hours sleep.  Clinician talked to mother, Domenico Achord who said that patient last week had asked her "what can I do to end my soul?"  He also had told her "you'll know when I am gone."  She said that sister told her that patient threatened to kill her boyfriend today.  Patient has not been eating, he is not on any medication and has not been sleeping well.  Mother said that patient has torn things up and broken a window in sister's home.  Patient has also attempted to get them to fight him also.  Patient has, according to  mother, been inpatient at [facility] about a month ago.  Patient has been at [facility] [place] in 04/2019.  No current outpatient provider.  -Clinician discussed patient care with Nira Conn, FNP who recommends inpatient psychiatric care.  AC Fransico Michael said there were no appropriate beds at [facility] tonight. CSW to seek placement.  Diagnosis: Schizophrenia  Past Medical History:  Past Medical History:  Diagnosis Date  . Anemia   . Sickle cell trait [facility])     Past Surgical History:  Procedure Laterality Date  . CIRCUMCISION  2001  . LAPAROTOMY N/A 08/23/2019   Procedure: EXPLORATORY LAPAROTOMY;  Surgeon: Violeta Gelinas, MD;  Location: [facility] OR;  Service: General;  Laterality: N/A;    Family History: No family history on file.  Social History:  reports that he has been smoking. He has never used smokeless tobacco. He reports current drug use. Drug: Methamphetamines. He reports that he does not drink alcohol.  Additional Social History:  Alcohol / Drug Use Pain Medications: None Prescriptions: None Over the Counter: None History of alcohol / drug use?: No history of alcohol / drug abuse (Pt denies using drugs or ETOH.)  CIWA: CIWA-Ar BP: (!) 147/87 Pulse Rate: 61 COWS:    Allergies: No Known Allergies  Home Medications: (Not in a hospital admission)   OB/GYN Status:  No LMP for male patient.  General Assessment Data Location of  Assessment: [facility] TTS Assessment: In system Is this a Tele or Face-to-Face Assessment?: Tele Assessment Is this an Initial Assessment or a Re-assessment for this encounter?: Initial Assessment Patient Accompanied by:: N/A Language Other than English: No Living Arrangements: Other (Comment) (Lving w/ sister for last 4 months.) What gender do you identify as?: Male Date Telepsych consult ordered in [facility]: 02/25/20 Time Telepsych consult ordered in [facility]: 2045 Marital status: Single Pregnancy Status: No Living Arrangements: Other relatives (Living with sister) Can pt  return to current living arrangement?: Yes Admission Status: Involuntary Petitioner: Family member Is patient capable of signing voluntary admission?: No Referral Source: Self/Family/Friend Insurance type: BC/BS &  MCD     Crisis Care Plan Living Arrangements: Other relatives (Living with sister) Name of Psychiatrist: None Name of Therapist: None  Education Status Is patient currently in school?: No Current Grade: N/A Highest grade of school patient has completed: N/A Name of school: N/A Contact person: N/A IEP information if applicable: N/A Is the patient employed, unemployed or receiving disability?: Employed  Risk to self with the past 6 months Suicidal Ideation: No (Per mother has mentioned dying w/in the last week.) Has patient been a risk to self within the past 6 months prior to admission? : No Suicidal Intent: No (Pt denies.  Mother worried he intends to kill self.) Has patient had any suicidal intent within the past 6 months prior to admission? : No Is patient at risk for suicide?: Yes (Pt will provoke things he thinks may lead to death per mothe) Suicidal Plan?: No Has patient had any suicidal plan within the past 6 months prior to admission? : No Access to Means: No What has been your use of drugs/alcohol within the last 12 months?: Denies Previous Attempts/Gestures: No How many times?: 0 Other Self Harm Risks: None Triggers for Past Attempts: None known Intentional Self Injurious Behavior: None Family Suicide History: No Recent stressful life event(s): Turmoil (Comment), Conflict (Comment) (Conflict with sister) Persecutory voices/beliefs?: Yes Depression: No Depression Symptoms:  (Pt denies depressive symptoms.) Substance abuse history and/or treatment for substance abuse?: No Suicide prevention information given to non-admitted patients: Not applicable  Risk to Others within the past 6 months Homicidal Ideation: No (Sister said pt threatened to kill her  boyfriend.) Does patient have any lifetime risk of violence toward others beyond the six months prior to admission? : No Thoughts of Harm to Others: No (Denies but has tried to start fights w/ sister & her boyfrie) Current Homicidal Intent: No Current Homicidal Plan: No Access to Homicidal Means: No Identified Victim: No one History of harm to others?: No Assessment of Violence: None Noted Violent Behavior Description: Pt denies Does patient have access to weapons?: No Criminal Charges Pending?: No Does patient have a court date: No Is patient on probation?: Yes  Psychosis Hallucinations: None noted (Per IVC has had some hallucinations.) Delusions: Persecutory  Mental Status Report Appearance/Hygiene: Unremarkable, In scrubs Eye Contact: Good Motor Activity: Unremarkable Speech: Soft, Tangential Level of Consciousness: Alert Mood: Apprehensive Affect: Apprehensive Anxiety Level: Moderate Thought Processes: Coherent, Relevant Judgement: Impaired Orientation: Person, Situation, Time, Place Obsessive Compulsive Thoughts/Behaviors: None  Cognitive Functioning Concentration: Good Memory: Recent Intact, Remote Intact Is patient IDD: No Insight: Poor Impulse Control: Poor Appetite: Fair Have you had any weight changes? : Loss Amount of the weight change? (lbs):  (6 lbs in last week?) Sleep: No Change Total Hours of Sleep: 8 Vegetative Symptoms: None  ADLScreening [facility] Assessment Services) Patient's cognitive ability adequate to  safely complete daily activities?: Yes Patient able to express need for assistance with ADLs?: Yes Independently performs ADLs?: Yes (appropriate for developmental age)  Prior Inpatient Therapy Prior Inpatient Therapy: Yes Prior Therapy Dates: A month ago Prior Therapy Facilty/Provider(s): [facility] Reason for Treatment: psychosis  Prior Outpatient Therapy Prior Outpatient Therapy: No Does patient have an ACCT team?: No Does patient have  Intensive In-House Services?  : No Does patient have [facility] services? : No Does patient have P4CC services?: No  ADL Screening (condition at time of admission) Patient's cognitive ability adequate to safely complete daily activities?: Yes Is the patient deaf or have difficulty hearing?: No Does the patient have difficulty seeing, even when wearing glasses/contacts?: No Does the patient have difficulty concentrating, remembering, or making decisions?: No Patient able to express need for assistance with ADLs?: Yes Does the patient have difficulty dressing or bathing?: No Independently performs ADLs?: Yes (appropriate for developmental age) Does the patient have difficulty walking or climbing stairs?: No Weakness of Legs: None Weakness of Arms/Hands: None  Home Assistive Devices/Equipment Home Assistive Devices/Equipment: None    Abuse/Neglect Assessment (Assessment to be complete while patient is alone) Abuse/Neglect Assessment Can Be Completed: Yes Physical Abuse: Denies Verbal Abuse: Denies Sexual Abuse: Denies Exploitation of patient/patient's resources: Denies Self-Neglect: Denies     Merchant navy officer (For Healthcare) Does Patient Have a Medical Advance Directive?: No          Disposition:  Disposition Initial Assessment Completed for this Encounter: Yes Patient referred to: Other (Comment) (Pt on IVC.  To be reviewed.)  This service was provided via telemedicine using a 2-way, interactive audio and video technology.  Names of all persons participating in this telemedicine service and their role in this encounter. Name: Tico Crotteau Role: patient  Name: Kevin Fenton Role: mother  Name: Beatriz Stallion, M.S. LCAS QP Role: clinician  Name:  Role:     Alexandria Lodge 02/26/2020 12:01 AM

## 2020-02-26 NOTE — ED Notes (Signed)
Pt to room 31 . Pt oriented to unit and room. Pt cooperative. No s/s of distress. Pt quiet and guarded.

## 2020-02-26 NOTE — ED Notes (Signed)
2 patient belongings bags given to [facility] officers as well as IVC paperwork. Patient to be transferred to [facility] at this time.

## 2020-02-27 ENCOUNTER — Encounter (HOSPITAL_COMMUNITY): Payer: Self-pay | Admitting: Psychiatry

## 2020-02-27 ENCOUNTER — Inpatient Hospital Stay (HOSPITAL_COMMUNITY)
Admission: RE | Admit: 2020-02-27 | Discharge: 2020-03-04 | DRG: 885 | Disposition: A | Discharge status: Home / Self Care | Payer: Federal, State, Local not specified - PPO | Source: Intra-hospital | Attending: Psychiatry | Admitting: Psychiatry

## 2020-02-27 ENCOUNTER — Other Ambulatory Visit: Payer: Self-pay

## 2020-02-27 DIAGNOSIS — F12959 Cannabis use, unspecified with psychotic disorder, unspecified: Secondary | ICD-10-CM | POA: Diagnosis not present

## 2020-02-27 DIAGNOSIS — F203 Undifferentiated schizophrenia: Secondary | ICD-10-CM

## 2020-02-27 DIAGNOSIS — F17210 Nicotine dependence, cigarettes, uncomplicated: Secondary | ICD-10-CM | POA: Diagnosis present

## 2020-02-27 DIAGNOSIS — F4325 Adjustment disorder with mixed disturbance of emotions and conduct: Secondary | ICD-10-CM | POA: Diagnosis not present

## 2020-02-27 DIAGNOSIS — F200 Paranoid schizophrenia: Secondary | ICD-10-CM | POA: Clinically undetermined

## 2020-02-27 DIAGNOSIS — F2081 Schizophreniform disorder: Secondary | ICD-10-CM

## 2020-02-27 DIAGNOSIS — F12951 Cannabis use, unspecified with psychotic disorder with hallucinations: Secondary | ICD-10-CM | POA: Diagnosis not present

## 2020-02-27 DIAGNOSIS — Z20822 Contact with and (suspected) exposure to covid-19: Secondary | ICD-10-CM | POA: Diagnosis present

## 2020-02-27 DIAGNOSIS — F23 Brief psychotic disorder: Secondary | ICD-10-CM | POA: Diagnosis present

## 2020-02-27 DIAGNOSIS — Z818 Family history of other mental and behavioral disorders: Secondary | ICD-10-CM

## 2020-02-27 DIAGNOSIS — D509 Iron deficiency anemia, unspecified: Secondary | ICD-10-CM | POA: Diagnosis present

## 2020-02-27 DIAGNOSIS — F29 Unspecified psychosis not due to a substance or known physiological condition: Principal | ICD-10-CM

## 2020-02-27 DIAGNOSIS — F1210 Cannabis abuse, uncomplicated: Secondary | ICD-10-CM

## 2020-02-27 DIAGNOSIS — F1290 Cannabis use, unspecified, uncomplicated: Secondary | ICD-10-CM

## 2020-02-27 DIAGNOSIS — W3400XA Accidental discharge from unspecified firearms or gun, initial encounter: Secondary | ICD-10-CM

## 2020-02-27 MED ORDER — ALUM & MAG HYDROXIDE-SIMETH 200-200-20 MG/5ML PO SUSP: 30.0000 mL | ORAL | Status: DC | PRN

## 2020-02-27 MED ORDER — TRAZODONE HCL 50 MG PO TABS
50.0000 mg | ORAL_TABLET | Freq: Every evening | ORAL | Status: DC | PRN
  Filled 2020-02-27 (×2): qty 1

## 2020-02-27 MED ORDER — MAGNESIUM HYDROXIDE 400 MG/5ML PO SUSP: 30.0000 mL | Freq: Every day | ORAL | Status: DC | PRN

## 2020-02-27 MED ORDER — ACETAMINOPHEN 325 MG PO TABS: 650.0000 mg | ORAL_TABLET | Freq: Four times a day (QID) | ORAL | Status: DC | PRN

## 2020-02-27 MED ORDER — LORAZEPAM 1 MG PO TABS: 1.0000 mg | ORAL_TABLET | ORAL | Status: DC | PRN

## 2020-02-27 MED ORDER — OLANZAPINE 5 MG PO TABS
5.0000 mg | ORAL_TABLET | Freq: Two times a day (BID) | ORAL | Status: DC
  Filled 2020-02-27 (×4): qty 1

## 2020-02-27 MED ORDER — OLANZAPINE 5 MG PO TBDP
5.0000 mg | ORAL_TABLET | Freq: Three times a day (TID) | ORAL | Status: DC | PRN
Start: 2020-02-27 — End: 2020-03-04

## 2020-02-27 MED ORDER — DIVALPROEX SODIUM 500 MG PO DR TAB
500.0000 mg | DELAYED_RELEASE_TABLET | Freq: Two times a day (BID) | ORAL | Status: DC
  Filled 2020-02-27 (×4): qty 1

## 2020-02-27 MED ORDER — ZIPRASIDONE MESYLATE 20 MG IM SOLR: 20.0000 mg | INTRAMUSCULAR | Status: DC | PRN

## 2020-02-27 MED ORDER — BENZTROPINE MESYLATE 1 MG PO TABS
1.0000 mg | ORAL_TABLET | Freq: Two times a day (BID) | ORAL | Status: DC
  Filled 2020-02-27 (×4): qty 1

## 2020-02-27 MED ORDER — HYDROXYZINE HCL 25 MG PO TABS
25.0000 mg | ORAL_TABLET | Freq: Three times a day (TID) | ORAL | Status: DC | PRN
  Filled 2020-02-27 (×2): qty 1

## 2020-02-27 NOTE — Progress Notes (Signed)
Patient ID: Garrett Bolton, male   DOB: 2000-08-08, 19 y.o.   MRN: [id number]  Admission Note:  D:18 yr male who presents IVC in no acute distress for the treatment of Psychosis and aggression.  Pt is poor historian, pt changes his answers multiple times or states "I don't know". Pt on probation "for breaking and entering, I think". Pt continued to state"I don't take medicine, you can't make me take it, I have a right to refuse" pt was informed of the IVC process , but pt does not appear to understand after explanations numerous times. Pt had abrasions on Knuckles of R-hand from punching a wall, but pt would not go into detail. Pt very paranoid on the unit. Pt stated he was allergic to Depakote, Risperdal, and Benztropine. (his current medications) pt was asked what happens when he takes these medications and pt was very vague, then he stated "My body locks up and I have seizures". Pt was informed to discuss this with the doctor because these medications were on his home medication list.   Per Assessment:  Patient is a 19 year old man who presents today under IVC for evaluation. He has reportedly been emptying to fight his family members and making bizarre statements such as that they spit in his mouth while he was asleep to give him herpes. On my exam he appears delusional and paranoid. He inappropriately laughs at times and his answers are delayed and tangential.  Patient denies any SI, HI or A/V hallucinations.  He does say that he came to [facility] to be tested for herpes.  He believes that he may have contracted it because of a hair on his cigarette.   According to IVC papers he has been accusing sister and her boyfriend of trying to give him herpes by spitting in his mouth.   Clinician talked to mother, Garrett Bolton who said that patient last week had asked her "what can I do to end my soul?"  He also had told her "you'll know when I am gone."  She said that sister told her that patient  threatened to kill her boyfriend today.  Patient has not been eating, he is not on any medication and has not been sleeping well.  Mother said that patient has torn things up and broken a window in sister's home.  Patient has also attempted to get them to fight him also.  Patient has, according to mother, been inpatient at [facility] about a month ago. Patient has been at [facility].   A:Skin was assessed and found to be clear of any abnormal marks apart from abrasions on Knuckles R-hand , multiple tattoos. PT searched and no contraband found, POC and unit policies explained and understanding verbalized. Consents obtained. Food and fluids offered, and  Accepted.  R:Pt had no additional questions or concerns.

## 2020-02-27 NOTE — BHH Counselor (Signed)
Adult Comprehensive Assessment  Patient ID: Garrett Bolton, male   DOB: 2001-02-28, 19 y.o.   MRN: [id number]  Information Source: Information source: Patient  Current Stressors:  Patient states their primary concerns and needs for treatment are:: "They didn't tell me, just picked me up from my room and brought me here" Patient states their goals for this hospitilization and ongoing recovery are:: "To go home" Educational / Learning stressors: Recently graduated from high school  Employment / Job issues: Yes, reports having some stress from his job. Currently works at [employer] Relationships: "Sometimes with the ones I used to live withEngineer, petroleum / Lack of resources (include bankruptcy): Denies stressor Housing / Lack of housing: "A little bit" Physical health (include injuries & life threatening diseases): "None" Social relationships: "No" Substance abuse: "No" Bereavement / Loss: Denies stressor  Living/Environment/Situation:  Living Arrangements: Other relatives Living conditions (as described by patient or guardian): "It's ok" Who else lives in the home?: Sister  and her boyfriend. How long has patient lived in current situation?: 2 years What is atmosphere in current home: Temporary  Family History:  Marital status: Single Are you sexually active?: No What is your sexual orientation?: "Females" Does patient have children?: No  Childhood History:  By whom was/is the patient raised?: Mother, Mother/father and step-parent, Sibling, Grandparents Additional childhood history information: Father sporadic involvement. Grandmother was involved in upbringing. Lived with mother until last year when moving in with sister. Description of patient's relationship with caregiver when they were a child: "It was good, always loving" Patient's description of current relationship with people who raised him/her: "Still the same, just a little distant...they didn't like the stuff I  was doing there, like smoking and stuff, so I got kicked out and went to sisters" How were you disciplined when you got in trouble as a child/adolescent?: "Whooped...regular, nothing too crazy, nothing abusive" Does patient have siblings?: Yes Number of Siblings: 1 Description of patient's current relationship with siblings: "It's loving" Did patient suffer any verbal/emotional/physical/sexual abuse as a child?: No Did patient suffer from severe childhood neglect?: No Has patient ever been sexually abused/assaulted/raped as an adolescent or adult?: No Was the patient ever a victim of a crime or a disaster?: No Witnessed domestic violence?: No Has patient been effected by domestic violence as an adult?: No  Education:  Highest grade of school patient has completed: Graduated high school  Currently a student?: No Learning disability?: No  Employment/Work Situation:   Employment situation: Employed 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 Are There Guns or Other Weapons in Your Home?: No  Financial Resources:   Financial resources: Income from employment, Private insurance Does patient have a representative payee or guardian?: No   Alcohol/Substance Abuse:   What has been your use of drugs/alcohol within the last 12 months?: Patient states he stopped smoking THC 3-4 weeks ago. Patient denies all other substance and alcohol use Alcohol/Substance Abuse Treatment Hx: Denies past history Has alcohol/substance abuse ever caused legal problems?: No  Social Support System:   Patient's Community Support  System: Fair Development worker, community Support System: Patient states "I got  to find my own support system" Type of faith/religion: Ephriam Knuckles How does patient's faith help to cope with current illness?: "Talk to him"  Leisure/Recreation:   Leisure and Hobbies: Psychologist, occupational, reading, games"  Strengths/Needs:   What is the patient's perception of their strengths?: "Communicating, listening" Patient states they can use these personal strengths during their treatment to contribute to their recovery: "With me being honest and telling it how it is, they could help me better to deal with what's going on" Patient states these barriers may affect/interfere with their treatment: "No" Patient states these barriers may affect their return to the community: ""No"  Discharge Plan:   Currently receiving community mental health services: No Patient states concerns and preferences for aftercare planning are: Patient declined all follow up Patient states they will know when they are safe and ready for discharge when: "Being here stresses me out. Would like to leave today, however stated may benefit from another day or two" Does patient have access to transportation?: Yes (sister) Does patient have financial barriers related to discharge medications?: No Patient description of barriers related to discharge medications: n/a Will patient be returning to same living situation after discharge?: Yes  Summary/Recommendations:   Summary and Recommendations (to be completed by the evaluator):19 year old male presenting with psychosis and agitated behavior. Patient states "it's been a lot of mindstuff going on." Patient states "it's not safe for me in here either, they're doing the same thing." Patient presents with apparent paranoid delusions. Patient reports at home he believes there is someone who is attempting to give him herpes, patient believes this is also happening while in the emergency department. Patient reports  he would like to see a physician right away regarding "herpes on my tongue." Patient will benefit from crisis stabilization, medication evaluation, group therapy and psychoeducation, in addition to case management for discharge planning. At discharge it is recommended that Patient adhere to the established discharge plan and continue in treatment.

## 2020-02-27 NOTE — Progress Notes (Signed)
Recreation Therapy Notes  12.10.21 1110:  LRT introduced self to patient and explained the recreation therapy assessment to him.  Pt declined to complete assessment.  LRT informed pt she would follow up with him later to complete assessment.  Pt was receptive to that.    Caroll Rancher, LRT/CTRS    Caroll Rancher A 02/27/2020 11:50 AM

## 2020-02-27 NOTE — Tx Team (Signed)
Initial Treatment Plan 02/27/2020 12:44 AM Jolly Mango HYI:[id number]    PATIENT STRESSORS: Marital or family conflict Medication change or noncompliance   PATIENT STRENGTHS: General fund of knowledge Motivation for treatment/growth   PATIENT IDENTIFIED PROBLEMS:  psychosis  "Anger, overall wellness"                   DISCHARGE CRITERIA:  Improved stabilization in mood, thinking, and/or behavior Verbal commitment to aftercare and medication compliance  PRELIMINARY DISCHARGE PLAN: Attend aftercare/continuing care group Outpatient therapy  PATIENT/FAMILY INVOLVEMENT: This treatment plan has been presented to and reviewed with the patient, Garrett Bolton.  The patient and family have been given the opportunity to ask questions and make suggestions.  Delos Haring, RN 02/27/2020, 12:44 AM

## 2020-02-27 NOTE — BHH Suicide Risk Assessment (Signed)
[facility] Admission Suicide Risk Assessment   Nursing information obtained from:  Patient Demographic factors:  Low socioeconomic status Current Mental Status:  NA Loss Factors:  Legal issues Historical Factors:  NA Risk Reduction Factors:  Positive social support  Total Time spent with patient: 20 minutes Principal Problem: Schizophrenia, paranoid (HCC) Diagnosis:  Principal Problem:   Schizophrenia, paranoid (HCC)  Subjective Data: 19 year old male presenting with psychosis and agitated behavior.  Continued Clinical Symptoms:  Alcohol Use Disorder Identification Test Final Score (AUDIT): 1 The "Alcohol Use Disorders Identification Test", Guidelines for Use in Primary Care, Second Edition.  World Science writer [facility]). Score between 0-7:  no or low risk or alcohol related problems. Score between 8-15:  moderate risk of alcohol related problems. Score between 16-19:  high risk of alcohol related problems. Score 20 or above:  warrants further diagnostic evaluation for alcohol dependence and treatment.   CLINICAL FACTORS:   Alcohol/Substance Abuse/Dependencies Currently Psychotic  See [facility] for Mental status exam    COGNITIVE FEATURES THAT CONTRIBUTE TO RISK:  Thought constriction (tunnel vision)    SUICIDE RISK:   Moderate:  Frequent suicidal ideation with limited intensity, and duration, some specificity in terms of plans, no associated intent, good self-control, limited dysphoria/symptomatology, some risk factors present, and identifiable protective factors, including available and accessible social support.  PLAN OF CARE: Continue to treat on inpatient basis  I certify that inpatient services furnished can reasonably be expected to improve the patient's condition.   Clement Sayres, MD 02/27/2020, 2:05 PM
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
## 2020-02-27 NOTE — Progress Notes (Signed)
Dar Note: Patient presents with a flat affect and depressed mood.  Patient is withdrawn and isolates to his room.  Denies suicidal thoughts, auditory and visual hallucinations.  Routine safety checks maintained.  Attended group but minimal.  Patient is safe on the unit.

## 2020-02-27 NOTE — Progress Notes (Signed)
Recreation Therapy Notes  Date: 12.10.21 Time: 1000 Location: 500 Hall Dayroom  Group Topic: Wellness  Goal Area(s) Addresses:  Patient will define components of whole wellness. Patient will verbalize benefit of whole wellness.  Intervention: Music  Activity: Exercise.  LRT led patients in a series of stretches to loosen up the body.  Patients were then given the opportunity to lead the group in exercises of their choosing.  The group was to get at least 30 minutes of exercise.  Patients could take breaks and get water as needed.  Education: Wellness, Building control surveyor.   Education Outcome: Acknowledges education/In group clarification offered/Needs additional education.   Clinical Observations/Feedback: Pt did not attend group session.   Caroll Rancher, LRT/CTRS         Caroll Rancher A 02/27/2020 11:39 AM

## 2020-02-27 NOTE — Tx Team (Signed)
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 acetaminophen (TYLENOL) tablet 650 mg  650 mg Oral Q6H PRN Rozetta Nunnery, NP      . alum & mag hydroxide-simeth (MAALOX/MYLANTA) 200-200-20 MG/5ML suspension 30 mL  30 mL Oral Q4H PRN Lindon Romp A, NP      . hydrOXYzine (ATARAX/VISTARIL) tablet 25 mg  25 mg Oral TID PRN Lindon Romp A, NP      . magnesium hydroxide (MILK OF MAGNESIA) suspension 30 mL  30 mL Oral Daily PRN Lindon Romp A, NP      . traZODone (DESYREL) tablet 50 mg  50 mg Oral QHS PRN Rozetta Nunnery, NP       PTA Medications: Medications Prior to Admission  Medication Sig Dispense Refill Last Dose  . acetaminophen (TYLENOL) 500 MG tablet Take 2 tablets (1,000 mg total) by mouth every 6 (six) hours as needed. (Patient not taking: Reported on 12/05/2019) 30 tablet 0   . benztropine (COGENTIN) 1 MG tablet Take 1 tablet (1 mg total) by mouth daily. (Patient not taking: Reported on 08/22/2019) 30 tablet 0   . divalproex (DEPAKOTE) 500 MG DR tablet Take 500 mg by mouth 2 (two) times daily. (Patient not taking: Reported on 02/25/2020)     . methocarbamol (ROBAXIN) 500 MG tablet Take 2 tablets (1,000 mg total) by mouth every 8 (eight) hours as needed for muscle spasms. (Patient not taking: Reported on 12/05/2019) 30 tablet 0   . omega-3 acid ethyl esters (LOVAZA) 1 g capsule Take 1 capsule (1 g total) by mouth 2 (two) times daily. (Patient not taking: Reported on 12/05/2019) 60 capsule 0   . oxyCODONE (OXY IR/ROXICODONE) 5 MG immediate release tablet Take 1 tablet (5 mg total) by mouth every 4 (four) hours as needed for moderate pain.  (Patient not taking: Reported on 12/05/2019) 20 tablet 0   . paliperidone (INVEGA SUSTENNA) 234 MG/1.5ML SUSY injection Inject 234 mg into the muscle once for 1 dose. Due approx 3/6 (Patient not taking: Reported on 08/22/2019) 1.5 mL 1   . polyethylene glycol (MIRALAX / GLYCOLAX) 17 g packet Take 17 g by mouth daily. (Patient not taking: Reported on 12/05/2019) 14 each 0   . risperiDONE (RISPERDAL) 2 MG tablet 3 at hs x 10 days the 2 at hs then on (Patient not taking: Reported on 08/22/2019) 70 tablet 0     Patient Stressors: Marital or family conflict Medication change or noncompliance  Patient Strengths: Technical sales engineer for treatment/growth  Treatment Modalities: Medication Management, Group therapy, Case management,  1 to 1 session with clinician, Psychoeducation, Recreational therapy.   Physician Treatment Plan for Primary Diagnosis: <principal problem not specified> Long Term Goal(s):     Short Term Goals:    Medication Management: Evaluate patient's response, side effects, and tolerance of medication regimen.  Therapeutic Interventions: 1 to 1 sessions, Unit Group sessions and Medication administration.  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  Therapeutic Interventions:  1 to 1 sessions, Unit Group sessions and Medication administration.  Evaluation of Outcomes: Not Met   RN Treatment Plan for Primary Diagnosis: <principal problem not specified> Long Term Goal(s): Knowledge of disease and therapeutic regimen to maintain health will improve  Short Term Goals: Ability to remain free from injury will improve, Ability to verbalize frustration and anger appropriately will improve, Ability to demonstrate  self-control, Ability to participate in decision making will improve, Ability to verbalize feelings will improve, Ability to identify and develop effective coping behaviors will improve and Compliance with prescribed medications will improve  Medication Management: RN will administer medications as ordered by provider, will assess and evaluate patient's response and provide education to patient for prescribed medication. RN will report any adverse and/or side effects to prescribing provider.  Therapeutic Interventions: 1 on 1 counseling sessions, Psychoeducation, Medication administration, Evaluate responses to treatment, Monitor vital signs and CBGs as ordered, Perform/monitor CIWA, COWS, AIMS and Fall Risk screenings as ordered, Perform wound care treatments as ordered.  Evaluation of Outcomes: Not Met   LCSW Treatment Plan for Primary Diagnosis: <principal problem not specified> Long Term Goal(s): Safe transition to appropriate next level of care at discharge, Engage patient in therapeutic group addressing interpersonal concerns.  Short Term Goals: Engage patient in aftercare planning with referrals and resources, Increase social support, Increase ability to appropriately verbalize feelings, Increase emotional regulation, Identify triggers associated with mental health/substance abuse issues and Increase skills for wellness and recovery  Therapeutic Interventions: Assess for all discharge needs, 1 to 1 time with Social worker, Explore available resources and support systems, Assess for adequacy in community support network, Educate family and significant other(s) on suicide prevention, Complete Psychosocial Assessment, Interpersonal group therapy.  Evaluation of Outcomes: Not Met   Progress in Treatment: Attending groups: No. and As evidenced by:  patient is new to this unit Participating in groups: No. Taking medication as prescribed: No. and As evidenced by:  No medications  ordered Toleration medication: No. and As evidenced by:  has not taken medication Family/Significant other contact made: No, will contact:  if consents are provided  Patient understands diagnosis: No. Discussing patient identified problems/goals with staff: Yes. Medical problems stabilized or resolved: No. Has wound on hand. Denies suicidal/homicidal ideation: Yes. Issues/concerns per patient self-inventory: No.   New problem(s) identified: No, Describe:  none  New Short Term/Long Term Goal(s): medication stabilization, elimination of SI thoughts, development of comprehensive mental wellness plan.   Patient Goals:  "To get out"  Discharge Plan or Barriers: Patient recently admitted. CSW will continue to follow and assess for appropriate referrals and possible discharge planning.   Reason for Continuation of Hospitalization: Aggression Delusions  Medication stabilization  Estimated Length of Stay: 3-5 days  Attendees: Patient: Garrett Bolton  02/27/2020  Physician: Lala Lund, MD  02/27/2020   Nursing:  02/27/2020   RN Care Manager: 02/27/2020   Social Worker: Darletta Moll, LCSW 02/27/2020   Recreational Therapist:  02/27/2020   Other:  02/27/2020   Other:  02/27/2020  Other: 02/27/2020    Scribe for Treatment Team: Vassie Moselle, LCSW 02/27/2020 9:45 AM

## 2020-02-27 NOTE — [facility] Suicide Risk Assessment (Addendum)
[facility] INPATIENT:  Family/Significant Other Suicide Prevention Education  Suicide Prevention Education:  Contact Attempts: Sister Ladamien Rammel [phone]) and [phone]), has been identified by the patient as the family member/significant other with whom the patient will be residing, and identified as the person(s) who will aid the patient in the event of a mental health crisis.  With written consent from the patient, two attempts were made to provide suicide prevention education, prior to and/or following the patient's discharge.  We were unsuccessful in providing suicide prevention education.  A suicide education pamphlet was given to the patient to share with family/significant other.   Date and time of first attempt: 02/27/20 CSW unable to leave voicemail with Roland Rack. Date and time of second attempt: 03/01/20 CSW left HIPAA compliant voicemail for this patients sister.    Ruthann Cancer MSW, LCSW Clincal Social Worker  [facility]

## 2020-02-27 NOTE — Progress Notes (Signed)
Recreation Therapy Notes  Patient admitted to unit 12.10.21. Due to admission within last year, no new recreation therapy assessment conducted at this time. Last assessment conducted 02.08.21. Patient reports no changes in stressors from previous admission.  Patient stated he did not know why he was here, he stated "they came and got me out of my room and brought me here".  Patient expressed coping skills were still journal, tv, sports, arguments, music, exercise, meditate, deep breathing, talk, art, prayer, avoidance and reading.  Patient stated leisure interests were still reading, writing and soccer.  Patient expressed strengths were still "ignoring negativity and making positive vibes out of the negative".  Areas of improvement were still not getting angry at little things.    Patient reports goal of "staying positive".  Patient denies SI, HI, AVH at this time.      Caroll Rancher, LRT/CTRS     Caroll Rancher A 02/27/2020 1:09 PM

## 2020-02-27 NOTE — [facility] Group Notes (Signed)
[facility] LCSW Group Therapy  02/27/2020 1:25 PM  Type of Therapy:  Psychoeducational Skills  Participation Level:  Minimal  Participation Quality:  Inattentive  Affect:  Flat  Cognitive:  Confused and Lacking  Insight:  Limited  Engagement in Therapy:  Limited  Modes of Intervention:  Discussion and Rapport Building  Summary of Progress/Problems: Patient came to group after group discussion had already begun. Patient only engaged in discussion if called upon and was inattentive to discussion.   Colin Norment A Tabbitha Janvrin 02/27/2020, 1:25 PM

## 2020-02-28 ENCOUNTER — Encounter (HOSPITAL_COMMUNITY): Payer: Self-pay | Admitting: Family

## 2020-02-28 DIAGNOSIS — F12951 Cannabis use, unspecified with psychotic disorder with hallucinations: Secondary | ICD-10-CM

## 2020-02-28 DIAGNOSIS — F4325 Adjustment disorder with mixed disturbance of emotions and conduct: Secondary | ICD-10-CM

## 2020-02-28 MED ORDER — HALOPERIDOL 5 MG PO TABS
5.0000 mg | ORAL_TABLET | Freq: Every day | ORAL | Status: DC
  Administered 2020-02-28 – 2020-02-29 (×2): 5 mg via ORAL
  Filled 2020-02-28 (×4): qty 1

## 2020-02-28 MED ORDER — HALOPERIDOL LACTATE 5 MG/ML IJ SOLN
10.0000 mg | Freq: Every day | INTRAMUSCULAR | Status: DC
  Filled 2020-02-28 (×3): qty 2

## 2020-02-28 MED ORDER — HALOPERIDOL 5 MG PO TABS
10.0000 mg | ORAL_TABLET | Freq: Every day | ORAL | Status: DC
  Administered 2020-02-29: 21:00:00 10 mg via ORAL
  Filled 2020-02-28 (×3): qty 2

## 2020-02-28 MED ORDER — HALOPERIDOL LACTATE 5 MG/ML IJ SOLN
5.0000 mg | Freq: Every day | INTRAMUSCULAR | Status: DC
  Filled 2020-02-28 (×4): qty 1

## 2020-02-28 MED ORDER — BENZTROPINE MESYLATE 1 MG PO TABS: 1.0000 mg | ORAL_TABLET | Freq: Two times a day (BID) | ORAL | Status: DC | PRN

## 2020-02-28 NOTE — Progress Notes (Signed)
Dar Note: Patient presents with blunted affect and mood.  Medication given with encouragement.  Denies suicidal thoughts, auditory and visual hallucinations.  Routine safety checks maintained.  Patient visible in milieu watching TV.  Patient is safe on the unit.  No interaction with staff or peers.
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
## 2020-02-28 NOTE — [facility] Group Notes (Signed)
.  Psychoeducational Group Note  Date: 02-28-20 Time: 0900-1000    Goal Setting   Purpose of Group: This group helps to provide patients with the steps of setting a goal that is specific, measurable, attainable, realistic and time specific. A discussion on how we keep ourselves stuck with negative self talk.    Participation Level:  Did not volunteer any information  Participation Quality:  Appropriate  Affect:  Appropriate  Cognitive:  Appropriate  Insight:  lacking  Engagement in Group:  Engaged  Additional Comments:  Rates his energy at a 6/10. Goal is to take his medication today.  Dione Housekeeper

## 2020-02-28 NOTE — Progress Notes (Signed)
[facility] Second Physician Opinion Progress Note for Medication Administration to Non-consenting Patients (For Involuntarily Committed Patients)  Patient: Roddrick Sharron Date of Birth: [birth year] MRN: [id number]  Reason for the Medication: The patient, without the benefit of the specific treatment measure, is incapable of participating in any available treatment plan that will give the patient a realistic opportunity of improving the patient's condition.  Consideration of Side Effects: Consideration of the side effects related to the medication plan has been given.  Rationale for Medication Administration: Patient has remained psychotic with delusions.  He believes he does not need medication, however per record review in past psychiatric hospitalizations with similar presentations, he has improved with medications.  Patient is guarded with slow response, but able to verbalize basic understanding of forced medication orders being placed.  He stated that he did not want shots, and "would take a pill, but probably spit it out." Nurses have been instructed to ensure that patient does not cheek medication.  Forced medication order for Haldol 5 mg QAM and 10 mg QPM  PO or IM    Mariel Craft, MD 02/28/20  10:10 AM   This documentation is good for (7) seven days from the date of the MD signature. New documentation must be completed every seven (7) days with detailed justification in the medical record if the patient requires continued non-emergent administration of psychotropic medications.

## 2020-02-28 NOTE — [facility] Group Notes (Signed)
  [facility] LCSW Group Therapy Note  Date/Time:  02/28/2020 11:15AM-12:00PM  Type of Therapy and Topic:  Group Therapy:  Feelings About Hospitalization  Participation Level:  Minimal   Description of Group This process group involved patients discussing their feelings related to being hospitalized, as well as the benefits they see to being in the hospital.  These feelings and benefits were itemized.  The group then brainstormed specific ways in which they could seek those same benefits when they discharge and return home.  Therapeutic Goals 1. Patient will identify and describe positive and negative feelings related to hospitalization 2. Patient will verbalize benefits of hospitalization to themselves personally 3. Patients will brainstorm together ways they can obtain similar benefits in the outpatient setting, identify barriers to wellness and possible solutions  Summary of Patient Progress:  The patient was not present until shortly before the end of group.  He expressed his primary feelings about being hospitalized are "I don't belong here" and said in order to stay well and out of the hospital he needs to return to the task that he was pursuing prior to admission, that of finding a room to stay in.  He stated one thing he will do to stay well and out of the hospital is to work, and he does not have a preference for the kind of work he will do, states he just needs something that makes money.  He shared that he does have supports in his life.  Therapeutic Modalities Cognitive Behavioral Therapy Motivational Interviewing    Ambrose Mantle, LCSW 02/28/2020, 12:03 PM

## 2020-02-28 NOTE — Progress Notes (Signed)
   02/27/20 2300  COVID-19 Daily Checkoff  If you have had runny nose, nasal congestion, sneezing in the past 24 hours, has it worsened? No  COVID-19 EXPOSURE  Have you traveled outside the state in the past 14 days? No  Have you been in contact with someone with a confirmed diagnosis of COVID-19 or PUI in the past 14 days without wearing appropriate PPE? No  Have you been living in the same home as a person with confirmed diagnosis of COVID-19 or a PUI (household contact)? No  Have you been diagnosed with COVID-19? No

## 2020-02-28 NOTE — Progress Notes (Signed)
Adult Psychoeducational Group Note  Date:  02/28/2020 Time:  8:46 PM  Group Topic/Focus:  Wrap-Up Group:   The focus of this group is to help patients review their daily goal of treatment and discuss progress on daily workbooks.  Participation Level:  Did Not Attend  Participation Quality:  Did Not Atttend  Affect:  Did Not Atttend  Cognitive:  Did Not Atttend  Insight: None  Engagement in Group:  Did Not Atttend  Modes of Intervention:  Did Not Atttend  Additional Comments:  Pt did not attend evening wrap up group tonight.  Felipa Furnace 02/28/2020, 8:46 PM

## 2020-02-29 DIAGNOSIS — F1210 Cannabis abuse, uncomplicated: Secondary | ICD-10-CM

## 2020-02-29 DIAGNOSIS — F1290 Cannabis use, unspecified, uncomplicated: Secondary | ICD-10-CM

## 2020-02-29 LAB — CBC WITH DIFFERENTIAL/PLATELET
Abs Immature Granulocytes: 0.03 10*3/uL (ref 0.00–0.07)
Basophils Absolute: 0.1 10*3/uL (ref 0.0–0.1)
Basophils Relative: 1 %
Eosinophils Absolute: 0.3 10*3/uL (ref 0.0–0.5)
Eosinophils Relative: 3 %
HCT: 44.4 % (ref 39.0–52.0)
Hemoglobin: 13.6 g/dL (ref 13.0–17.0)
Immature Granulocytes: 0 %
Lymphocytes Relative: 35 %
Lymphs Abs: 3.5 10*3/uL (ref 0.7–4.0)
MCH: 23.2 pg — ABNORMAL LOW (ref 26.0–34.0)
MCHC: 30.6 g/dL (ref 30.0–36.0)
MCV: 75.9 fL — ABNORMAL LOW (ref 80.0–100.0)
Monocytes Absolute: 0.6 10*3/uL (ref 0.1–1.0)
Monocytes Relative: 6 %
Neutro Abs: 5.5 10*3/uL (ref 1.7–7.7)
Neutrophils Relative %: 55 %
Platelets: 304 10*3/uL (ref 150–400)
RBC: 5.85 MIL/uL — ABNORMAL HIGH (ref 4.22–5.81)
RDW: 12.3 % (ref 11.5–15.5)
WBC: 9.9 10*3/uL (ref 4.0–10.5)
nRBC: 0 % (ref 0.0–0.2)

## 2020-02-29 NOTE — Consult Note (Signed)
Old EKG finding of high voltage and repolarization abnormality. Patient without chest pain or shortness of breath. Continue medical treatment. F/U OP as needed. Awaiting today's EKG faxed over. Discussed care with Dr. Carlota Raspberry, MD 02/29/2020 at 4:19 PM

## 2020-02-29 NOTE — Progress Notes (Signed)
Adult Psychoeducational Group Note  Date:  02/29/2020 Time:  8:37 PM  Group Topic/Focus:  Wrap-Up Group:   The focus of this group is to help patients review their daily goal of treatment and discuss progress on daily workbooks.  Participation Level:  Did Not Attend  Participation Quality:  Did Not Attend  Affect:  Did Not Attend  Cognitive:  Did Not Attend  Insight: None  Engagement in Group:  Did Not Attend  Modes of Intervention:  Did Not Attend  Additional Comments:  Pt did not attend evening wrap up group tonight.  Felipa Furnace 02/29/2020, 8:37 PM

## 2020-02-29 NOTE — Progress Notes (Signed)
Patient has been in bed asleep since shift change. No distress noted, safety maintained with 15 min checks.

## 2020-02-29 NOTE — Progress Notes (Signed)
   02/28/20 2300  COVID-19 Daily Checkoff  Have you had a fever (temp > 37.80C/100F)  in the past 24 hours?  No  If you have had runny nose, nasal congestion, sneezing in the past 24 hours, has it worsened? No  COVID-19 EXPOSURE  Have you traveled outside the state in the past 14 days? No  Have you been in contact with someone with a confirmed diagnosis of COVID-19 or PUI in the past 14 days without wearing appropriate PPE? No  Have you been living in the same home as a person with confirmed diagnosis of COVID-19 or a PUI (household contact)? No  Have you been diagnosed with COVID-19? No

## 2020-02-29 NOTE — Progress Notes (Signed)
Did not attend group 
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
## 2020-02-29 NOTE — Progress Notes (Signed)
D: Patient presents with pleasant affect. Patient still verbalizing some disorganized thoughts upon assessment and interaction but patient is compliant with medications at this time. Patient denies SI/HI at this time. Patient also denies AH/VH at this time. Patient contracts for safety.  A: Provided positive reinforcement and encouragement.  R: Patient cooperative and receptive efforts. Patient remains safe on the unit.   02/29/20 2046  Psych Admission Type (Psych Patients Only)  Admission Status Involuntary  Psychosocial Assessment  Patient Complaints None  Eye Contact Fair  Facial Expression Fixed smile  Affect Appropriate to circumstance  Speech Logical/coherent  Interaction Cautious  Motor Activity Fidgety  Appearance/Hygiene Improved  Behavior Characteristics Cooperative;Appropriate to situation  Mood Pleasant  Thought Process  Coherency Disorganized  Content Preoccupation  Delusions None reported or observed  Perception Derealization  Hallucination None reported or observed  Judgment Poor  Confusion Mild  Danger to Self  Current suicidal ideation? Denies  Danger to Others  Danger to Others None reported or observed

## 2020-02-29 NOTE — [facility] Group Notes (Signed)
[facility] LCSW Group Therapy Note  Date/Time:  02/29/2020  11:00AM-12:00PM  Type of Therapy and Topic:  Group Therapy:  Music and Mood  Participation Level:  Minimal   Description of Group: In this process group, members listened to a variety of genres of music and identified that different types of music evoke different responses.  Patients were encouraged to identify music that was soothing for them and music that was energizing for them.  Patients discussed how this knowledge can help with wellness and recovery in various ways including managing depression and anxiety as well as encouraging healthy sleep habits.    Therapeutic Goals: Patients will explore the impact of different varieties of music on mood Patients will verbalize the thoughts they have when listening to different types of music Patients will identify music that is soothing to them as well as music that is energizing to them Patients will discuss how to use this knowledge to assist in maintaining wellness and recovery Patients will explore the use of music as a coping skill  Summary of Patient Progress:  At the beginning of group, patient expressed that he felt "boxed in" but happy.  After a couple of songs, he left the group explaining that he has started on new medicines that were making him feel bad.  He did not return.  Therapeutic Modalities: Solution Focused Brief Therapy Activity   Ambrose Mantle, LCSW

## 2020-03-01 DIAGNOSIS — F12959 Cannabis use, unspecified with psychotic disorder, unspecified: Secondary | ICD-10-CM

## 2020-03-01 MED ORDER — RISPERIDONE 2 MG PO TBDP
2.0000 mg | ORAL_TABLET | Freq: Every day | ORAL | Status: DC
  Administered 2020-03-01: 23:00:00 2 mg via ORAL
  Filled 2020-03-01 (×2): qty 1

## 2020-03-01 MED ORDER — RISPERIDONE 1 MG PO TBDP
1.0000 mg | ORAL_TABLET | Freq: Every day | ORAL | Status: DC
  Administered 2020-03-01: 10:00:00 1 mg via ORAL
  Filled 2020-03-01 (×5): qty 1

## 2020-03-01 MED ORDER — BENZTROPINE MESYLATE 1 MG/ML IJ SOLN
INTRAMUSCULAR | Status: AC
  Filled 2020-03-01: qty 2

## 2020-03-01 MED ORDER — OLANZAPINE 10 MG IM SOLR: 5.0000 mg | Freq: Every day | INTRAMUSCULAR | Status: DC | PRN

## 2020-03-01 MED ORDER — BENZTROPINE MESYLATE 1 MG/ML IJ SOLN
1.0000 mg | Freq: Once | INTRAMUSCULAR | Status: DC
  Administered 2020-03-01: 07:00:00 1 mg via INTRAMUSCULAR
  Filled 2020-03-01: qty 1
  Filled 2020-03-01: qty 2

## 2020-03-01 MED ORDER — BENZTROPINE MESYLATE 1 MG/ML IJ SOLN
1.0000 mg | Freq: Once | INTRAMUSCULAR | Status: AC
  Administered 2020-03-01: 07:00:00 1 mg via INTRAMUSCULAR
  Filled 2020-03-01: qty 1

## 2020-03-01 MED ORDER — OLANZAPINE 10 MG IM SOLR: 10.0000 mg | Freq: Every evening | INTRAMUSCULAR | Status: DC | PRN

## 2020-03-01 NOTE — Progress Notes (Signed)
   03/01/20 0716  Vital Signs  Temp 98.4 F (36.9 C)  Temp Source Oral  Pulse Rate 82  Pulse Rate Source Monitor  Resp 18  BP 130/63  BP Location Right Arm  BP Method Automatic  Patient Position (if appropriate) Sitting  Oxygen Therapy  SpO2 100 %   D: Patient denies SI/HI/AVH. Pt. Stated that he felt a little "boxed in" because of his arms, legs, and jaw locking up. Pt. Rated depression 2/10. Pt. Out in open areas and attended group. A:  Patient took scheduled medicine.  Support and encouragement provided Routine safety checks conducted every 15 minutes. Patient  Informed to notify staff with any concerns.  R:  Safety maintained.

## 2020-03-01 NOTE — Progress Notes (Signed)
Nira Conn NP contacted again for an additional 1 mg dose of cogentin due to severe EPS in neck, arms and legs.

## 2020-03-01 NOTE — Progress Notes (Signed)
[facility] MD Progress Note  03/01/2020 12:24 PM Garrett Bolton  MRN:  [id number] Subjective:  This AM patient was noted to have "started experiencing EPS in the arms and the legs. Jerky movements of the neck and face along with protrusion of the tongue and locking of jaw and extremities"  On the way to breakfast this AM. Patient received 2 mg Cogentin. Patient responded well and was reported that he does not like haldol for this reason. On interview this AM patient reports that he feels "trapped." Patient reports that he does not feel that he needs medication and that his behavior was due to his THC ingestion. Patient reports that he does not want to take medication and he wants to leave to "get back to life." Patient reports that he is not angry just feeling "trapped" and he is willing to listen to staff on the unit. Patient reports that his appetite and sleep were ok. Patient contracts for safety and did not endorse AVH.  Principal Problem: Cannabis-induced psychotic disorder (HCC) Diagnosis: Principal Problem:   Cannabis-induced psychotic disorder (HCC) Active Problems:   Episodic cannabis use  Total Time spent with patient: 20 minutes  Past Psychiatric History: See H&P  Past Medical History:  Past Medical History:  Diagnosis Date  . Anemia   . Sickle cell trait [facility])     Past Surgical History:  Procedure Laterality Date  . CIRCUMCISION  2001  . LAPAROTOMY N/A 08/23/2019   Procedure: EXPLORATORY LAPAROTOMY;  Surgeon: Violeta Gelinas, MD;  Location: [facility] OR;  Service: General;  Laterality: N/A;   Family History: History reviewed. No pertinent family history. Family Psychiatric  History: See H&P Social History:  Social History   Substance and Sexual Activity  Alcohol Use Yes     Social History   Substance and Sexual Activity  Drug Use Yes  . Types: Methamphetamines, Marijuana   Comment: Daily    Social History   Socioeconomic History  . Marital status: Single    Spouse  name: Not on file  . Number of children: Not on file  . Years of education: Not on file  . Highest education level: Not on file  Occupational History  . Not on file  Tobacco Use  . Smoking status: Current Some Day Smoker    Packs/day: 0.25    Years: 4.00    Pack years: 1.00    Types: Cigars  . Smokeless tobacco: Never Used  Vaping Use  . Vaping Use: Never used  Substance and Sexual Activity  . Alcohol use: Yes  . Drug use: Yes    Types: Methamphetamines, Marijuana    Comment: Daily  . Sexual activity: Never  Other Topics Concern  . Not on file  Social History Narrative  . Not on file   Social Determinants of Health   Financial Resource Strain: Not on file  Food Insecurity: Not on file  Transportation Needs: Not on file  Physical Activity: Not on file  Stress: Not on file  Social Connections: Not on file   Additional Social History:                         Sleep: Fair  Appetite:  Fair  Current Medications: Current Facility-Administered Medications  Medication Dose Route Frequency Provider Last Rate Last Admin  . acetaminophen (TYLENOL) tablet 650 mg  650 mg Oral Q6H PRN Jackelyn Poling, NP      . alum & mag hydroxide-simeth (MAALOX/MYLANTA) 200-200-20 MG/5ML  suspension 30 mL  30 mL Oral Q4H PRN Nira Conn A, NP      . benztropine (COGENTIN) tablet 1 mg  1 mg Oral BID PRN Lauro [name], MD      . hydrOXYzine (ATARAX/VISTARIL) tablet 25 mg  25 mg Oral TID PRN Nira Conn A, NP      . OLANZapine zydis (ZYPREXA) disintegrating tablet 5 mg  5 mg Oral Q8H PRN Renaldo Fiddler Mardelle Matte, MD       And  . LORazepam (ATIVAN) tablet 1 mg  1 mg Oral PRN Lauro [name], MD       And  . ziprasidone (GEODON) injection 20 mg  20 mg Intramuscular PRN Lauro [name], MD      . magnesium hydroxide (MILK OF MAGNESIA) suspension 30 mL  30 mL Oral Daily PRN Nira Conn A, NP      . OLANZapine (ZYPREXA) injection 10 mg  10 mg Intramuscular QHS PRN Antonieta Pert, MD      . OLANZapine [facility]) injection 5 mg  5 mg Intramuscular Daily PRN Antonieta Pert, MD      . risperiDONE (RISPERDAL M-TABS) disintegrating tablet 1 mg  1 mg Oral Daily Antonieta Pert, MD   1 mg at 03/01/20 0955  . risperiDONE (RISPERDAL M-TABS) disintegrating tablet 2 mg  2 mg Oral QHS Antonieta Pert, MD      . traZODone (DESYREL) tablet 50 mg  50 mg Oral QHS PRN Jackelyn Poling, NP        Lab Results:  Results for orders placed or performed during the hospital encounter of 02/27/20 (from the past 48 hour(s))  CBC with Differential/Platelet     Status: Abnormal   Collection Time: 02/29/20  5:37 PM  Result Value Ref Range   WBC 9.9 4.0 - 10.5 K/uL   RBC 5.85 (H) 4.22 - 5.81 MIL/uL   Hemoglobin 13.6 13.0 - 17.0 g/dL   HCT 94.1 74.0 - 81.4 %   MCV 75.9 (L) 80.0 - 100.0 fL   MCH 23.2 (L) 26.0 - 34.0 pg   MCHC 30.6 30.0 - 36.0 g/dL   RDW 48.1 85.6 - 31.4 %   Platelets 304 150 - 400 K/uL   nRBC 0.0 0.0 - 0.2 %   Neutrophils Relative % 55 %   Neutro Abs 5.5 1.7 - 7.7 K/uL   Lymphocytes Relative 35 %   Lymphs Abs 3.5 0.7 - 4.0 K/uL   Monocytes Relative 6 %   Monocytes Absolute 0.6 0.1 - 1.0 K/uL   Eosinophils Relative 3 %   Eosinophils Absolute 0.3 0.0 - 0.5 K/uL   Basophils Relative 1 %   Basophils Absolute 0.1 0.0 - 0.1 K/uL   Immature Granulocytes 0 %   Abs Immature Granulocytes 0.03 0.00 - 0.07 K/uL    Comment: Performed at [facility], [address] [place], [place] [address]    Blood Alcohol level:  Lab Results  Component Value Date   ETH <10 02/25/2020   ETH <10 12/05/2019    Metabolic Disorder Labs: Lab Results  Component Value Date   HGBA1C 4.3 (L) 04/21/2019   MPG 76.71 04/21/2019   Lab Results  Component Value Date   PROLACTIN 15.2 04/21/2019   Lab Results  Component Value Date   CHOL 153 04/21/2019   TRIG 53 04/21/2019   HDL 52 04/21/2019   CHOLHDL 2.9 04/21/2019   VLDL 11 04/21/2019   LDLCALC 90  04/21/2019  Physical Findings: AIMS: Facial and Oral Movements Muscles of Facial Expression: None, normal Lips and Perioral Area: None, normal Jaw: None, normal Tongue: None, normal,Extremity Movements Upper (arms, wrists, hands, fingers): None, normal Lower (legs, knees, ankles, toes): None, normal, Trunk Movements Neck, shoulders, hips: None, normal, Overall Severity Severity of abnormal movements (highest score from questions above): None, normal Incapacitation due to abnormal movements: None, normal Patient's awareness of abnormal movements (rate only patient's report): No Awareness, Dental Status Current problems with teeth and/or dentures?: No Does patient usually wear dentures?: No  CIWA:  CIWA-Ar Total: 0 COWS:  COWS Total Score: 0  Musculoskeletal: Strength & Muscle Tone: within normal limits on interview after Cogentin this AM Gait & Station: normal Patient leans: N/A  Psychiatric Specialty Exam: Physical Exam HENT:     Head: Normocephalic and atraumatic.  Pulmonary:     Effort: Pulmonary effort is normal.  Neurological:     Mental Status: He is alert.     Review of Systems  Cardiovascular: Negative for chest pain.  Gastrointestinal: Negative for abdominal distention.  Neurological: Negative for headaches.    Blood pressure 130/63, pulse 82, temperature 98.4 F (36.9 C), temperature source Oral, resp. rate 18, height 5\' 10"  (1.778 m), weight 57.2 kg, SpO2 100 %.Body mass index is 18.08 kg/m.  General Appearance: Fairly Groomed  Eye Contact:  Good  Speech:  Clear and Coherent  Volume:  Normal  Mood:  "trapped"  Affect:  Flat  Thought Process:  Linear  Orientation:  NA  Thought Content:  Logical  Suicidal Thoughts:  No  Homicidal Thoughts:  No  Memory:  Recent;   Fair  Judgement:  Other:  but somewhat imrproved as patient is able to report that he does not think THC helps him and he reports at this time that he will stop smoking it  Insight:  Shallow   Psychomotor Activity:  Normal  Concentration:  Concentration: Fair  Recall:  NA  Fund of Knowledge:  Fair  Language:  Good  Akathisia:  No  Handed:  Right  AIMS (if indicated):     Assets:  Communication Skills Leisure Time Physical Health  ADL's:  Intact  Cognition:  WNL  Sleep:  Number of Hours: 5.75     Treatment Plan Summary: Daily contact with patient to assess and evaluate symptoms and progress in treatment  Mr. Stopa is a 18yo patient who was admitted for psychosis. Patient was noted to have EPS symptoms this AM and required Cogentin thus his haldol is being discontinued and patient will be transitioned to risperidal. Patient was on a high dose of haldol and EPS symptoms are also more likely in young African American males. Second generation antipsychotics may decrease his chance of developing EPS. Patient does not endorse symptoms of psychosis while on the medications.  While there is concern that the patient has substance induced psychosis there is also concern that patient may have unspecified schizophrenia spectrum disorder and patient THC use is only increasing his risk for schizophrenia. Patient did require significant doses of antipsychotics in the past to return to baseline further increasing concern for psychosis 2/2 organic disease. Patient CBC was similar to previous CBC's not concerning at this time.   Substance induced Psychosis vs Unspecified Schizophrenia Spectrum disorder - Risperidone 1mg  AM and 2mg  QHS, monitor for negative side effects -ContinueAgitation Protocol: Zyprexa/Ativan/Geodon PRN -ContinuePRN: Tylenol, Maalox, Atarax, Milk of Magnesia, Trazodone PGY-1 Darrold Span, MD 03/01/2020, 12:24 PM

## 2020-03-01 NOTE — [facility] Group Notes (Signed)
The focus of this group is to help patients establish daily goals to achieve during treatment and discuss how the patient can incorporate goal setting into their daily lives to aide in recovery.    Pt participated in group  

## 2020-03-01 NOTE — Progress Notes (Addendum)
Patient walking in hall with breakfast and started experiencing EPS in the arms and the legs. Jerky movements of the neck and face along with protrusion of the tongue and locking of jaw and extremities.  Patient reports that he does not like to take Haldol because this has been his experience with the medication and he does not like the way the medication makes him feel. Order for 1mg  IM Cogentin received from NP and administered symptoms remained unchanged. Order for repeat dose of 1mg  Cogentin IM given pt symptoms currently resolving and he reports feeling better. Dr. Nira Conn will be notified and [facility] notified. Report given to oncoming shift. VS taken and WNL. See VS flowsheet.

## 2020-03-01 NOTE — Progress Notes (Signed)
Adult Psychoeducational Group Note  Date:  03/01/2020 Time:  10:09 PM  Group Topic/Focus:  Wrap-Up Group:   The focus of this group is to help patients review their daily goal of treatment and discuss progress on daily workbooks.  Participation Level:  Did Not Attend  Participation Quality:  Did Not Attend  Affect:  Did Not Attend  Cognitive:  Did Not Attend  Insight: None  Engagement in Group:  Did Not Attend  Modes of Intervention:  Did Not Attend  Additional Comments:  Pt did not attend evening wrap up group tonight.  Felipa Furnace 03/01/2020, 10:09 PM

## 2020-03-01 NOTE — BHH Group Notes (Signed)
[facility] LCSW Group Therapy  03/01/2020 3:24 PM   The focus of this group was to explore self compassion, awareness, and ways to practice self-compassion in daily life.   Type of Therapy:  Group Therapy  Participation Level:  Active  Participation Quality:  Appropriate and Sharing  Affect:  Appropriate  Cognitive:  Appropriate  Insight:  Improving  Engagement in Therapy:  Engaged  Modes of Intervention:  Activity, Education and Exploration  Summary of Progress/Problems: Patient attended and participated in group. Patient was able to share and answer questions appropriately throughout group.   Paralee Pendergrass A Shauntavia Brackin 03/01/2020, 3:24 PM

## 2020-03-01 NOTE — Progress Notes (Signed)
Recreation Therapy Notes  Date: 12.13.21 Time: 1005 Location: 500 Hall Dayroom   Group Topic: Communication, Team Building, Problem Solving  Goal Area(s) Addresses:  Patient will effectively work with peer towards shared goal.  Patient will identify skills used to make activity successful.  Patient will share challenges and verbalize solution-driven approaches used. Patient will identify how skills used during activity can be used to reach post d/c goals.   Behavioral Response: Engaged  Intervention: STEM Activity   Activity: [employer]. Patients were provided the following materials: 2 drinking straws, 5 rubber bands, 5 paper clips, 2 index cards and 2 drinking cups. Using the provided materials patients were asked to build a launching mechanism to launch a ping pong ball across the room, approximately 10 feet. Patients were divided into teams of 3-5. Instructions required all materials be incorporated into the device, functionality of items left to the peer group's discretion.  Education: Pharmacist, community, Scientist, physiological, Air cabin crew, Building control surveyor.   Education Outcome: Acknowledges education/In group clarification offered/Needs additional education.   Clinical Observations/Feedback: Pt was active and worked well with peer in completing activity.  Pt stated it took teamwork and a strategy to complete the activity.  Pt also expressed his main problem was holding things in for long periods of time and then ending up "here".  LRT encouraged pt to write down his feelings so even if he doesn't feel comfortable talking about them in the moment, when he is ready to address them, he has them written and he's not walking around holding it in.  Pt was receptive to the advice.    Caroll Rancher, LRT/CTRS     Caroll Rancher A 03/01/2020 11:57 AM

## 2020-03-02 DIAGNOSIS — F203 Undifferentiated schizophrenia: Secondary | ICD-10-CM

## 2020-03-02 DIAGNOSIS — F2081 Schizophreniform disorder: Secondary | ICD-10-CM

## 2020-03-02 MED ORDER — RISPERIDONE 2 MG PO TBDP
2.0000 mg | ORAL_TABLET | Freq: Every day | ORAL | Status: DC
  Administered 2020-03-02 – 2020-03-04 (×2): 2 mg via ORAL
  Filled 2020-03-02 (×3): qty 1

## 2020-03-02 MED ORDER — RISPERIDONE 3 MG PO TBDP
3.0000 mg | ORAL_TABLET | Freq: Every day | ORAL | Status: DC
  Administered 2020-03-02: 21:00:00 3 mg via ORAL
  Filled 2020-03-02 (×2): qty 1

## 2020-03-02 MED ORDER — WHITE PETROLATUM EX OINT
TOPICAL_OINTMENT | CUTANEOUS | Status: AC
  Filled 2020-03-02: qty 5

## 2020-03-02 NOTE — Progress Notes (Signed)
Pt came to [facility] and apologized for throwing a banana at [employer]. Writer accepted apology and assisted pt with information than may be useful in the future . Pt appeared very appreciative. Pt continued to appear paranoid, but pt able to communicate appropriately with good behavior.

## 2020-03-02 NOTE — Progress Notes (Signed)
[facility]  03/02/2020 1:11 PM Garrett Bolton  MRN:  [id number] Subjective:  This AM patient reports that he is sleeping and eating well and that he wants to go home. Patient reports that his birthday is coming up and he wants to have time to prepare. Patient reports that he notices that people who want to stay get to leave and people who want to go have to stay, he reports that he may start acting like he wants to stay so he can go. Patient attended some groups yesterday and his favorite was music therapy. Patient reports that he likes to use music to help him with his emotions. Patient also reports that he was able to apologize to someone on the night shift for throwing a banana at them earlier in his stay. Patient continues to report that he does not feel that he needs to be on medication but if saying he will take his medications will help him get out faster he is willing to say this. SW attempted to get patient to try to sign up for an ACTT team but patient refused but did report that he would follow up with psychiatry virtually so not to intervene with his work schedule.   Patient is often seen standing in his doorway looking into space. Spoke with patient mother and sister. Patient mother provided sister's number Garrett Bolton sister [phone].Garrett Bolton reports that patient had been self-isolating and appeared depressed recently but she only felt that she needed to bring him to the hospital when he beagn hallucinating and talking to himself. Garrett Bolton reports that patient is often unwell when he is staring into space and "expresionless." Garrett Bolton reports that when patient is acting like this he more likely to be angry and erupt. At his baseline patient is able to joke around, prefers to interact with others, and has facial expressions. Principal Problem: Undifferentiated schizophrenia (HCC) Diagnosis: Principal Problem:   Undifferentiated schizophrenia (HCC) Active Problems:   Cannabis-induced  psychotic disorder (HCC)   Episodic cannabis use  Total Time spent with patient: 30 minutes  Past Psychiatric History: See H&P  Past Medical History:  Past Medical History:  Diagnosis Date   Anemia    Sickle cell trait (HCC)     Past Surgical History:  Procedure Laterality Date   CIRCUMCISION  2001   LAPAROTOMY N/A 08/23/2019   Procedure: EXPLORATORY LAPAROTOMY;  Surgeon: Violeta Gelinashompson, Burke, MD;  [facility];  Service: General;  Laterality: N/A;   Family History: History reviewed. No pertinent family history. Family Psychiatric  History: See H&P Social History:  Social History   Substance and Sexual Activity  Alcohol Use Yes     Social History   Substance and Sexual Activity  Drug Use Yes   Types: Methamphetamines, Marijuana   Comment: Daily    Social History   Socioeconomic History   Marital status: Single    Spouse name: Not on file   Number of children: Not on file   Years of education: Not on file   Highest education level: Not on file  Occupational History   Not on file  Tobacco Use   Smoking status: Current Some Day Smoker    Packs/day: 0.25    Years: 4.00    Pack years: 1.00    Types: Cigars   Smokeless tobacco: Never Used  Vaping Use   Vaping Use: Never used  Substance and Sexual Activity   Alcohol use: Yes   Drug use: Yes    Types: Methamphetamines,  Marijuana    Comment: Daily   Sexual activity: Never  Other Topics Concern   Not on file  Social History Narrative   Not on file   Social Determinants of Health   Financial Resource Strain: Not on file  Food Insecurity: Not on file  Transportation Needs: Not on file  Physical Activity: Not on file  Stress: Not on file  Social Connections: Not on file   Additional Social History:                         Sleep: Fair  Appetite:  Fair  Current Medications: Current Facility-Administered Medications  Medication Dose Route Frequency Provider Last Rate Last Admin    acetaminophen (TYLENOL) tablet 650 mg  650 mg Oral Q6H PRN Jackelyn Poling, NP       alum & mag hydroxide-simeth (MAALOX/MYLANTA) 200-200-20 MG/5ML suspension 30 mL  30 mL Oral Q4H PRN Nira Conn A, NP       benztropine (COGENTIN) tablet 1 mg  1 mg Oral BID PRN Lauro [name], MD       hydrOXYzine (ATARAX/VISTARIL) tablet 25 mg  25 mg Oral TID PRN Jackelyn Poling, NP       OLANZapine zydis (ZYPREXA) disintegrating tablet 5 mg  5 mg Oral Q8H PRN Lauro [name], MD       And   LORazepam (ATIVAN) tablet 1 mg  1 mg Oral PRN Lauro [name], MD       And   ziprasidone (GEODON) injection 20 mg  20 mg Intramuscular PRN Lauro [name], MD       magnesium hydroxide (MILK OF MAGNESIA) suspension 30 mL  30 mL Oral Daily PRN Jackelyn Poling, NP       OLANZapine (ZYPREXA) injection 10 mg  10 mg Intramuscular QHS PRN Antonieta Pert, MD       OLANZapine (ZYPREXA) injection 5 mg  5 mg Intramuscular Daily PRN Antonieta Pert, MD       risperiDONE (RISPERDAL M-TABS) disintegrating tablet 2 mg  2 mg Oral Daily Antonieta Pert, MD   2 mg at 03/02/20 0730   risperiDONE (RISPERDAL M-TABS) disintegrating tablet 3 mg  3 mg Oral QHS Antonieta Pert, MD       traZODone (DESYREL) tablet 50 mg  50 mg Oral QHS PRN Jackelyn Poling, NP        Lab Results:  Results for orders placed or performed during the hospital encounter of 02/27/20 (from the past 48 hour(s))  CBC with Differential/Platelet     Status: Abnormal   Collection Time: 02/29/20  5:37 PM  Result Value Ref Range   WBC 9.9 4.0 - 10.5 K/uL   RBC 5.85 (H) 4.22 - 5.81 MIL/uL   Hemoglobin 13.6 13.0 - 17.0 g/dL   HCT 51.7 00.1 - 74.9 %   MCV 75.9 (L) 80.0 - 100.0 fL   MCH 23.2 (L) 26.0 - 34.0 pg   MCHC 30.6 30.0 - 36.0 g/dL   RDW 44.9 67.5 - 91.6 %   Platelets 304 150 - 400 K/uL   nRBC 0.0 0.0 - 0.2 %   Neutrophils Relative % 55 %   Neutro Abs 5.5 1.7 - 7.7 K/uL   Lymphocytes Relative 35 %   Lymphs Abs  3.5 0.7 - 4.0 K/uL   Monocytes Relative 6 %   Monocytes Absolute 0.6 0.1 - 1.0 K/uL   Eosinophils Relative 3 %  Eosinophils Absolute 0.3 0.0 - 0.5 K/uL   Basophils Relative 1 %   Basophils Absolute 0.1 0.0 - 0.1 K/uL   Immature Granulocytes 0 %   Abs Immature Granulocytes 0.03 0.00 - 0.07 K/uL    Comment: Performed at [facility], [address] [place], [place] [address]    Blood Alcohol level:  Lab Results  Component Value Date   [facility] <10 02/25/2020   ETH <10 12/05/2019    Metabolic Disorder Labs: Lab Results  Component Value Date   HGBA1C 4.3 (L) 04/21/2019   MPG 76.71 04/21/2019   Lab Results  Component Value Date   PROLACTIN 15.2 04/21/2019   Lab Results  Component Value Date   CHOL 153 04/21/2019   TRIG 53 04/21/2019   HDL 52 04/21/2019   CHOLHDL 2.9 04/21/2019   VLDL 11 04/21/2019   LDLCALC 90 04/21/2019    Physical Findings: AIMS: Facial and Oral Movements Muscles of Facial Expression: None, normal Lips and Perioral Area: None, normal Jaw: None, normal Tongue: None, normal,Extremity Movements Upper (arms, wrists, hands, fingers): None, normal Lower (legs, knees, ankles, toes): None, normal, Trunk Movements Neck, shoulders, hips: None, normal, Overall Severity Severity of abnormal movements (highest score from questions above): None, normal Incapacitation due to abnormal movements: None, normal Patient's awareness of abnormal movements (rate only patient's report): No Awareness, Dental Status Current problems with teeth and/or dentures?: No Does patient usually wear dentures?: No  CIWA:  CIWA-Ar Total: 0 COWS:  COWS Total Score: 0  Musculoskeletal: Strength & Muscle Tone: within normal limits Gait & Station: normal Patient leans: N/A  Psychiatric Specialty Exam: Physical Exam Constitutional:      Appearance: Normal appearance.  HENT:     Head: Normocephalic and atraumatic.  Pulmonary:     Effort: Pulmonary effort is  normal.  Neurological:     Mental Status: He is alert.     Review of Systems  Cardiovascular: Negative for chest pain.  Gastrointestinal: Negative for abdominal pain.  Neurological: Negative for headaches.    Blood pressure 114/73, pulse 100, temperature (!) 97.3 F (36.3 C), temperature source Oral, resp. rate 18, height 5\' 10"  (1.778 m), weight 57.2 kg, SpO2 100 %.Body mass index is 18.08 kg/m.  General Appearance: Casual  Eye Contact:  Fair  Speech:  Clear and Coherent  Volume:  Decreased  Mood:  Anxious  Affect:  Flat  Thought Process:  Linear " I want to go home" Can you speak to my mom or sister?"  Orientation:  Full (Time, Place, and Person)  Thought Content:  Illogical and Rumination  Suicidal Thoughts:  No  Homicidal Thoughts:  No  Memory:  Recent;   Poor  Judgement:  Impaired  Insight:  Lacking  Psychomotor Activity:  Decreased  Concentration:  Concentration: Fair  Recall:  NA  Fund of Knowledge:  Fair  Language:  Fair  Akathisia:  No  Handed:  Right  AIMS (if indicated):     Assets:  Physical Health Social Support  ADL's:  Intact  Cognition:  WNL  Sleep:  Number of Hours: 8.25     Treatment Plan Summary: Daily contact with patient to assess and evaluate symptoms and progress in treatment  Mr. Soffer is a 18yo patient who was admitted for psychosis. There was concern that patient psychosis was induced by cannabis; however, after confirming with patient's sister that patient current behavior is still not his baseline and confirming that patient has behavior changes over a period of time  patient appears to be on the Schizophrenia Spectrum. Patient is not endorsing AVH at this time but does appear to be a bit paranoid as he is often seen standing in his door way and frequently asks staff questions about when he will get to leave. Patient continues to have flat affect with diminished emotional expression and very poor insight into his situation repeatedly  mentioning that he does not think he needs medication and will likely not continue when he leaves. However, per sister patient has a pattern of suddenly withdrawing, having poor hygiene, and later becoming irritable and hallucinating. Patient does appear to require medication as he is showing some improvement in that he does not endorse hallucinations. 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 Will increase his medications.  Unspecified Schizophrenia Spectrum disorder - Risperidone 2mg  AM and 3mg  QHS, monitor for negative side effects -ContinueAgitation Protocol: Zyprexa/Ativan/Geodon PRN -ContinuePRN: Tylenol, Maalox, Atarax, Milk of Magnesia, Trazodone - Patient sister will facetime him this evening to help determine if patient is near baseline PGY-1 , MD 03/02/2020, 1:11 PM

## 2020-03-02 NOTE — BHH Group Notes (Signed)
Adult Psychoeducational Group Note  Date:  03/02/2020 Time:  10:20 AM  Group Topic/Focus:  Goals Group:   The focus of this group is to help patients establish daily goals to achieve during treatment and discuss how the patient can incorporate goal setting into their daily lives to aide in recovery.  Participation Level:  Active  Participation Quality:  Attentive  Affect:  Flat  Cognitive:  Appropriate  Insight: Appropriate  Engagement in Group:  Engaged  Modes of Intervention:  Discussion  Additional Comments:  Pt had a goal of working on anger management.Pt will continue to lean on outpatient therapy and their own skills once discharged.  Garrett Bolton Garrett Bolton 03/02/2020, 10:20 AM

## 2020-03-02 NOTE — Progress Notes (Signed)
DAR NOTE: Pt present with flat affect and depressed mood in the unit. Pt has been isolating himself and has been bed most of the time. Pt denies physical pain, took all his meds as scheduled.  Pt's safety ensured with 15 minute and environmental checks. Pt currently denies SI/HI and A/V hallucinations. Pt verbally agrees to seek staff if SI/HI or A/VH occurs and to consult with staff before acting on these thoughts. Will continue POC.  
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
## 2020-03-03 ENCOUNTER — Other Ambulatory Visit (HOSPITAL_COMMUNITY): Payer: Self-pay | Admitting: Psychiatry

## 2020-03-03 DIAGNOSIS — F29 Unspecified psychosis not due to a substance or known physiological condition: Principal | ICD-10-CM

## 2020-03-03 MED ORDER — PALIPERIDONE PALMITATE ER 234 MG/1.5ML IM SUSY
234.0000 mg | PREFILLED_SYRINGE | Freq: Once | INTRAMUSCULAR | Status: DC
  Filled 2020-03-03: qty 1.5

## 2020-03-03 MED ORDER — PALIPERIDONE PALMITATE ER 234 MG/1.5ML IM SUSY
234.0000 mg | PREFILLED_SYRINGE | Freq: Once | INTRAMUSCULAR | Status: AC
  Administered 2020-03-04: 13:00:00 234 mg via INTRAMUSCULAR

## 2020-03-03 MED ORDER — RISPERIDONE 2 MG PO TBDP
4.0000 mg | ORAL_TABLET | Freq: Every day | ORAL | Status: DC
  Administered 2020-03-03: 21:00:00 4 mg via ORAL
  Filled 2020-03-03 (×3): qty 2

## 2020-03-03 MED FILL — INVEGA SUSTENNA 234 MG PREF: 234 | 30 days supply | Qty: 2 | Fill #0

## 2020-03-03 NOTE — Progress Notes (Signed)
   03/03/20 0603  Vital Signs  Pulse Rate 89  BP 128/84  BP Location Right Arm  BP Method Automatic  Patient Position (if appropriate) Standing   D: Patient denies SI/HI/AVH. Pt. Denies anxiety and depression. Pt. Was out in open areas. A:  Patient wants to take his Risperdal at night. Support and encouragement provided Routine safety checks conducted every 15 minutes. Patient  Informed to notify staff with any concerns.   R: Safety maintained.

## 2020-03-03 NOTE — Progress Notes (Signed)
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  He was admitted on 02/27/2020 secondary to paranoid delusions, angry and threatening behavior as well as noncompliance with treatment.  Objective: Patient is seen and examined.  Patient was also seen by the psychiatric resident today as well.  He seems to be improving.  His paranoia seems to be decreasing.  He stated today that he is willing to go back on the long-acting injectable medications for his illness.  Review of the electronic medical record revealed that earlier this year in February he had been given the long-acting paliperidone injection as well as oral Risperdal.  Review of the electronic medical record also revealed that the patient had had an earlier hospitalization at [facility] in September of this year.  His discharge medications are unknown.  Nursing notes were reviewed, and no events over the last 24 hours that were concerning.  His vital signs are stable, he is afebrile.  He slept 5.75 hours last night.  Review of his laboratories revealed a repeated CBC on 12/12.  His MCV is low at 75.9.  It does appear that his MCV has been low for at least 10 months.  His MCH is also been low for approximately the same period of time.  Differential was essentially normal.  On this hospitalization a TSH was not ordered, but was done in February of this year and was normal.  Principal Problem: Schizophrenia spectrum disorder with psychotic disorder type not yet determined (HCC) Diagnosis: Principal Problem:   Schizophrenia spectrum disorder with psychotic disorder type not yet determined [facility]) Active Problems:   Brief psychotic disorder (HCC)   Cannabis-induced psychotic disorder (HCC)   Episodic cannabis use  Total Time spent with patient: 20 minutes  Past  Psychiatric History: See admission H&P  Past Medical History:  Past Medical History:  Diagnosis Date  . Anemia   . Sickle cell trait [facility])     Past Surgical History:  Procedure Laterality Date  . CIRCUMCISION  2001  . LAPAROTOMY N/A 08/23/2019   Procedure: EXPLORATORY LAPAROTOMY;  Surgeon: Violeta Gelinas, MD;  Location: [facility] OR;  Service: General;  Laterality: N/A;   Family History: History reviewed. No pertinent family history. Family Psychiatric  History: See admission H&P Social History:  Social History   Substance and Sexual Activity  Alcohol Use Yes     Social History   Substance and Sexual Activity  Drug Use Yes  . Types: Methamphetamines, Marijuana   Comment: Daily    Social History   Socioeconomic History  . Marital status: Single    Spouse name: Not on file  . Number of children: Not on file  . Years of education: Not on file  . Highest education level: Not on file  Occupational History  . Not on file  Tobacco Use  . Smoking status: Current Some Day Smoker    Packs/day: 0.25    Years: 4.00    Pack years: 1.00    Types: Cigars  . Smokeless tobacco: Never Used  Vaping Use  . Vaping Use: Never used  Substance and Sexual Activity  . Alcohol use: Yes  . Drug use: Yes    Types: Methamphetamines, Marijuana    Comment: Daily  . Sexual activity: Never  Other Topics Concern  . Not on file  Social History Narrative  . Not on  file   Social Determinants of Health   Financial Resource Strain: Not on file  Food Insecurity: Not on file  Transportation Needs: Not on file  Physical Activity: Not on file  Stress: Not on file  Social Connections: Not on file   Additional Social History:                         Sleep: Fair  Appetite:  Good  Current Medications: Current Facility-Administered Medications  Medication Dose Route Frequency Provider Last Rate Last Admin  . acetaminophen (TYLENOL) tablet 650 mg  650 mg Oral Q6H PRN Jackelyn Poling, NP       . alum & mag hydroxide-simeth (MAALOX/MYLANTA) 200-200-20 MG/5ML suspension 30 mL  30 mL Oral Q4H PRN Nira Conn A, NP      . benztropine (COGENTIN) tablet 1 mg  1 mg Oral BID PRN Lauro [name], MD      . hydrOXYzine (ATARAX/VISTARIL) tablet 25 mg  25 mg Oral TID PRN Nira Conn A, NP      . OLANZapine zydis (ZYPREXA) disintegrating tablet 5 mg  5 mg Oral Q8H PRN Lauro [name], MD       And  . LORazepam (ATIVAN) tablet 1 mg  1 mg Oral PRN Lauro [name], MD       And  . ziprasidone (GEODON) injection 20 mg  20 mg Intramuscular PRN Lauro [name], MD      . magnesium hydroxide (MILK OF MAGNESIA) suspension 30 mL  30 mL Oral Daily PRN Nira Conn A, NP      . OLANZapine (ZYPREXA) injection 10 mg  10 mg Intramuscular QHS PRN Antonieta Pert, MD      . OLANZapine [facility]) injection 5 mg  5 mg Intramuscular Daily PRN Antonieta Pert, MD      . risperiDONE (RISPERDAL M-TABS) disintegrating tablet 2 mg  2 mg Oral Daily Antonieta Pert, MD   2 mg at 03/02/20 0730  . risperiDONE (RISPERDAL M-TABS) disintegrating tablet 3 mg  3 mg Oral QHS Antonieta Pert, MD   3 mg at 03/02/20 2039  . traZODone (DESYREL) tablet 50 mg  50 mg Oral QHS PRN Jackelyn Poling, NP        Lab Results: No results found for this or any previous visit (from the past 48 hour(s)).  Blood Alcohol level:  Lab Results  Component Value Date   ETH <10 02/25/2020   ETH <10 12/05/2019    Metabolic Disorder Labs: Lab Results  Component Value Date   HGBA1C 4.3 (L) 04/21/2019   MPG 76.71 04/21/2019   Lab Results  Component Value Date   PROLACTIN 15.2 04/21/2019   Lab Results  Component Value Date   CHOL 153 04/21/2019   TRIG 53 04/21/2019   HDL 52 04/21/2019   CHOLHDL 2.9 04/21/2019   VLDL 11 04/21/2019   LDLCALC 90 04/21/2019    Physical Findings: AIMS: Facial and Oral Movements Muscles of Facial Expression: None, normal Lips and Perioral Area: None, normal Jaw:  None, normal Tongue: None, normal,Extremity Movements Upper (arms, wrists, hands, fingers): None, normal Lower (legs, knees, ankles, toes): None, normal, Trunk Movements Neck, shoulders, hips: None, normal, Overall Severity Severity of abnormal movements (highest score from questions above): None, normal Incapacitation due to abnormal movements: None, normal Patient's awareness of abnormal movements (rate only patient's report): No Awareness, Dental Status Current problems with teeth and/or dentures?: No Does patient usually wear  dentures?: No  CIWA:  CIWA-Ar Total: 0 COWS:  COWS Total Score: 0  Musculoskeletal: Strength & Muscle Tone: within normal limits Gait & Station: normal Patient leans: N/A  Psychiatric Specialty Exam: Physical Exam Vitals and nursing note reviewed.  Constitutional:      Appearance: Normal appearance.  HENT:     Head: Normocephalic and atraumatic.  Pulmonary:     Effort: Pulmonary effort is normal.  Neurological:     General: No focal deficit present.     Mental Status: He is alert and oriented to person, place, and time.     Review of Systems  Blood pressure 128/84, pulse 89, temperature 97.7 F (36.5 C), temperature source Oral, resp. rate 18, height 5\' 10"  (1.778 m), weight 57.2 kg, SpO2 100 %.Body mass index is 18.08 kg/m.  General Appearance: Casual  Eye Contact:  Fair  Speech:  Normal Rate  Volume:  Normal  Mood:  Dysphoric  Affect:  Congruent  Thought Process:  Coherent and Descriptions of Associations: Loose  Orientation:  Full (Time, Place, and Person)  Thought Content:  Delusions and Paranoid Ideation  Suicidal Thoughts:  No  Homicidal Thoughts:  No  Memory:  Immediate;   Fair Recent;   Fair Remote;   Fair  Judgement:  Intact  Insight:  Fair  Psychomotor Activity:  Normal  Concentration:  Concentration: Fair and Attention Span: Fair  Recall:  of Knowledge:  Fair  Language:  Good  Akathisia:  Negative  Handed:   Right  AIMS (if indicated):     Assets:  Desire for Improvement Housing Resilience  ADL's:  Intact  Cognition:  WNL  Sleep:  Number of Hours: 5.75     Treatment Plan Summary: Daily contact with patient to assess and evaluate symptoms and progress in treatment, Medication management and Plan ; Patient is seen and examined.  Patient is a 19 year old male with the above-stated past psychiatric history who is seen in follow-up.  Diagnosis: 1.  Schizophrenia. 2.  Cannabis use disorder. 3.  Hypochromic microcytic anemia  Pertinent findings on examination today: 1.  Paranoia continues to slowly decrease. 2.  Patient is agreeable to a long-acting injectable antipsychotic. 3.  Sleep is fair  Plan: 1.  Give paliperidone long-acting injectable 234 mg IM x1 for psychosis. 2.  Increase Risperdal to 2 mg p.o. daily and 4 mg p.o. nightly.  We will continue the Risperdal as he has been treated with this when he is received the long-acting paliperidone injection in the past as well.  This is for psychosis. 3.  Order anemia panel 4.  Disposition planning-in progress. 12, MD 03/03/2020, 11:32 AM

## 2020-03-03 NOTE — BHH Counselor (Signed)
CSW faxed records request to [facility] for patients most recent hospitalization at their facility.     Ruthann Cancer MSW, LCSW Clincal Social Worker  [facility]

## 2020-03-03 NOTE — BHH Counselor (Signed)
CSW spoke with this patients sister, Garrett Bolton ([phone]), who stated that this patient is able to return to live with her post discharge. Patients sister informed CSW that this patient was hospitalized at [facility] for 4 days in September from 12/06/19 to 12/10/19.   CSW will request records from [facility] for recent admissions to their facility.    Ruthann Cancer MSW, LCSW Clincal Social Worker  [facility]

## 2020-03-03 NOTE — Tx Team (Signed)
Interdisciplinary Treatment and Diagnostic Plan Update  03/03/2020 Time of Session: 9:05am Garrett Bolton MRN: [id number]  Principal Diagnosis: Schizophrenia spectrum disorder with psychotic disorder type not yet determined [facility])  Secondary Diagnoses: Principal Problem:   Schizophrenia spectrum disorder with psychotic disorder type not yet determined [facility]) Active Problems:   Brief psychotic disorder (HCC)   Cannabis-induced psychotic disorder (HCC)   Episodic cannabis use   Current Medications:  Current Facility-Administered Medications  Medication Dose Route Frequency Provider Last Rate Last Admin  . acetaminophen (TYLENOL) tablet 650 mg  650 mg Oral Q6H PRN Jackelyn Poling, NP      . alum & mag hydroxide-simeth (MAALOX/MYLANTA) 200-200-20 MG/5ML suspension 30 mL  30 mL Oral Q4H PRN Nira Conn A, NP      . benztropine (COGENTIN) tablet 1 mg  1 mg Oral BID PRN Lauro [name], MD      . hydrOXYzine (ATARAX/VISTARIL) tablet 25 mg  25 mg Oral TID PRN Nira Conn A, NP      . OLANZapine zydis (ZYPREXA) disintegrating tablet 5 mg  5 mg Oral Q8H PRN Lauro [name], MD       And  . LORazepam (ATIVAN) tablet 1 mg  1 mg Oral PRN Lauro [name], MD       And  . ziprasidone (GEODON) injection 20 mg  20 mg Intramuscular PRN Lauro [name], MD      . magnesium hydroxide (MILK OF MAGNESIA) suspension 30 mL  30 mL Oral Daily PRN Nira Conn A, NP      . OLANZapine (ZYPREXA) injection 10 mg  10 mg Intramuscular QHS PRN Antonieta Pert, MD      . OLANZapine [facility]) injection 5 mg  5 mg Intramuscular Daily PRN Antonieta Pert, MD      . risperiDONE (RISPERDAL M-TABS) disintegrating tablet 2 mg  2 mg Oral Daily Antonieta Pert, MD   2 mg at 03/02/20 0730  . risperiDONE (RISPERDAL M-TABS) disintegrating tablet 3 mg  3 mg Oral QHS Antonieta Pert, MD   3 mg at 03/02/20 2039  . traZODone (DESYREL) tablet 50 mg  50 mg Oral QHS PRN Jackelyn Poling, NP        PTA Medications: Medications Prior to Admission  Medication Sig Dispense Refill Last Dose  . acetaminophen (TYLENOL) 500 MG tablet Take 2 tablets (1,000 mg total) by mouth every 6 (six) hours as needed. (Patient not taking: Reported on 12/05/2019) 30 tablet 0   . benztropine (COGENTIN) 1 MG tablet Take 1 tablet (1 mg total) by mouth daily. (Patient not taking: Reported on 08/22/2019) 30 tablet 0   . divalproex (DEPAKOTE) 500 MG DR tablet Take 500 mg by mouth 2 (two) times daily. (Patient not taking: Reported on 02/25/2020)     . methocarbamol (ROBAXIN) 500 MG tablet Take 2 tablets (1,000 mg total) by mouth every 8 (eight) hours as needed for muscle spasms. (Patient not taking: Reported on 12/05/2019) 30 tablet 0   . omega-3 acid ethyl esters (LOVAZA) 1 g capsule Take 1 capsule (1 g total) by mouth 2 (two) times daily. (Patient not taking: Reported on 12/05/2019) 60 capsule 0   . oxyCODONE (OXY IR/ROXICODONE) 5 MG immediate release tablet Take 1 tablet (5 mg total) by mouth every 4 (four) hours as needed for moderate pain. (Patient not taking: Reported on 12/05/2019) 20 tablet 0   . paliperidone (INVEGA SUSTENNA) 234 MG/1.5ML SUSY injection Inject 234 mg into the muscle once for  1 dose. Due approx 3/6 (Patient not taking: Reported on 08/22/2019) 1.5 mL 1   . polyethylene glycol (MIRALAX / GLYCOLAX) 17 g packet Take 17 g by mouth daily. (Patient not taking: Reported on 12/05/2019) 14 each 0   . risperiDONE (RISPERDAL) 2 MG tablet 3 at hs x 10 days the 2 at hs then on (Patient not taking: Reported on 08/22/2019) 70 tablet 0     Patient Stressors: Marital or family conflict Medication change or noncompliance  Patient Strengths: Geographical information systems officer for treatment/growth  Treatment Modalities: Medication Management, Group therapy, Case management,  1 to 1 session with clinician, Psychoeducation, Recreational therapy.   Physician Treatment Plan for Primary Diagnosis: Schizophrenia spectrum  disorder with psychotic disorder type not yet determined (HCC) Long Term Goal(s): Improvement in symptoms so as ready for discharge Improvement in symptoms so as ready for discharge   Short Term Goals: Ability to identify changes in lifestyle to reduce recurrence of condition will improve Ability to verbalize feelings will improve Ability to demonstrate self-control will improve Ability to identify and develop effective coping behaviors will improve Compliance with prescribed medications will improve Ability to identify triggers associated with substance abuse/mental health issues will improve Ability to identify changes in lifestyle to reduce recurrence of condition will improve Ability to verbalize feelings will improve Ability to demonstrate self-control will improve Ability to identify and develop effective coping behaviors will improve Compliance with prescribed medications will improve Ability to identify triggers associated with substance abuse/mental health issues will improve  Medication Management: Evaluate patient's response, side effects, and tolerance of medication regimen.  Therapeutic Interventions: 1 to 1 sessions, Unit Group sessions and Medication administration.  Evaluation of Outcomes: Progressing  Physician Treatment Plan for Secondary Diagnosis: Principal Problem:   Schizophrenia spectrum disorder with psychotic disorder type not yet determined (HCC) Active Problems:   Brief psychotic disorder (HCC)   Cannabis-induced psychotic disorder (HCC)   Episodic cannabis use  Long Term Goal(s): Improvement in symptoms so as ready for discharge Improvement in symptoms so as ready for discharge   Short Term Goals: Ability to identify changes in lifestyle to reduce recurrence of condition will improve Ability to verbalize feelings will improve Ability to demonstrate self-control will improve Ability to identify and develop effective coping behaviors will  improve Compliance with prescribed medications will improve Ability to identify triggers associated with substance abuse/mental health issues will improve Ability to identify changes in lifestyle to reduce recurrence of condition will improve Ability to verbalize feelings will improve Ability to demonstrate self-control will improve Ability to identify and develop effective coping behaviors will improve Compliance with prescribed medications will improve Ability to identify triggers associated with substance abuse/mental health issues will improve     Medication Management: Evaluate patient's response, side effects, and tolerance of medication regimen.  Therapeutic Interventions: 1 to 1 sessions, Unit Group sessions and Medication administration.  Evaluation of Outcomes: Progressing   RN Treatment Plan for Primary Diagnosis: Schizophrenia spectrum disorder with psychotic disorder type not yet determined (HCC) Long Term Goal(s): Knowledge of disease and therapeutic regimen to maintain health will improve  Short Term Goals: Ability to remain free from injury will improve, Ability to verbalize frustration and anger appropriately will improve, Ability to demonstrate self-control, Ability to participate in decision making will improve, Ability to verbalize feelings will improve, Ability to identify and develop effective coping behaviors will improve and Compliance with prescribed medications will improve  Medication Management: RN will administer medications as ordered by provider,  will assess and evaluate patient's response and provide education to patient for prescribed medication. RN will report any adverse and/or side effects to prescribing provider.  Therapeutic Interventions: 1 on 1 counseling sessions, Psychoeducation, Medication administration, Evaluate responses to treatment, Monitor vital signs and CBGs as ordered, Perform/monitor CIWA, COWS, AIMS and Fall Risk screenings as ordered,  Perform wound care treatments as ordered.  Evaluation of Outcomes: Progressing   LCSW Treatment Plan for Primary Diagnosis: Schizophrenia spectrum disorder with psychotic disorder type not yet determined (HCC) Long Term Goal(s): Safe transition to appropriate next level of care at discharge, Engage patient in therapeutic group addressing interpersonal concerns.  Short Term Goals: Engage patient in aftercare planning with referrals and resources, Increase social support, Increase ability to appropriately verbalize feelings, Increase emotional regulation, Identify triggers associated with mental health/substance abuse issues and Increase skills for wellness and recovery  Therapeutic Interventions: Assess for all discharge needs, 1 to 1 time with Social worker, Explore available resources and support systems, Assess for adequacy in community support network, Educate family and significant other(s) on suicide prevention, Complete Psychosocial Assessment, Interpersonal group therapy.  Evaluation of Outcomes: Progressing   Progress in Treatment: Attending groups: Yes. Participating in groups: Yes. Taking medication as prescribed: Yes. Toleration medication: Yes. Family/Significant other contact made: No, will contact:  Have attempted to contact sister Patient understands diagnosis: No. Discussing patient identified problems/goals with staff: Yes. Medical problems stabilized or resolved: Yes.  Denies suicidal/homicidal ideation: Yes. Issues/concerns per patient self-inventory: No.   New problem(s) identified: No, Describe:  none  New Short Term/Long Term Goal(s): medication stabilization, elimination of SI thoughts, development of comprehensive mental wellness plan.   Patient Goals:  "To get out"  Discharge Plan or Barriers: Patient recently admitted. CSW will continue to follow and assess for appropriate referrals and possible discharge planning.   Reason for Continuation of  Hospitalization: Delusions  Medication stabilization  Estimated Length of Stay: 3-5 days  Attendees: Patient:   03/03/2020  Physician: 03/03/2020   Nursing:  03/03/2020   RN Care Manager: 03/03/2020   Social Worker: Ruthann Cancer, LCSW 03/03/2020   Recreational Therapist:  03/03/2020   Other:  03/03/2020   Other:  03/03/2020  Other: 03/03/2020    Scribe for Treatment Team: Otelia Santee, LCSW 03/03/2020 10:58 AM

## 2020-03-03 NOTE — Progress Notes (Signed)
°   03/03/20 0601  Vital Signs  Temp 97.7 F (36.5 C)  Temp Source Oral  Pulse Rate 75  Resp 18  BP (!) 124/91  BP Location Right Arm  BP Method Automatic  Patient Position (if appropriate) Sitting   D: Patient denies SI/HI/AVH. Pt. Requested to take morning meds later. Pt. Out in open areas.  A:  Patient took scheduled medicine.  Support and encouragement provided Routine safety checks conducted every 15 minutes. Patient  Informed to notify staff with any concerns.   R:  Safety maintained.

## 2020-03-03 NOTE — BHH Group Notes (Signed)
The focus of this group is to help patients establish daily goals to achieve during treatment and discuss how the patient can incorporate goal setting into their daily lives to aide in recovery.Patient attended group. 

## 2020-03-03 NOTE — BHH Group Notes (Signed)
Adult Psychoeducational Group Note  Date:  03/03/2020 Time:  11:54 AM  Group Topic/Focus:  Making Healthy Choices:   The focus of this group is to help patients identify negative/unhealthy choices they were using prior to admission and identify positive/healthier coping strategies to replace them upon discharge.  Participation Level:  Minimal  Participation Quality:  Appropriate  Affect:  Flat  Cognitive:  Appropriate  Insight: Appropriate  Engagement in Group:  Limited  Modes of Intervention:  Discussion  Additional Comments:  Pt attended morning group session.   Deforest Hoyles Reynalda Canny 03/03/2020, 11:54 AM

## 2020-03-03 NOTE — Progress Notes (Signed)
Pt has been observed in the milieu interacting with peers. Pt stated had a good day and has been responding well to the medications. Pt stated he feels ready for discharge. Pt denied SI/HI and contracted for safety, will continue to monitor.

## 2020-03-04 MED ORDER — RISPERIDONE 4 MG PO TBDP: 4.0000 mg | ORAL_TABLET | Freq: Every day | ORAL | 0 refills | Status: DC

## 2020-03-04 MED ORDER — RISPERIDONE 2 MG PO TBDP: 2.0000 mg | ORAL_TABLET | Freq: Every day | ORAL | 0 refills | Status: DC

## 2020-03-04 MED ORDER — PALIPERIDONE PALMITATE ER 156 MG/ML IM SUSY: 156.0000 mg | PREFILLED_SYRINGE | Freq: Once | INTRAMUSCULAR | 0 refills | Status: DC

## 2020-03-04 NOTE — Plan of Care (Signed)
  Problem: Activity: Goal: Will verbalize the importance of balancing activity with adequate rest periods Outcome: Adequate for Discharge   Problem: Education: Goal: Will be free of psychotic symptoms Outcome: Adequate for Discharge Goal: Knowledge of the prescribed therapeutic regimen will improve Outcome: Adequate for Discharge   Problem: Coping: Goal: Coping ability will improve Outcome: Adequate for Discharge Goal: Will verbalize feelings Outcome: Adequate for Discharge   Problem: Health Behavior/Discharge Planning: Goal: Compliance with prescribed medication regimen will improve Outcome: Adequate for Discharge   Problem: Nutritional: Goal: Ability to achieve adequate nutritional intake will improve Outcome: Adequate for Discharge   Problem: Role Relationship: Goal: Ability to communicate needs accurately will improve Outcome: Adequate for Discharge Goal: Ability to interact with others will improve Outcome: Adequate for Discharge   Problem: Safety: Goal: Ability to redirect hostility and anger into socially appropriate behaviors will improve Outcome: Adequate for Discharge Goal: Ability to remain free from injury will improve Outcome: Adequate for Discharge   Problem: Self-Care: Goal: Ability to participate in self-care as condition permits will improve Outcome: Adequate for Discharge   Problem: Self-Concept: Goal: Will verbalize positive feelings about self Outcome: Adequate for Discharge   Problem: Education: Goal: Knowledge of [facility] Education information/materials will improve Outcome: Adequate for Discharge Goal: Emotional status will improve Outcome: Adequate for Discharge Goal: Mental status will improve Outcome: Adequate for Discharge Goal: Verbalization of understanding the information provided will improve Outcome: Adequate for Discharge   Problem: Activity: Goal: Interest or engagement in activities will improve Outcome: Adequate  for Discharge Goal: Sleeping patterns will improve Outcome: Adequate for Discharge   Problem: Coping: Goal: Ability to verbalize frustrations and anger appropriately will improve Outcome: Adequate for Discharge Goal: Ability to demonstrate self-control will improve Outcome: Adequate for Discharge   Problem: Health Behavior/Discharge Planning: Goal: Identification of resources available to assist in meeting health care needs will improve Outcome: Adequate for Discharge Goal: Compliance with treatment plan for underlying cause of condition will improve Outcome: Adequate for Discharge   Problem: Physical Regulation: Goal: Ability to maintain clinical measurements within normal limits will improve Outcome: Adequate for Discharge   Problem: Safety: Goal: Periods of time without injury will increase Outcome: Adequate for Discharge   Problem: Education: Goal: Ability to verbalize precipitating factors for violent behavior will improve Outcome: Adequate for Discharge   Problem: Coping: Goal: Ability to verbalize frustrations and anger appropriately will improve Outcome: Adequate for Discharge   Problem: Health Behavior/Discharge Planning: Goal: Ability to implement measures to prevent violent behavior in the future will improve Outcome: Adequate for Discharge   Problem: Safety: Goal: Ability to demonstrate self-control will improve Outcome: Adequate for Discharge Goal: Ability to redirect hostility and anger into socially appropriate behaviors will improve Outcome: Adequate for Discharge  Patient discharged to home/self care in the presence of his family.

## 2020-03-04 NOTE — Progress Notes (Signed)
Recreation Therapy Notes  INPATIENT RECREATION TR PLAN  Patient Details Name: Garrett Bolton MRN: [id number] DOB: 11/20/2000 Today's Date: 03/04/2020  Rec Therapy Plan Is patient appropriate for Therapeutic Recreation?: Yes Treatment times per week: about 3 days Estimated Length of Stay: 5-7 days TR Treatment/Interventions: Group participation (Comment)  Discharge Criteria Pt will be discharged from therapy if:: Discharged Treatment plan/goals/alternatives discussed and agreed upon by:: Patient/family  Discharge Summary Short term goals set: See patient care plan Short term goals met: Adequate for discharge Progress toward goals comments: Groups attended Which groups?: Self-esteem,Other (Comment) (STEM) Reason goals not met: None Therapeutic equipment acquired: N/A Reason patient discharged from therapy: Discharge from hospital Pt/family agrees with progress & goals achieved: Yes Date patient discharged from therapy: 03/04/20     , LRT/CTRS  ,  A 03/04/2020, 11:45 AM  
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
## 2020-03-04 NOTE — Progress Notes (Signed)
Patient ID: Garrett Bolton, male   DOB: December 27, 2000, 19 y.o.   MRN: [id number]   Patient discharged to home/self care in the presence of his sister.  Patient denies SI, HI and AVH upon discharge.  Patient acknowledged understanding of all discharge instructions. Patient endorses willingness to complete follow up care in the community.

## 2020-03-04 NOTE — Progress Notes (Signed)
Pt stated he was feeling better, pt more interactive with staff and could carry a coherent conversation    03/04/20 0300  Psych Admission Type (Psych Patients Only)  Admission Status Involuntary  Psychosocial Assessment  Patient Complaints None  Eye Contact Fair  Facial Expression Fixed smile  Affect Appropriate to circumstance  Speech Logical/coherent  Interaction Cautious  Motor Activity Fidgety  Appearance/Hygiene Unremarkable  Behavior Characteristics Cooperative  Mood Anxious;Pleasant  Thought Process  Coherency WDL  Content WDL  Delusions None reported or observed  Perception WDL  Hallucination None reported or observed  Judgment Poor  Confusion Mild  Danger to Self  Current suicidal ideation? Denies  Danger to Others  Danger to Others None reported or observed

## 2020-03-04 NOTE — Plan of Care (Signed)
Pt was able to identify some things that trigger his anger at completion of recreation therapy group sessions.    Caroll Rancher, LRT/CTRS

## 2020-03-04 NOTE — [facility] Group Notes (Signed)
The focus of this group is to help patients establish daily goals to achieve during treatment and discuss how the patient can incorporate goal setting into their daily lives to aide in recovery.  Pt stated that his goal is to,"Stay positive and follow his discharge plan."

## 2020-03-04 NOTE — BHH Suicide Risk Assessment (Signed)
[facility] Discharge Suicide Risk Assessment   Principal Problem: Schizophrenia spectrum disorder with psychotic disorder type not yet determined [facility]) Discharge Diagnoses: Principal Problem:   Schizophrenia spectrum disorder with psychotic disorder type not yet determined (HCC) Active Problems:   Brief psychotic disorder (HCC)   Cannabis-induced psychotic disorder (HCC)   Episodic cannabis use   Total Time spent with patient: 20 minutes  Musculoskeletal: Strength & Muscle Tone: within normal limits Gait & Station: normal Patient leans: N/A  Psychiatric Specialty Exam: Review of Systems  All other systems reviewed and are negative.   Blood pressure 140/71, pulse (!) 126, temperature (!) 97.3 F (36.3 C), temperature source Oral, resp. rate 18, height 5\' 10"  (1.778 m), weight 57.2 kg, SpO2 100 %.Body mass index is 18.08 kg/m.  General Appearance: Casual  Eye Contact::  Good  Speech:  Normal Rate409  Volume:  Normal  Mood:  Anxious  Affect:  Congruent  Thought Process:  Coherent and Descriptions of Associations: Intact  Orientation:  Full (Time, Place, and Person)  Thought Content:  Logical  Suicidal Thoughts:  No  Homicidal Thoughts:  No  Memory:  Immediate;   Fair Recent;   Fair Remote;   Fair  Judgement:  Intact  Insight:  Fair  Psychomotor Activity:  Normal  Concentration:  Fair  Recall:  [id number] of Knowledge:Fair  Language: Fair  Akathisia:  Negative  Handed:  Right  AIMS (if indicated):     Assets:  Desire for Improvement Housing Resilience Social Support  Sleep:  Number of Hours: 6.75  Cognition: WNL  ADL's:  Intact   Mental Status Per Nursing Assessment::   On Admission:  NA  Demographic Factors:  Male, Adolescent or young adult, Low socioeconomic status and Unemployed  Loss Factors: Financial problems/change in socioeconomic status  Historical Factors: Impulsivity  Risk Reduction Factors:   Living with another person, especially a relative and  Positive social support  Continued Clinical Symptoms:  Schizophrenia:   Less than 10 years old Paranoid or undifferentiated type  Cognitive Features That Contribute To Risk:  None    Suicide Risk:  Minimal: No identifiable suicidal ideation.  Patients presenting with no risk factors but with morbid ruminations; may be classified as minimal risk based on the severity of the depressive symptoms   Follow-up Information    41, MD Follow up in 2 week(s).   Specialty: Cardiology Why: New address is [address] [place] [address] [place] Contact information: [address] [place] [phone]        BEHAVIORAL HEALTH OUTPATIENT THERAPY [place]. Go on 03/10/2020.   Specialty: Behavioral Health Why: You have an appointment for therapy services on 03/10/20 at 10:30 am.  This appointment will be held in person.  Please bring your photo ID and insurance card.  Only the patient is allowed in the building. Contact information: [address] [place] [phone]       [facility] Follow up on 04/01/2020.   Why: You have an appointment for medication management services 04/01/20 at 1:20 pm.  This will be Virtual. Contact information: [address] [place] [phone]               Plan Of Care/Follow-up recommendations:  Activity:  ad lib Other:  your second Invega injection 156 mg is due 12/21. Present to [facility] on your follow up appointment to receive your injection.  [facility]  Jola Babinski, MD 03/04/2020, 7:40 AM

## 2020-03-04 NOTE — Progress Notes (Signed)
  [facility] Adult Case Management Discharge Plan :  Will you be returning to the same living situation after discharge:  Yes,  to live with sister At discharge, do you have transportation home?: No. Safe Transport to be arramged Do you have the ability to pay for your medications: Yes,  has insurance   Release of information consent forms completed and in the chart;  Patient's signature needed at discharge.  Patient to Follow up at:  Follow-up Information    Orpah Cobb, MD Follow up in 2 week(s).   Specialty: Cardiology Why: New address is [address] [place] [address] Contact information: [address] [place] [address] [phone]        BEHAVIORAL HEALTH OUTPATIENT THERAPY [place]. Go on 03/10/2020.   Specialty: Behavioral Health Why: You have an appointment for therapy services on 03/10/20 at 10:30 am.  This appointment will be held in person.  Please bring your photo ID and insurance card.  Only the patient is allowed in the building. Virtual appointments can be made after 1st appt. Contact information: [address] [id number] mc [place] [address] [phone]       [facility] Follow up on 04/01/2020.   Why: You have an appointment for medication management services 04/01/20 at 1:20 pm.  This will be Virtual. Contact information: [address] [place] [address] [phone]               Next level of care provider has access to [facility] Link:yes  Safety Planning and Suicide Prevention discussed: Yes,  with sister  Have you used any form of tobacco in the last 30 days? (Cigarettes, Smokeless Tobacco, Cigars, and/or Pipes): Yes  Has patient been referred to the Quitline?: Patient refused referral  Patient has been referred for addiction treatment: Pt. refused referral  Otelia Santee, LCSW 03/04/2020, 9:35 AM

## 2020-03-04 NOTE — Progress Notes (Signed)
Recreation Therapy Notes  Date: 12.16.21 Time: 3143-8887 Location: [address] Day[address]  Group Topic: Self-Esteem  Goal Area(s) Addresses:  Patient will identify positive character traits about themselves. Patient will reflect on positive ways to increase self-esteem. Patient will verbalize benefit of increased self-esteem.  Behavioral Response: Engaged  Intervention: Blank license plates, Markers, Colored pencils  Activity: Personalized License Plates.  Patients were to create a personalized license plate to highlight some of the positive things about themselves.  Patients could highlights things such as were they are from, sports teams they like, personality traits, important people to them, etc.  Education:  Self-esteem, Discharge planning  Education Outcome: Acknowledges understanding/In group clarification offered/Needs additional education  Clinical Observations/Feedback: Pt stated positive self esteem can "take you places".  Pt also expressed some of his positive traits were how he speaks and how he treats people.  Pt drew on his license plate three crosses, a soccer ball, a man hitting a boxing bag and being from [place].  Pt expressed it was easy for him to come up with the things about himself because it came "off the top of my head".    Caroll Rancher, LRT/CTRS    Lillia Abed, Kameshia Madruga A 03/04/2020 10:28 AM

## 2020-03-10 ENCOUNTER — Ambulatory Visit (HOSPITAL_COMMUNITY): Payer: Federal, State, Local not specified - PPO | Admitting: Clinical

## 2020-08-06 ENCOUNTER — Emergency Department (HOSPITAL_COMMUNITY): Payer: Federal, State, Local not specified - PPO

## 2020-08-06 ENCOUNTER — Emergency Department (HOSPITAL_COMMUNITY)
Admission: EM | Admit: 2020-08-06 | Discharge: 2020-08-07 | Disposition: A | Discharge status: Home / Self Care | Payer: Federal, State, Local not specified - PPO | Attending: Emergency Medicine | Admitting: Emergency Medicine

## 2020-08-06 ENCOUNTER — Other Ambulatory Visit: Payer: Self-pay

## 2020-08-06 ENCOUNTER — Encounter (HOSPITAL_COMMUNITY): Payer: Self-pay

## 2020-08-06 DIAGNOSIS — T23061A Burn of unspecified degree of back of right hand, initial encounter: Secondary | ICD-10-CM | POA: Insufficient documentation

## 2020-08-06 DIAGNOSIS — S60412A Abrasion of right middle finger, initial encounter: Secondary | ICD-10-CM | POA: Insufficient documentation

## 2020-08-06 DIAGNOSIS — S60416A Abrasion of right little finger, initial encounter: Secondary | ICD-10-CM | POA: Insufficient documentation

## 2020-08-06 DIAGNOSIS — X088XXA Exposure to other specified smoke, fire and flames, initial encounter: Secondary | ICD-10-CM | POA: Diagnosis not present

## 2020-08-06 DIAGNOSIS — F17290 Nicotine dependence, other tobacco product, uncomplicated: Secondary | ICD-10-CM | POA: Diagnosis not present

## 2020-08-06 DIAGNOSIS — S60414A Abrasion of right ring finger, initial encounter: Secondary | ICD-10-CM | POA: Diagnosis not present

## 2020-08-06 DIAGNOSIS — W228XXA Striking against or struck by other objects, initial encounter: Secondary | ICD-10-CM | POA: Insufficient documentation

## 2020-08-06 DIAGNOSIS — S6991XA Unspecified injury of right wrist, hand and finger(s), initial encounter: Secondary | ICD-10-CM

## 2020-08-06 DIAGNOSIS — R001 Bradycardia, unspecified: Secondary | ICD-10-CM | POA: Insufficient documentation

## 2020-08-06 DIAGNOSIS — Y990 Civilian activity done for income or pay: Secondary | ICD-10-CM | POA: Diagnosis not present

## 2020-08-06 MED ORDER — TETANUS-DIPHTH-ACELL PERTUSSIS 5-2.5-18.5 LF-MCG/0.5 IM SUSY
0.5000 mL | PREFILLED_SYRINGE | Freq: Once | INTRAMUSCULAR | Status: DC
  Filled 2020-08-06: qty 0.5
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
## 2020-08-07 MED ORDER — NAPROXEN 500 MG PO TABS: 500.0000 mg | ORAL_TABLET | Freq: Two times a day (BID) | ORAL | 0 refills | Status: DC | PRN

## 2020-08-07 MED ORDER — BACITRACIN ZINC 500 UNIT/GM EX OINT: TOPICAL_OINTMENT | Freq: Two times a day (BID) | CUTANEOUS | Status: DC

## 2020-08-07 NOTE — Discharge Instructions (Signed)
You were seen in the emergency department tonight for a hand injury Your x-ray did not show a fracture or dislocation.  Please follow attached wound care and burn care instructions.  Apply Neosporin (over the counter antibiotic ointment) to the burned area and wound area 3-4 times per day until the area has healed.   I have prescribed you Naproxen for the discomfort.  This is a nonsteroidal anti-inflammatory medication that will help with pain and swelling.  You may take this once every 12 hours as needed for pain and swelling, be sure to take this with food as it can cause stomach upset and at worst stomach bleeding.  Do not take Advil, ibuprofen, or Aleve while taking this medicine as it will further irritate your stomach.  You may supplement with Tylenol- per bottle dosing instructions.   We have prescribed you new medication(s) today. Discuss the medications prescribed today with your pharmacist as they can have adverse effects and interactions with your other medicines including over the counter and prescribed medications. Seek medical evaluation if you start to experience new or abnormal symptoms after taking one of these medicines, seek care immediately if you start to experience difficulty breathing, feeling of your throat closing, facial swelling, or rash as these could be indications of a more serious allergic reaction  Please utilize the finger splinting technique to help the wounds on the back of your fingers heal as they are over joints and will continue to open with movement of those fingers.   Please follow-up with your primary care provider within 3 days for reevaluation.  We have also provided our hand surgeon on-call's information for follow-up.  Return to the emergency department for any new or worsening symptoms including, but not limited to increased redness or warmth around the burn, purulent discharge from the burn, fever, chills, nausea, vomiting, numbness, weakness, or any other  concerns.
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
## 2020-08-07 NOTE — ED Notes (Signed)
Wounds irrigated on right hand. ointment applied and wrapped with gauze. Middle and ring fingers buddy tapped Pt educated on care of wounds and trying to keep fingers straight as much as possible to improve healing

## 2020-10-05 ENCOUNTER — Other Ambulatory Visit: Payer: Self-pay

## 2020-10-05 ENCOUNTER — Encounter (HOSPITAL_COMMUNITY): Payer: Self-pay

## 2020-10-05 ENCOUNTER — Ambulatory Visit (HOSPITAL_COMMUNITY)
Admission: EM | Admit: 2020-10-05 | Discharge: 2020-10-05 | Disposition: A | Discharge status: Home / Self Care | Payer: Federal, State, Local not specified - PPO

## 2020-10-05 DIAGNOSIS — H00015 Hordeolum externum left lower eyelid: Secondary | ICD-10-CM | POA: Diagnosis not present

## 2020-10-05 NOTE — ED Provider Notes (Signed)
[facility]    CSN: [id number] Arrival date & time: 10/05/20  1830      History   Chief Complaint Chief Complaint  Patient presents with   Eye Problem    HPI Garrett Bolton is a 20 y.o. male.   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   Brief psychotic disorder (HCC)    S/P exploratory laparotomy 08/23/2019   Schizophrenia spectrum disorder with psychotic disorder type not yet determined (HCC) 04/25/2019   Changing skin lesion 04/01/2013   Postconcussion syndrome 07/26/2012   Headache(784.0) 07/26/2012    Past Surgical History:  Procedure Laterality Date   CIRCUMCISION  2001   LAPAROTOMY N/A 08/23/2019   Procedure: EXPLORATORY LAPAROTOMY;  Surgeon: Violeta Gelinas, MD;  Location: [facility] OR;  Service: General;  Laterality: N/A;       Home Medications    Prior to Admission medications   Medication Sig Start Date End Date Taking? Authorizing Provider  benztropine (COGENTIN) 1 MG tablet Take 1 tablet (1 mg total) by mouth daily. Patient not taking: Reported on 08/22/2019 04/29/19 04/28/20  Aldean Baker, NP  naproxen (NAPROSYN) 500 MG tablet Take 1 tablet (500 mg total) by mouth 2 (two) times daily as needed for moderate pain. 08/07/20   Petrucelli, Samantha R, PA-C  paliperidone (INVEGA SUSTENNA) 156 MG/ML SUSY injection Inject 1 mL (156 mg total) into the muscle once for 1 dose. 03/10/20 03/10/20  Bobbye Morton, MD  paliperidone (INVEGA SUSTENNA) 234 MG/1.5ML SUSY injection INJECT INTO THE MUSCLE AT FACILITY 03/03/20 03/03/21  Antonieta Pert, MD   risperiDONE (RISPERDAL M-TABS) 2 MG disintegrating tablet Take 1 tablet (2 mg total) by mouth daily. 03/05/20   Bobbye Morton, MD  risperiDONE (RISPERDAL M-TABS) 4 MG disintegrating tablet Take 1 tablet (4 mg total) by mouth at bedtime. 03/04/20   Bobbye Morton, MD    Family History History reviewed. No pertinent family history.  Social History Social History   Tobacco Use   Smoking status: Some Days    Packs/day: 0.25    Years: 4.00    Pack years: 1.00    Types: Cigars, Cigarettes   Smokeless tobacco: Never  Vaping Use   Vaping Use: Never used  Substance Use Topics   Alcohol use: Yes   Drug use: Yes    Types: Methamphetamines, Marijuana    Comment: Daily     Allergies   Patient has no known allergies.   Review of Systems Review of Systems  Constitutional:  Negative for activity change and appetite change.  HENT:  Negative for congestion.   Eyes:  Negative for pain, discharge and redness.       Left lower eyelid swelling and redness  Respiratory:  Negative for shortness of breath.   Cardiovascular:  Negative for chest pain.  Skin:  Negative for rash and wound.    Physical Exam Triage Vital Signs ED Triage Vitals  Enc Vitals Group     BP 10/05/20 1908 (!) 137/96     Pulse Rate 10/05/20  1908 87     Resp 10/05/20 1908 18     Temp 10/05/20 1908 98 F (36.7 C)     Temp Source 10/05/20 1908 Oral     SpO2 10/05/20 1908 96 %     Weight --      Height --      Head Circumference --      Peak Flow --      Pain Score 10/05/20 1907 4     Pain Loc --      Pain Edu? --      Excl. in GC? --    No data found.  Updated Vital Signs BP (!) 137/96 (BP Location: Right Arm)   Pulse 87   Temp 98 F (36.7 C) (Oral)   Resp 18   SpO2 96%   Visual Acuity Right Eye Distance:   Left Eye Distance:   Bilateral Distance:    Right Eye Near:   Left Eye Near:    Bilateral Near:     Physical Exam Constitutional:      General: He is not in acute distress.     Appearance: Normal appearance. He is not ill-appearing or toxic-appearing.  HENT:     Head: Normocephalic and atraumatic.  Eyes:     General: No scleral icterus.       Right eye: No discharge.        Left eye: No discharge.     Extraocular Movements: Extraocular movements intact.     Conjunctiva/sclera: Conjunctivae normal.     Pupils: Pupils are equal, round, and reactive to light.   Neurological:     Mental Status: He is alert.     UC Treatments / Results  Labs (all labs ordered are listed, but only abnormal results are displayed) Labs Reviewed - No data to display  EKG   Radiology No results found.  Procedures Procedures (including critical care time)  Medications Ordered in UC Medications - No data to display  Initial Impression / Assessment and Plan / UC Course  I have reviewed the triage vital signs and the nursing notes.  Pertinent labs & imaging results that were available during my care of the patient were reviewed by me and considered in my medical decision making (see chart for details).     Patient is a 20 year old male with past medical history significant for sickle cell trait.  He presents with history and exam consistent with a stye.  He was full movement of his eye without pain.  Pupils are round and reactive which is reassuring.  Does not appear to have any eye trauma.  There is no foreign body observed in the eye.  He has no pain with turning his eye.  Very unlikely that something actually got in his eye and caused this.  Discussed with patient that warm compresses are the mainstay of treatment.  No need for antibiotics at this time.  He will use warm compresses she was advised to use as much as possible.  Advised to follow-up in 2 weeks with an eye doctor if he does not have improvement.  He was discharged home in stable condition.  Final Clinical Impressions(s) / UC Diagnoses   Final diagnoses:  Hordeolum externum of left lower eyelid      Discharge Instructions      You have a stye.  This is essentially a blocked gland.  We will treat this with this warm compresses, the more the better.  You should do this at  least 4 times a day, if not more.  You can use a boiled egg and wrap it in a washcloth to hold the heat in.  You can also use a heating pad that has a rice in it.  If this does not improve over the next 2 weeks, you should see an eye doctor.  If you have pain with movement of your eye or changes in your vision, you should be seen by medical provider right away.   ED Prescriptions   None    PDMP not reviewed this encounter.   Unknown Jim, DO 10/05/20 2038

## 2020-10-05 NOTE — Discharge Instructions (Addendum)
You have a stye.  This is essentially a blocked gland.  We will treat this with this warm compresses, the more the better.  You should do this at least 4 times a day, if not more.  You can use a boiled egg and wrap it in a washcloth to hold the heat in.  You can also use a heating pad that has a rice in it.  If this does not improve over the next 2 weeks, you should see an eye doctor.  If you have pain with movement of your eye or changes in your vision, you should be seen by medical provider right away.

## 2020-10-05 NOTE — ED Triage Notes (Signed)
Pt states something flew in his eye 3 days ago and now he has swelling and discomfort.

## 2020-10-27 ENCOUNTER — Emergency Department (HOSPITAL_COMMUNITY)
Admission: EM | Admit: 2020-10-27 | Discharge: 2020-10-27 | Disposition: A | Discharge status: Home / Self Care | Payer: Federal, State, Local not specified - PPO | Attending: Emergency Medicine | Admitting: Emergency Medicine

## 2020-10-27 DIAGNOSIS — Z5321 Procedure and treatment not carried out due to patient leaving prior to being seen by health care provider: Secondary | ICD-10-CM | POA: Insufficient documentation

## 2020-10-27 DIAGNOSIS — H5712 Ocular pain, left eye: Secondary | ICD-10-CM | POA: Diagnosis not present
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
## 2020-10-27 NOTE — ED Notes (Signed)
Patient called x2 for vitals with no response 

## 2020-10-27 NOTE — ED Notes (Signed)
Patient called x1 for vitals recheck with no response 
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
## 2021-01-01 ENCOUNTER — Ambulatory Visit (HOSPITAL_COMMUNITY)
Admission: EM | Admit: 2021-01-01 | Discharge: 2021-01-02 | Disposition: A | Discharge status: Home / Self Care | Payer: Medicaid Other | Attending: Behavioral Health | Admitting: Behavioral Health

## 2021-01-01 ENCOUNTER — Encounter (HOSPITAL_COMMUNITY): Payer: Self-pay

## 2021-01-01 ENCOUNTER — Other Ambulatory Visit: Payer: Self-pay

## 2021-01-01 DIAGNOSIS — F200 Paranoid schizophrenia: Secondary | ICD-10-CM | POA: Insufficient documentation

## 2021-01-01 DIAGNOSIS — F19959 Other psychoactive substance use, unspecified with psychoactive substance-induced psychotic disorder, unspecified: Secondary | ICD-10-CM

## 2021-01-01 DIAGNOSIS — F17210 Nicotine dependence, cigarettes, uncomplicated: Secondary | ICD-10-CM | POA: Insufficient documentation

## 2021-01-01 DIAGNOSIS — F1190 Opioid use, unspecified, uncomplicated: Secondary | ICD-10-CM | POA: Insufficient documentation

## 2021-01-01 DIAGNOSIS — F1290 Cannabis use, unspecified, uncomplicated: Secondary | ICD-10-CM | POA: Insufficient documentation

## 2021-01-01 DIAGNOSIS — F209 Schizophrenia, unspecified: Secondary | ICD-10-CM | POA: Insufficient documentation

## 2021-01-01 DIAGNOSIS — F29 Unspecified psychosis not due to a substance or known physiological condition: Secondary | ICD-10-CM

## 2021-01-01 DIAGNOSIS — Z20822 Contact with and (suspected) exposure to covid-19: Secondary | ICD-10-CM | POA: Insufficient documentation

## 2021-01-01 LAB — CBC WITH DIFFERENTIAL/PLATELET
Abs Immature Granulocytes: 0.06 10*3/uL (ref 0.00–0.07)
Basophils Absolute: 0.1 10*3/uL (ref 0.0–0.1)
Basophils Relative: 1 %
Eosinophils Absolute: 0.1 10*3/uL (ref 0.0–0.5)
Eosinophils Relative: 1 %
HCT: 45.8 % (ref 39.0–52.0)
Hemoglobin: 14.4 g/dL (ref 13.0–17.0)
Immature Granulocytes: 1 %
Lymphocytes Relative: 24 %
Lymphs Abs: 2.6 10*3/uL (ref 0.7–4.0)
MCH: 23.6 pg — ABNORMAL LOW (ref 26.0–34.0)
MCHC: 31.4 g/dL (ref 30.0–36.0)
MCV: 75 fL — ABNORMAL LOW (ref 80.0–100.0)
Monocytes Absolute: 0.7 10*3/uL (ref 0.1–1.0)
Monocytes Relative: 6 %
Neutro Abs: 7.4 10*3/uL (ref 1.7–7.7)
Neutrophils Relative %: 67 %
Platelets: 330 10*3/uL (ref 150–400)
RBC: 6.11 MIL/uL — ABNORMAL HIGH (ref 4.22–5.81)
RDW: 13.2 % (ref 11.5–15.5)
WBC: 10.9 10*3/uL — ABNORMAL HIGH (ref 4.0–10.5)
nRBC: 0 % (ref 0.0–0.2)

## 2021-01-01 LAB — POCT URINE DRUG SCREEN - MANUAL ENTRY (I-SCREEN)
POC Amphetamine UR: NOT DETECTED
POC Buprenorphine (BUP): NOT DETECTED
POC Cocaine UR: NOT DETECTED
POC Marijuana UR: POSITIVE — AB
POC Methadone UR: NOT DETECTED
POC Methamphetamine UR: NOT DETECTED
POC Morphine: NOT DETECTED
POC Oxazepam (BZO): NOT DETECTED
POC Oxycodone UR: NOT DETECTED
POC Secobarbital (BAR): NOT DETECTED

## 2021-01-01 LAB — COMPREHENSIVE METABOLIC PANEL
ALT: 19 U/L (ref 0–44)
AST: 27 U/L (ref 15–41)
Albumin: 4.5 g/dL (ref 3.5–5.0)
Alkaline Phosphatase: 63 U/L (ref 38–126)
Anion gap: 11 (ref 5–15)
BUN: 9 mg/dL (ref 6–20)
CO2: 27 mmol/L (ref 22–32)
Calcium: 10.1 mg/dL (ref 8.9–10.3)
Chloride: 102 mmol/L (ref 98–111)
Creatinine, Ser: 1.01 mg/dL (ref 0.61–1.24)
GFR, Estimated: >60 mL/min (ref >60)
Glucose, Bld: 75 mg/dL (ref 70–99)
Potassium: 4.4 mmol/L (ref 3.5–5.1)
Sodium: 140 mmol/L (ref 135–145)
Total Bilirubin: 0.6 mg/dL (ref 0.3–1.2)
Total Protein: 6.7 g/dL (ref 6.5–8.1)

## 2021-01-01 LAB — RESP PANEL BY RT-PCR (FLU A&B, COVID) ARPGX2
Influenza A by PCR: NEGATIVE
Influenza B by PCR: NEGATIVE
SARS Coronavirus 2 by RT PCR: NEGATIVE

## 2021-01-01 LAB — LIPID PANEL
Cholesterol: 161 mg/dL (ref 0–200)
HDL: 59 mg/dL (ref >40)
LDL Cholesterol: 90 mg/dL (ref 0–99)
Total CHOL/HDL Ratio: 2.7 RATIO
Triglycerides: 60 mg/dL (ref <150)
VLDL: 12 mg/dL (ref 0–40)

## 2021-01-01 LAB — HEMOGLOBIN A1C
Hgb A1c MFr Bld: 4.4 % — ABNORMAL LOW (ref 4.8–5.6)
Mean Plasma Glucose: 79.58 mg/dL

## 2021-01-01 LAB — ETHANOL: Alcohol, Ethyl (B): 20 mg/dL — ABNORMAL HIGH (ref <10)

## 2021-01-01 LAB — POC SARS CORONAVIRUS 2 AG -  ED: SARS Coronavirus 2 Ag: NEGATIVE

## 2021-01-01 LAB — TSH: TSH: 0.604 u[IU]/mL (ref 0.350–4.500)

## 2021-01-01 MED ORDER — RISPERIDONE 1 MG PO TBDP: 1.0000 mg | ORAL_TABLET | Freq: Two times a day (BID) | ORAL | Status: DC | PRN

## 2021-01-01 MED ORDER — ACETAMINOPHEN 325 MG PO TABS: 650.0000 mg | ORAL_TABLET | Freq: Four times a day (QID) | ORAL | Status: DC | PRN

## 2021-01-01 MED ORDER — TRAZODONE HCL 50 MG PO TABS: 50.0000 mg | ORAL_TABLET | Freq: Every evening | ORAL | Status: DC | PRN

## 2021-01-01 MED ORDER — ALUM & MAG HYDROXIDE-SIMETH 200-200-20 MG/5ML PO SUSP: 30.0000 mL | ORAL | Status: DC | PRN

## 2021-01-01 MED ORDER — MAGNESIUM HYDROXIDE 400 MG/5ML PO SUSP: 30.0000 mL | Freq: Every day | ORAL | Status: DC | PRN

## 2021-01-01 MED ORDER — HYDROXYZINE HCL 25 MG PO TABS: 25.0000 mg | ORAL_TABLET | Freq: Three times a day (TID) | ORAL | Status: DC | PRN
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
## 2021-01-01 NOTE — ED Notes (Signed)
Oriented x 3 , affect blunted,  guarded and evasive in his verbalizations , not interacting with peers . Pt denies A/ H , V / H SI / HI or any thoughts of harming self or others at this time .Though guarded and evasive pt is calm , sitting on bed watching TV

## 2021-01-01 NOTE — Progress Notes (Signed)
Pt is admitted to continues OBS due to SI and HI. Pt is alert and oriented with pleasant affect. Pt is ambulatory and is oriented to staff/unit. Pt was cooperative with skin assessment. Pt denies pain and current SI/HI/AVH. Meal given. Staff will monitor for pt's safety.

## 2021-01-01 NOTE — ED Notes (Signed)
Pt sleeping at present, no distress noted, monitoring for safety. 
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
## 2021-01-02 MED ORDER — RISPERIDONE 1 MG PO TBDP: 1.0000 mg | ORAL_TABLET | Freq: Two times a day (BID) | ORAL | 0 refills | Status: DC | PRN

## 2021-01-02 NOTE — ED Notes (Signed)
Pt sitting in chair near pull out bed. No s&s of distress observed. Pleasant w/staff. Safety maintained and will continue to monitor.

## 2021-01-02 NOTE — ED Notes (Signed)
Patient A&O x 4, ambulatory. Patient discharged in no acute distress. Patient denied SI/HI, A/VH upon discharge. Patient verbalized understanding of all discharge instructions explained by staff, to include follow up appointments, RX's and safety plan. Pt belongings returned to patient from locker intact. Patient escorted to lobby via staff with bus pass. Safety maintained.
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
## 2021-01-02 NOTE — Discharge Instructions (Signed)
Take all medications as prescribed. Keep all follow-up appointments as scheduled.  Do not consume alcohol or use illegal drugs while on prescription medications. Report any adverse effects from your medications to your primary care provider promptly.  In the event of recurrent symptoms or worsening symptoms, call 911, a crisis hotline, or go to the nearest emergency department for evaluation.   

## 2021-01-02 NOTE — ED Notes (Signed)
Pt sleeping at present, no distress noted, monitoring for safety. 

## 2021-01-05 ENCOUNTER — Ambulatory Visit (HOSPITAL_COMMUNITY)
Admission: EM | Admit: 2021-01-05 | Discharge: 2021-01-05 | Disposition: A | Discharge status: Home / Self Care | Payer: Medicaid Other | Attending: Psychiatry | Admitting: Psychiatry

## 2021-01-05 DIAGNOSIS — R45851 Suicidal ideations: Secondary | ICD-10-CM | POA: Insufficient documentation

## 2021-01-05 DIAGNOSIS — F12959 Cannabis use, unspecified with psychotic disorder, unspecified: Secondary | ICD-10-CM | POA: Insufficient documentation

## 2021-01-05 DIAGNOSIS — F209 Schizophrenia, unspecified: Secondary | ICD-10-CM | POA: Insufficient documentation

## 2021-01-05 DIAGNOSIS — R45850 Homicidal ideations: Secondary | ICD-10-CM | POA: Insufficient documentation

## 2021-01-05 NOTE — Progress Notes (Signed)
   01/05/21 1545  [facility] Triage Screening (Walk-ins at [facility] only)  How Did You Hear About [place]? Family/Friend  What Is the Reason for Your Visit/Call Today? Pt is a 20 yo male who was brought in by [facility] after being IVC'd by his sister, Garrett Bolton. Per IVC, pt has drug induced paranoia and a hx of schizophrenia. Per IVC, pt is not eating, sleeping or tending to personal hygiene. Also, per IVC, pt is trying to engage in fights and stating he wants to kill himself and other family members. Per IVC, pt has been making non-sensical paranoid statements about people ."doing voodoo." Per IVC, per family, pt believes that the family dog is talking to him and "people are putting things in his mouth and body." Pt denied SI, HI, NSSH, AVH and paranoia. Pt admitted marijuana use frequently.  How Long Has This Been Causing You Problems? 1-6 months  Have You Recently Had Any Thoughts About Hurting Yourself? No  Are You Planning to Commit Suicide/Harm Yourself At This time? No  Have you Recently Had Thoughts About Hurting Someone [name]? No  Are You Planning To Harm Someone At This Time? No  Are you currently experiencing any auditory, visual or other hallucinations? No  Have You Used Any Alcohol or Drugs in the Past 24 Hours? Yes  What Did You Use and How Much? marijuana of unknown amount  Do you have any current medical co-morbidities that require immediate attention? No  Clinician description of patient physical appearance/behavior: Pt was casually dressed and appeared adequately groomed yet disheveled. Pt was increasingly agitated with questions asked and while talking to his mother on the phone. Pt's mood was angry/agitated and his affect appeared angry. Pt's speech and movement appeared within normal limits. Pt was oriented x 4.  What Do You Feel Would Help You the Most Today? Treatment for Depression or other mood problem  If access to [facility] was not available, would you have sought care in the Emergency  Department? Yes  Determination of Need Urgent (48 hours)  Garrett Bolton T. Jimmye Norman, MS, [facility], [facility] Triage Specialist [facility]

## 2021-01-05 NOTE — ED Notes (Signed)
Pt discharged in no acute distress. Safety maintained. Resources provided for follow up care.
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
## 2021-01-05 NOTE — Discharge Instructions (Addendum)

## 2021-01-06 ENCOUNTER — Telehealth (HOSPITAL_COMMUNITY): Payer: Self-pay | Admitting: Pediatrics

## 2021-01-06 NOTE — BH Assessment (Signed)
Care Management - Follow Up [facility] Discharges   Writer made contact with the patient.  Patient reports that he has already scheduled an appt with a provider for follow up care.

## 2021-01-11 ENCOUNTER — Ambulatory Visit (HOSPITAL_COMMUNITY)
Admission: EM | Admit: 2021-01-11 | Discharge: 2021-01-11 | Disposition: A | Discharge status: Other Acute Inpatient Hospital | Payer: Federal, State, Local not specified - PPO | Attending: Nurse Practitioner | Admitting: Nurse Practitioner

## 2021-01-11 ENCOUNTER — Inpatient Hospital Stay (HOSPITAL_COMMUNITY)
Admission: AD | Admit: 2021-01-11 | Discharge: 2021-01-18 | DRG: 885 | Disposition: A | Discharge status: Home / Self Care | Payer: Federal, State, Local not specified - PPO | Source: Intra-hospital | Attending: Emergency Medicine | Admitting: Emergency Medicine

## 2021-01-11 ENCOUNTER — Encounter (HOSPITAL_COMMUNITY): Payer: Self-pay | Admitting: Emergency Medicine

## 2021-01-11 DIAGNOSIS — Z79899 Other long term (current) drug therapy: Secondary | ICD-10-CM

## 2021-01-11 DIAGNOSIS — G2401 Drug induced subacute dyskinesia: Secondary | ICD-10-CM | POA: Diagnosis present

## 2021-01-11 DIAGNOSIS — F419 Anxiety disorder, unspecified: Secondary | ICD-10-CM | POA: Diagnosis present

## 2021-01-11 DIAGNOSIS — F203 Undifferentiated schizophrenia: Secondary | ICD-10-CM | POA: Diagnosis present

## 2021-01-11 DIAGNOSIS — F17210 Nicotine dependence, cigarettes, uncomplicated: Secondary | ICD-10-CM | POA: Diagnosis present

## 2021-01-11 DIAGNOSIS — F1290 Cannabis use, unspecified, uncomplicated: Secondary | ICD-10-CM | POA: Diagnosis present

## 2021-01-11 DIAGNOSIS — K5900 Constipation, unspecified: Secondary | ICD-10-CM | POA: Diagnosis present

## 2021-01-11 DIAGNOSIS — F2081 Schizophreniform disorder: Secondary | ICD-10-CM | POA: Insufficient documentation

## 2021-01-11 DIAGNOSIS — F209 Schizophrenia, unspecified: Secondary | ICD-10-CM | POA: Diagnosis present

## 2021-01-11 DIAGNOSIS — F22 Delusional disorders: Secondary | ICD-10-CM | POA: Diagnosis present

## 2021-01-11 DIAGNOSIS — K30 Functional dyspepsia: Secondary | ICD-10-CM | POA: Diagnosis present

## 2021-01-11 DIAGNOSIS — F17290 Nicotine dependence, other tobacco product, uncomplicated: Secondary | ICD-10-CM | POA: Diagnosis not present

## 2021-01-11 DIAGNOSIS — Z20822 Contact with and (suspected) exposure to covid-19: Secondary | ICD-10-CM | POA: Insufficient documentation

## 2021-01-11 DIAGNOSIS — F29 Unspecified psychosis not due to a substance or known physiological condition: Secondary | ICD-10-CM | POA: Diagnosis present

## 2021-01-11 LAB — POC SARS CORONAVIRUS 2 AG -  ED: SARS Coronavirus 2 Ag: NEGATIVE

## 2021-01-11 LAB — RESP PANEL BY RT-PCR (FLU A&B, COVID) ARPGX2
Influenza A by PCR: NEGATIVE
Influenza B by PCR: NEGATIVE
SARS Coronavirus 2 by RT PCR: NEGATIVE

## 2021-01-11 MED ORDER — TRAZODONE HCL 50 MG PO TABS: 50.0000 mg | ORAL_TABLET | Freq: Every evening | ORAL | Status: DC | PRN

## 2021-01-11 MED ORDER — MAGNESIUM HYDROXIDE 400 MG/5ML PO SUSP: 30.0000 mL | Freq: Every day | ORAL | Status: DC | PRN

## 2021-01-11 MED ORDER — HYDROXYZINE HCL 25 MG PO TABS: 25.0000 mg | ORAL_TABLET | Freq: Three times a day (TID) | ORAL | 0 refills | Status: DC | PRN

## 2021-01-11 MED ORDER — HYDROXYZINE HCL 25 MG PO TABS: 25.0000 mg | ORAL_TABLET | Freq: Three times a day (TID) | ORAL | Status: DC | PRN

## 2021-01-11 MED ORDER — RISPERIDONE 2 MG PO TBDP: 2.0000 mg | ORAL_TABLET | Freq: Every day | ORAL | Status: DC

## 2021-01-11 MED ORDER — LORAZEPAM 2 MG/ML IJ SOLN
2.0000 mg | Freq: Once | INTRAMUSCULAR | Status: AC
  Administered 2021-01-11: 2 mg via INTRAMUSCULAR
  Filled 2021-01-11: qty 1

## 2021-01-11 MED ORDER — ALUM & MAG HYDROXIDE-SIMETH 200-200-20 MG/5ML PO SUSP: 30.0000 mL | ORAL | Status: DC | PRN

## 2021-01-11 MED ORDER — RISPERIDONE 2 MG PO TBDP
2.0000 mg | ORAL_TABLET | Freq: Every day | ORAL | Status: DC
Start: 2021-01-11 — End: 2021-01-12
  Filled 2021-01-11 (×2): qty 1

## 2021-01-11 MED ORDER — TRAZODONE HCL 50 MG PO TABS
50.0000 mg | ORAL_TABLET | Freq: Every evening | ORAL | Status: DC | PRN
  Administered 2021-01-14 – 2021-01-17 (×2): 50 mg via ORAL
  Filled 2021-01-11 (×3): qty 1

## 2021-01-11 MED ORDER — HALOPERIDOL LACTATE 5 MG/ML IJ SOLN
5.0000 mg | Freq: Once | INTRAMUSCULAR | Status: AC
  Administered 2021-01-11: 5 mg via INTRAMUSCULAR
  Filled 2021-01-11: qty 1

## 2021-01-11 MED ORDER — RISPERIDONE 1 MG PO TBDP
1.0000 mg | ORAL_TABLET | Freq: Two times a day (BID) | ORAL | Status: DC | PRN
  Filled 2021-01-11: qty 1

## 2021-01-11 MED ORDER — RISPERIDONE 2 MG PO TBDP
2.0000 mg | ORAL_TABLET | Freq: Every day | ORAL | Status: DC
Start: 2021-01-11 — End: 2021-01-11
  Filled 2021-01-11: qty 1

## 2021-01-11 MED ORDER — ACETAMINOPHEN 325 MG PO TABS: 650.0000 mg | ORAL_TABLET | Freq: Four times a day (QID) | ORAL | Status: DC | PRN

## 2021-01-11 MED ORDER — RISPERIDONE 1 MG PO TBDP: 1.0000 mg | ORAL_TABLET | Freq: Two times a day (BID) | ORAL | Status: DC | PRN

## 2021-01-11 MED ORDER — HYDROXYZINE HCL 25 MG PO TABS
25.0000 mg | ORAL_TABLET | Freq: Three times a day (TID) | ORAL | Status: DC | PRN
Start: 2021-01-11 — End: 2021-01-12

## 2021-01-11 MED ORDER — DIPHENHYDRAMINE HCL 50 MG/ML IJ SOLN
50.0000 mg | Freq: Once | INTRAMUSCULAR | Status: AC
  Administered 2021-01-11: 50 mg via INTRAMUSCULAR
  Filled 2021-01-11: qty 1

## 2021-01-11 MED ORDER — RISPERIDONE 1 MG PO TABS: 1.0000 mg | ORAL_TABLET | Freq: Every day | ORAL | Status: DC
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
## 2021-01-11 NOTE — ED Notes (Signed)
Pt Admitted to [facility] due to paranoia. Patient was cooperative during the admission assessment. Skin assessment complete. Belongings inventoried. Patient oriented to unit and unit rules. Meal and drinks offered to patient.  Patient verbalized agreement to treatment plans. Patient verbally contracts for safety while hospitalized. Will monitor for safety.
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
## 2021-01-11 NOTE — ED Notes (Signed)
Patient refused temp
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
## 2021-01-11 NOTE — Progress Notes (Addendum)
Pt accepted to [facility] 507-2    Patient meets inpatient criteria per Nira Conn, NP    The attending provider will be Bartholomew Crews, MD    Call report to [phone]    Sharion Dove, RN @ [facility] notified.     Pt scheduled  to arrive at [facility] today by 1500.    Damita Dunnings, MSW, LCSW-A  1:28 PM 01/11/2021

## 2021-01-11 NOTE — ED Notes (Signed)
Pt just awakened in no acute distress. Denies SI/HI/AVH. Denies concerns. Refused Risperdal this am. Pt states, "the shot they gave me this morning was enough medicine. It's got me feeling tired. I'm good". Lunch provided. Informed pt to notify staff with any thoughts to harm self or others. Pt verbalized understanding. Safety maintained.

## 2021-01-11 NOTE — ED Notes (Signed)
Pt is awake yelling and stating that he is smelling blood in the atmosphere. Staff redirected pt. Patient is sitting on his bed quiet. No acute distress noted. Will continue to monitor for safety.

## 2021-01-11 NOTE — ED Notes (Signed)
Pt refused vitals 

## 2021-01-11 NOTE — ED Notes (Signed)
Pt laying in his bed calm and cooperative. No c/o of pain or distress. Will continue to monitor for safety

## 2021-01-11 NOTE — Progress Notes (Signed)
Pt arrived to [facility] inpatient adult unit.  Search completed.  15 minute checks initiated for patient safety.  Pt safe on unit.
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
## 2021-01-11 NOTE — ED Provider Notes (Signed)
FBC/OBS ASAP Discharge Summary  Date and Time: 01/11/2021 3:33 PM  Name: Garrett Bolton  MRN:  [id number]   Discharge Diagnoses:  Final diagnoses:  Schizophreniform disorder [facility])   Patient Accept[facility] to [facility] for inpatient psychiatric treatment     Past Medical History:  Past Medical History:  Diagnosis Date   Anemia    Sickle cell trait [facility])     Past Surgical History:  Procedure Laterality Date   CIRCUMCISION  2001   LAPAROTOMY N/A 08/23/2019   Procedure: EXPLORATORY LAPAROTOMY;  Surgeon: Violeta Gelinas, MD;  Location: [facility] OR;  Service: General;  Laterality: N/A;   Family History: No family history on file.  Social History:  Social History   Substance and Sexual Activity  Alcohol Use Yes     Social History   Substance and Sexual Activity  Drug Use Yes   Types: Methamphetamines, Marijuana   Comment: Daily    Social History   Socioeconomic History   Marital status: Single    Spouse name: Not on file   Number of children: Not on file   Years of education: Not on file   Highest education level: Not on file  Occupational History   Not on file  Tobacco Use   Smoking status: Some Days    Packs/day: 0.25    Years: 4.00    Pack years: 1.00    Types: Cigars, Cigarettes   Smokeless tobacco: Never  Vaping Use   Vaping Use: Never used  Substance and Sexual Activity   Alcohol use: Yes   Drug use: Yes    Types: Methamphetamines, Marijuana    Comment: Daily   Sexual activity: Never  Other Topics Concern   Not on file  Social History Narrative   Not on file   Social Determinants of Health   Financial Resource Strain: Not on file  Food Insecurity: Not on file  Transportation Needs: Not on file  Physical Activity: Not on file  Stress: Not on file  Social Connections: Not on file   SDOH:  SDOH Screenings   Alcohol Screen: Low Risk    Last Alcohol Screening Score (AUDIT): 1  Depression (PHQ2-9): Not on file  Financial Resource Strain: Not on  file  Food Insecurity: Not on file  Housing: Not on file  Physical Activity: Not on file  Social Connections: Not on file  Stress: Not on file  Tobacco Use: High Risk   Smoking Tobacco Use: Some Days   Smokeless Tobacco Use: Never   Passive Exposure: Not on file  Transportation Needs: Not on file  Current Medications:  Current Facility-Administered Medications  Medication Dose Route Frequency Provider Last Rate Last Admin   acetaminophen (TYLENOL) tablet 650 mg  650 mg Oral Q6H PRN Jackelyn Poling, NP       alum & mag hydroxide-simeth (MAALOX/MYLANTA) 200-200-20 MG/5ML suspension 30 mL  30 mL Oral Q4H PRN Nira Conn A, NP       hydrOXYzine (ATARAX/VISTARIL) tablet 25 mg  25 mg Oral TID PRN Nira Conn A, NP       magnesium hydroxide (MILK OF MAGNESIA) suspension 30 mL  30 mL Oral Daily PRN Nira Conn A, NP       risperiDONE (RISPERDAL M-TABS) disintegrating tablet 1 mg  1 mg Oral BID PRN Nira Conn A, NP       risperiDONE (RISPERDAL M-TABS) disintegrating tablet 2 mg  2 mg Oral Daily Nira Conn A, NP       risperiDONE (RISPERDAL  M-TABS) disintegrating tablet 2 mg  2 mg Oral QHS Nira Conn A, NP       traZODone (DESYREL) tablet 50 mg  50 mg Oral QHS PRN Jackelyn Poling, NP       No current outpatient medications on file.    PTA Medications: (Not in a hospital admission)   Psychiatric Specialty Exam  Presentation  General Appearance: Appropriate for Environment; Casual  Eye Contact:Good  Speech:Clear and Coherent; Normal Rate  Speech Volume:Normal  Handedness:Right   Mood and Affect  Mood:Euthymic  Affect:Blunt   Thought Process  Thought Processes:Coherent  Descriptions of Associations:Loose  Orientation:Full (Time, Place and Person)  Thought Content:Delusions; Paranoid Ideation  Diagnosis of Schizophrenia or Schizoaffective disorder in past: Yes  Duration of Psychotic Symptoms: Greater than six months   Hallucinations:No data recorded Ideas of  Reference:Delusions; Paranoia  Suicidal Thoughts:Suicidal Thoughts: No  Homicidal Thoughts:Homicidal Thoughts: No   Sensorium  Memory:Immediate Good; Recent Fair; Remote Fair  Judgment:Impaired  Insight:Lacking   Executive Functions  Concentration:Fair  Attention Span:Fair  Recall:Fair  Fund of Knowledge:Fair  Language:Good   Psychomotor Activity  Psychomotor Activity:Psychomotor Activity: Normal   Assets  Assets:Communication Skills; Desire for Improvement; Financial Resources/Insurance; Physical Health   Sleep  Sleep:Sleep: Fair   Nutritional Assessment (For OBS and FBC admissions only) Has the patient had a weight loss or gain of 10 pounds or more in the last 3 months?: No Has the patient had a decrease in food intake/or appetite?: No Does the patient have dental problems?: No Does the patient have eating habits or behaviors that may be indicators of an eating disorder including binging or inducing vomiting?: No Has the patient recently lost weight without trying?: 0 Has the patient been eating poorly because of a decreased appetite?: 0 Malnutrition Screening Tool Score: 0    Physical Exam  Physical Exam ROS Blood pressure 128/72, pulse 74, temperature 98.2 F (36.8 C), resp. rate 18, SpO2 100 %. There is no height or weight on file to calculate BMI. Disposition: Patient to be transferred to [facility] for inpatient psychiatric treatment  Assunta Found, NP 01/11/2021, 3:33 PM
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
## 2021-01-11 NOTE — ED Notes (Signed)
Pt is up with sock wraps around yelling at another pt pt was redirected

## 2021-01-11 NOTE — ED Notes (Signed)
Pt sleeping in no acute distress. RR even and unlabored. Safety maintained. 

## 2021-01-11 NOTE — ED Notes (Signed)
Pt refused medication
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
## 2021-01-11 NOTE — Progress Notes (Signed)
Adult Psychoeducational Group Note  Date:  01/11/2021 Time:  8:26 PM  Group Topic/Focus:  Wrap-Up Group:   The focus of this group is to help patients review their daily goal of treatment and discuss progress on daily workbooks.  Participation Level:  Did Not Attend  Participation Quality:   Did Not Attend  Affect:   Did Not Attend  Cognitive:   Did Not Attend  Insight: None  Engagement in Group:   Did Not Attend   Modes of Intervention:   Did Not Attend  Additional Comments:  Pt did not attend evening wrap up group tonight.  Felipa Furnace 01/11/2021, 8:26 PM

## 2021-01-12 ENCOUNTER — Encounter (HOSPITAL_COMMUNITY): Payer: Self-pay

## 2021-01-12 ENCOUNTER — Telehealth (HOSPITAL_COMMUNITY): Payer: Self-pay | Admitting: Pediatrics

## 2021-01-12 DIAGNOSIS — F29 Unspecified psychosis not due to a substance or known physiological condition: Secondary | ICD-10-CM

## 2021-01-12 MED ORDER — LORAZEPAM 1 MG PO TABS: 1.0000 mg | ORAL_TABLET | Freq: Four times a day (QID) | ORAL | Status: AC | PRN

## 2021-01-12 MED ORDER — BENZTROPINE MESYLATE 1 MG PO TABS: 1.0000 mg | ORAL_TABLET | Freq: Two times a day (BID) | ORAL | Status: DC | PRN

## 2021-01-12 MED ORDER — RISPERIDONE 1 MG PO TBDP
ORAL_TABLET | ORAL | Status: AC
  Filled 2021-01-12: qty 1

## 2021-01-12 MED ORDER — ADULT MULTIVITAMIN W/MINERALS CH
1.0000 | ORAL_TABLET | Freq: Every day | ORAL | Status: DC
  Administered 2021-01-12 – 2021-01-18 (×7): 1 via ORAL
  Filled 2021-01-12 (×11): qty 1

## 2021-01-12 MED ORDER — RISPERIDONE 1 MG PO TBDP
1.0000 mg | ORAL_TABLET | Freq: Two times a day (BID) | ORAL | Status: DC
  Administered 2021-01-12 (×2): 1 mg via ORAL
  Filled 2021-01-12 (×5): qty 1

## 2021-01-12 MED ORDER — LORAZEPAM 1 MG PO TABS: 1.0000 mg | ORAL_TABLET | Freq: Four times a day (QID) | ORAL | Status: DC | PRN

## 2021-01-12 MED ORDER — ONDANSETRON 4 MG PO TBDP: 4.0000 mg | ORAL_TABLET | Freq: Four times a day (QID) | ORAL | Status: AC | PRN

## 2021-01-12 MED ORDER — ZIPRASIDONE MESYLATE 20 MG IM SOLR: 20.0000 mg | INTRAMUSCULAR | Status: DC | PRN

## 2021-01-12 MED ORDER — LORAZEPAM 1 MG PO TABS
1.0000 mg | ORAL_TABLET | ORAL | Status: AC | PRN
  Administered 2021-01-12: 1 mg via ORAL
  Filled 2021-01-12: qty 1

## 2021-01-12 MED ORDER — HYDROXYZINE HCL 25 MG PO TABS
25.0000 mg | ORAL_TABLET | Freq: Four times a day (QID) | ORAL | Status: AC | PRN
  Administered 2021-01-12: 25 mg via ORAL
  Filled 2021-01-12: qty 1

## 2021-01-12 MED ORDER — THIAMINE HCL 100 MG PO TABS
100.0000 mg | ORAL_TABLET | Freq: Every day | ORAL | Status: DC
  Administered 2021-01-13 – 2021-01-18 (×6): 100 mg via ORAL
  Filled 2021-01-12 (×8): qty 1

## 2021-01-12 MED ORDER — RISPERIDONE 2 MG PO TBDP: 2.0000 mg | ORAL_TABLET | Freq: Three times a day (TID) | ORAL | Status: DC | PRN

## 2021-01-12 MED ORDER — LOPERAMIDE HCL 2 MG PO CAPS
2.0000 mg | ORAL_CAPSULE | ORAL | Status: AC | PRN
Start: 2021-01-12 — End: 2021-01-15

## 2021-01-12 MED ORDER — BENZTROPINE MESYLATE 1 MG/ML IJ SOLN: 1.0000 mg | Freq: Two times a day (BID) | INTRAMUSCULAR | Status: DC | PRN

## 2021-01-12 MED ORDER — NICOTINE 14 MG/24HR TD PT24
14.0000 mg | MEDICATED_PATCH | Freq: Every day | TRANSDERMAL | Status: DC
  Administered 2021-01-13: 14 mg via TRANSDERMAL
  Filled 2021-01-12 (×4): qty 1

## 2021-01-12 MED ORDER — RISPERIDONE 2 MG PO TBDP
2.0000 mg | ORAL_TABLET | Freq: Two times a day (BID) | ORAL | Status: DC
  Administered 2021-01-13 – 2021-01-18 (×11): 2 mg via ORAL
  Filled 2021-01-12 (×15): qty 1

## 2021-01-12 NOTE — BHH Counselor (Signed)
CSW attempted to complete patients PSA, however pt was sleeping and would not wake up.      Ruthann Cancer MSW, LCSW Clincal Social Worker  [facility]

## 2021-01-12 NOTE — B[facility] Group Notes (Signed)
Adult Psychoeducational Group Note  Date:  01/12/2021 Time:  9:48 AM  Group Topic/Focus:  Goals Group:   The focus of this group is to help patients establish daily goals to achieve during treatment and discuss how the patient can incorporate goal setting into their daily lives to aide in recovery.  Participation Level:  Did Not Attend    Donell Beers 01/12/2021, 9:48 AM

## 2021-01-12 NOTE — B[facility] Group Notes (Signed)
B[facility] Group Notes:  (Nursing/MHT/Case Management/Adjunct)  Date:  01/12/2021  Time:  5:16 PM  Type of Therapy:   Therapeutic Relaxation group  Participation Level:  Minimal  Participation Quality:  Appropriate  Affect:  Appropriate  Cognitive:  Appropriate  Insight:  Appropriate  Engagement in Group:  Engaged and Improving  Modes of Intervention:  Activity, Education, and Exploration  Summary of Progress/Problems: Group consisted of some deep breathing techniques along with positive visualization exercises. These things help with stress and anxiety and works well with coping skills.  Neah Sporrer J Mechele Kittleson 01/12/2021, 5:16 PM

## 2021-01-12 NOTE — Progress Notes (Signed)
   01/12/21 1930  Psych Admission Type (Psych Patients Only)  Admission Status Involuntary  Psychosocial Assessment  Patient Complaints None  Eye Contact Brief  Facial Expression Flat  Affect Flat  Speech Soft;Tangential  Interaction Hypervigilant;Guarded  Motor Activity Other (Comment) (WDL)  Appearance/Hygiene Unremarkable  Behavior Characteristics Cooperative;Appropriate to situation  Mood Preoccupied  Thought Process  Coherency Incoherent  Content WDL  Delusions None reported or observed  Perception Hallucinations  Hallucination Tactile  Judgment Impaired  Confusion Mild  Danger to Self  Current suicidal ideation? Denies  Danger to Others  Danger to Others None reported or observed

## 2021-01-12 NOTE — BH IP Treatment Plan (Signed)
Interdisciplinary Treatment and Diagnostic Plan Update  01/12/2021 Time of Session: 10:05am  Garrett Bolton MRN: [id number]  Principal Diagnosis: Schizophrenia spectrum disorder with psychotic disorder type not yet determined [facility])  Secondary Diagnoses: Principal Problem:   Schizophrenia spectrum disorder with psychotic disorder type not yet determined ([place]) Active Problems:   Episodic cannabis use   Current Medications:  Current Facility-Administered Medications  Medication Dose Route Frequency Provider Last Rate Last Admin   acetaminophen (TYLENOL) tablet 650 mg  650 mg Oral Q6H PRN Nwoko, Uchenna E, PA       alum & mag hydroxide-simeth (MAALOX/MYLANTA) 200-200-20 MG/5ML suspension 30 mL  30 mL Oral Q4H PRN Nwoko, Uchenna E, PA       benztropine (COGENTIN) tablet 1 mg  1 mg Oral BID PRN Harlow Asa, MD       benztropine mesylate (COGENTIN) injection 1 mg  1 mg Intramuscular BID PRN Harlow Asa, MD       hydrOXYzine (ATARAX/VISTARIL) tablet 25 mg  25 mg Oral Q6H PRN Nelda Marseille, Amy E, MD       loperamide (IMODIUM) capsule 2-4 mg  2-4 mg Oral PRN Harlow Asa, MD       LORazepam (ATIVAN) tablet 1 mg  1 mg Oral Q6H PRN Nelda Marseille, Amy E, MD       magnesium hydroxide (MILK OF MAGNESIA) suspension 30 mL  30 mL Oral Daily PRN Nwoko, Uchenna E, PA       multivitamin with minerals tablet 1 tablet  1 tablet Oral Daily Nelda Marseille, Amy E, MD   1 tablet at 01/12/21 1123   nicotine (NICODERM CQ - dosed in mg/24 hours) patch 14 mg  14 mg Transdermal Daily Nwoko, Uchenna E, PA       ondansetron (ZOFRAN-ODT) disintegrating tablet 4 mg  4 mg Oral Q6H PRN Nelda Marseille, Amy E, MD       risperiDONE (RISPERDAL M-TABS) disintegrating tablet 1 mg  1 mg Oral BID Nelda Marseille, Amy E, MD   1 mg at 01/12/21 1118   risperiDONE (RISPERDAL M-TABS) disintegrating tablet 2 mg  2 mg Oral Q8H PRN France Ravens, MD       And   ziprasidone (GEODON) injection 20 mg  20 mg Intramuscular PRN France Ravens, MD        Derrill Memo ON 01/13/2021] thiamine tablet 100 mg  100 mg Oral Daily Nelda Marseille, Amy E, MD       traZODone (DESYREL) tablet 50 mg  50 mg Oral QHS PRN Nwoko, Uchenna E, PA       PTA Medications: Medications Prior to Admission  Medication Sig Dispense Refill Last Dose   hydrOXYzine (ATARAX/VISTARIL) 25 MG tablet Take 1 tablet (25 mg total) by mouth 3 (three) times daily as needed for anxiety. 30 tablet 0    risperiDONE (RISPERDAL M-TABS) 1 MG disintegrating tablet Take 1 tablet (1 mg total) by mouth 2 (two) times daily as needed (psychosis and agitated).      risperiDONE (RISPERDAL M-TABS) 2 MG disintegrating tablet Take 1 tablet (2 mg total) by mouth at bedtime.      traZODone (DESYREL) 50 MG tablet Take 1 tablet (50 mg total) by mouth at bedtime as needed for sleep.       Patient Stressors:    Patient Strengths:    Treatment Modalities: Medication Management, Group therapy, Case management,  1 to 1 session with clinician, Psychoeducation, Recreational therapy.   Physician Treatment Plan for Primary Diagnosis: Schizophrenia spectrum disorder with psychotic disorder type not  yet determined ([place]) Long Term Goal(s):     Short Term Goals:    Medication Management: Evaluate patient's response, side effects, and tolerance of medication regimen.  Therapeutic Interventions: 1 to 1 sessions, Unit Group sessions and Medication administration.  Evaluation of Outcomes: Not Met  Physician Treatment Plan for Secondary Diagnosis: Principal Problem:   Schizophrenia spectrum disorder with psychotic disorder type not yet determined ([place]) Active Problems:   Episodic cannabis use  Long Term Goal(s):     Short Term Goals:       Medication Management: Evaluate patient's response, side effects, and tolerance of medication regimen.  Therapeutic Interventions: 1 to 1 sessions, Unit Group sessions and Medication administration.  Evaluation of Outcomes: Not Met   RN Treatment Plan for Primary Diagnosis:  Schizophrenia spectrum disorder with psychotic disorder type not yet determined ([place]) Long Term Goal(s): Knowledge of disease and therapeutic regimen to maintain health will improve  Short Term Goals: Ability to remain free from injury will improve, Ability to demonstrate self-control, Ability to participate in decision making will improve, Ability to verbalize feelings will improve, Ability to disclose and discuss suicidal ideas, and Ability to identify and develop effective coping behaviors will improve  Medication Management: RN will administer medications as ordered by provider, will assess and evaluate patient's response and provide education to patient for prescribed medication. RN will report any adverse and/or side effects to prescribing provider.  Therapeutic Interventions: 1 on 1 counseling sessions, Psychoeducation, Medication administration, Evaluate responses to treatment, Monitor vital signs and CBGs as ordered, Perform/monitor CIWA, COWS, AIMS and Fall Risk screenings as ordered, Perform wound care treatments as ordered.  Evaluation of Outcomes: Not Met   LCSW Treatment Plan for Primary Diagnosis: Schizophrenia spectrum disorder with psychotic disorder type not yet determined ([place]) Long Term Goal(s): Safe transition to appropriate next level of care at discharge, Engage patient in therapeutic group addressing interpersonal concerns.  Short Term Goals: Engage patient in aftercare planning with referrals and resources, Increase social support, Increase ability to appropriately verbalize feelings, Increase emotional regulation, Facilitate acceptance of mental health diagnosis and concerns, Identify triggers associated with mental health/substance abuse issues, and Increase skills for wellness and recovery  Therapeutic Interventions: Assess for all discharge needs, 1 to 1 time with Social worker, Explore available resources and support systems, Assess for adequacy in community support  network, Educate family and significant other(s) on suicide prevention, Complete Psychosocial Assessment, Interpersonal group therapy.  Evaluation of Outcomes: Not Met   Progress in Treatment: Attending groups: Yes. Participating in groups: Yes. Taking medication as prescribed: Yes. Toleration medication: Yes. Family/Significant other contact made: Yes, individual(s) contacted:  If consents are provided  Patient understands diagnosis: No. Discussing patient identified problems/goals with staff: Yes. Medical problems stabilized or resolved: Yes. Denies suicidal/homicidal ideation: Yes. Issues/concerns per patient self-inventory: No.   New problem(s) identified: No, Describe:  None   New Short Term/Long Term Goal(s): medication stabilization, elimination of SI thoughts, development of comprehensive mental wellness plan.   Patient Goals: "To go to a shelter in [facility]"   Discharge Plan or Barriers: Patient recently admitted. CSW will continue to follow and assess for appropriate referrals and possible discharge planning.   Reason for Continuation of Hospitalization: Aggression Anxiety Hallucinations Medication stabilization  Estimated Length of Stay: 3 to 5 days    Scribe for Treatment Team: Carney Harder 01/12/2021 3:34 PM

## 2021-01-12 NOTE — Group Note (Signed)
LCSW Group Therapy Note  Group Date: 01/12/2021 Start Time: 1300 End Time: 1400   Type of Therapy and Topic:  Group Therapy: Positive Affirmations  Participation Level:  Did Not Attend   Description of Group:   This group addressed positive affirmation towards self and others.  Patients went around the room and identified two positive things about themselves and two positive things about a peer in the room.  Patients reflected on how it felt to share something positive with others, to identify positive things about themselves, and to hear positive things from others/ Patients were encouraged to have a daily reflection of positive characteristics or circumstances.   Therapeutic Goals: Patients will verbalize two of their positive qualities Patients will demonstrate empathy for others by stating two positive qualities about a peer in the group Patients will verbalize their feelings when voicing positive self affirmations and when voicing positive affirmations of others Patients will discuss the potential positive impact on their wellness/recovery of focusing on positive traits of self and others.  Summary of Patient Progress:  Patient came to beginning of group, however left before patients began sharing.   Therapeutic Modalities:   Cognitive Behavioral Therapy Motivational Interviewing    Otelia Santee, LCSW 01/12/2021  2:33 PM
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
## 2021-01-12 NOTE — Group Note (Signed)
Recreation Therapy Group Note   Group Topic:Problem Solving  Group Date: 01/12/2021 Start Time: 1000 End Time: 1045 Facilitators: Caroll Rancher, LRT/CTRS Location: 500 Hall Dayroom   Goal Area(s) Addresses:  Patient will effectively work with peer towards shared goal.  Patient will identify skills used to make activity successful.  Patient will identify how skills used during activity can be used to reach post d/c goals.    Group Description: Each patient was given a poly spot plus one extra for the whole group and LRT used cones to create an obstacle course for patients to maneuver through.  Patients were to use the poly spots to get the group from one end of the hall to the other.  If anyone were to step off of their spot, the group had to start over. LRT and patients debriefed on the importance of patience, communication, paying attention, asking peers for help, and problem solving   Affect/Mood: N/A   Participation Level: Did not attend    Clinical Observations/Individualized Feedback: Pt did not attend group.    Plan: Continue to engage patient in RT group sessions 2-3x/week.   Caroll Rancher, LRT/CTRS 01/12/2021 1:11 PM

## 2021-01-12 NOTE — [facility] Group Notes (Signed)
Pt didn't attend group. 

## 2021-01-12 NOTE — Progress Notes (Signed)
Recreation Therapy Notes  INPATIENT RECREATION THERAPY ASSESSMENT  Patient Details Name: Garrett Bolton MRN: [id number] DOB: 04-Feb-2001 Today's Date: 01/12/2021       Information Obtained From: Patient  Able to Participate in Assessment/Interview: Yes  Patient Presentation: Alert (Calm, Soft spoken)  Reason for Admission (Per Patient): Other (Comments) (Pt stated he called to get better placement)  Patient Stressors: Other (Comment) ("people I'm around")  Coping Skills:   Journal, Sports, Exercise, Music, Deep Breathing, Talk, Avoidance  Leisure Interests (2+):  Games - Video games, Sports - Other (Comment) (Soccer)  Frequency of Recreation/Participation: Other (Comment) (Daily)  Awareness of Community Resources:  Yes  Community Resources:  Engineering geologist, Public affairs consultant  Current Use: Yes  If no, Barriers?:    Expressed Interest in [employer] Information: No  [employer] of Residence:  Engineer, technical sales  Patient Main Form of Transportation: Therapist, music  Patient Strengths:  Communication, Understanding  Patient Identified Areas of Improvement:  Not falling victim to the same accusations  Patient Goal for Hospitalization:  "find placement in [place]"  Current SI (including self-harm):  No  Current HI:  No  Current AVH: No  Staff Intervention Plan: Group Attendance, Collaborate with Interdisciplinary Treatment Team  Consent to Intern Participation: N/A    Caroll Rancher, LRT/CTRS Lillia Abed, Samyah Bilbo A 01/12/2021, 1:40 PM

## 2021-01-12 NOTE — BH Assessment (Signed)
Care Management - Follow Up [facility] Discharges   Patient has been placed in an inpatient psychiatric hospital [facility]) on 01-11-21

## 2021-01-12 NOTE — Progress Notes (Addendum)
Pt noted in dayroom for groups and activities at intervals during shift. Observed to be animated but is suspicious, paranoid and delusional on interactions. Per pt "I'm not crazy, I'm in here because I'm trying to get away from the soul reapers out there". Pt observed with spontaneous running, very suspicious about taking his medications, required multiple redirections / prompts to take his scheduled medications "I don't want all these medications, I don't want to be a zombie". Pt received PRN Ativan and Vistaril for agitation ans anxiety as ordered evidenced by increased irritability, intense staring at others and pacing in milieu. Pt cooperative with care and verbal redirections when reassessed post PRN administration. Pt went off unit for meals and activities, returned without issues. Support and reassurance provided to pt. Q 15 minutes safety checks maintained on and off unit. Assigned staff in attendance at all times.  Pt tolerates scheduled medications well without discomfort. Pt is safe on unit. Pt reported he slept well with good appetite. Denies SI, HI, AVH and pain. However does appears to be responding to internal stimuli with inappropriate laughter.

## 2021-01-12 NOTE — BHH Suicide Risk Assessment (Addendum)
[facility] Admission Suicide Risk Assessment   Demographic factors:   male, young adult Current Mental Status:   delusions, agitation, paranoia Loss Factors:   NA Historical Factors:   previous psychiatric diagnoses/treatments, medication noncompliance, h/o substance abuse Risk Reduction Factors:   living with family member, employed  Total Time Spent in Direct Patient Care:  I personally spent 45 minutes on the unit in direct patient care. The direct patient care time included face-to-face time with the patient, reviewing the patient's chart, communicating with other professionals, and coordinating care. Greater than 50% of this time was spent in counseling or coordinating care with the patient regarding goals of hospitalization, psycho-education, and discharge planning needs.  Principal Problem: Schizophrenia spectrum disorder with psychotic disorder type not yet determined (HCC) Diagnosis:  Principal Problem:   Schizophrenia spectrum disorder with psychotic disorder type not yet determined (HCC) Active Problems:   Episodic cannabis use  Subjective Data: The patient is a 20y/o male with h/o substance induced psychosis and unspecified schizophrenia spectrum and other psychotic d/o, who was admitted under IVC from [facility] for management of paranoia and delusions. He had originally been petitioned by his sister but states "I called the police myself, because I needed to get away from my sister and her boyfriend." He reports that he had been living with family but felt the home was stressful and perceives that his sister and her boyfriend want to keep him in the hospital or "keep me down." He feels that his sister is "telling lies" about him and accusing him of not taking care of his hygiene. He reportedly was making statements prior to admission that people were practicing voodoo on him, people are trying to take over his body, and that spirits were sexually assaulting him and placing things in his anus. On  exam today he does not make these delusional statements but admits he is paranoid about "people in the community" and about his family. He denies AVH, ideas of reference or first rank symptoms. He does not know his psychiatric diagnosis but knows he has been admitted in the past multiple times. He denies SI or HI. He admits that after his last hospital discharge he did not comply with psychotropic medications. He knows he previously took Risperdal but otherwise cannot report previous medication trials. He states he last used THC 1 month ago and denies other illicit drug, IVD, or alcohol use.   Per records review, he was last inpatient at [facility] in December 2021 for paranoia and delusions at which time he was discharged on Invega sustenna and Risperdal 4mg  qhs and 2mg  qd. During that inpatient admission he had acute EPS symptoms with Haldol use and therefore transitioned to Risperdal and Invega. According to his notes, he has previously been tried on Zyprexa and VPA additionally in the past. See H&P for additional details.  CLINICAL FACTORS:   More than one psychiatric diagnosis Currently Psychotic Previous Psychiatric Diagnoses and Treatments   Musculoskeletal: Strength & Muscle Tone: within normal limits Gait & Station: normal Patient leans: N/A  Psychiatric Specialty Exam: Physical Exam Vitals and nursing note reviewed.  Constitutional:      Appearance: Normal appearance.  HENT:     Head: Normocephalic.  Pulmonary:     Effort: Pulmonary effort is normal.  Neurological:     General: No focal deficit present.     Mental Status: He is alert.    Review of Systems  Constitutional:  Negative for fever.  HENT:  Negative for congestion.  Eyes:  Negative for visual disturbance.  Respiratory:  Negative for cough.   Cardiovascular:  Negative for chest pain.  Gastrointestinal:  Negative for diarrhea, nausea and vomiting.  Genitourinary:  Negative for difficulty urinating.  Skin:  Negative for  rash.  Neurological:  Negative for headaches.    There were no vitals taken for this visit.There is no height or weight on file to calculate BMI.  General Appearance:  casually dressed, fair hygiene but wearing coat on the unit and sweat pants pulled up to knees  Eye Contact:  Fair but frequently looks about room  Speech:  Clear and Coherent and Normal Rate  Volume:  Normal  Mood:  Anxious and suspicious  Affect:  Constricted and guarded  Thought Process:  ruminative about discharge- mostly linear during interview with occasional tangential organization  Orientation:  Full (Time, Place, and Person)  Thought Content:   appears paranoid and guarded on exam based on body language and eyes darting about examiners but does not make delusional statements; admits to paranoia about family and community members; denies AVH, ideas of reference or first rank symptoms  Suicidal Thoughts:  No  Homicidal Thoughts:  No  Memory:  Recent;   Fair  Judgement:  Impaired  Insight:  Lacking  Psychomotor Activity:  Normal  Concentration:  Concentration: Fair and Attention Span: Fair  Recall:  [employer] of Knowledge:  Fair  Language:  Good  Akathisia:  Negative  Assets:  Communication Skills Desire for Improvement Resilience Social Support  ADL's:  Intact  Cognition:  WNL  Sleep:  Number of Hours: 4.5   COGNITIVE FEATURES THAT CONTRIBUTE TO RISK:  Closed-mindedness    SUICIDE RISK:  Moderate given h/o past aggression, recent delusions and paranoia, and previous psychiatric treatments/diagnoses.   PLAN OF CARE: Patient admitted under IVC and House Officer to complete 2nd opinion. Labs from 01/01/21: WBC 10.9, H/H 14.4/45.8 and platelets 330; CMP WNL, ETOH 20, A1c 4.4, lipid panel WNL, TSH 0.604, UDS positive for THC; EKG shows NSR rightward axis 69bpm with QTC . Will repeat CBC and CMP for this admission and repeat UDS pending.  Respiratory panel negative and SARs negative.  Discussed with  patient and he is agreeable to take po Risperdal. Advised that given his h/o aggression and paranoia that if he refuses po medications he may require forced medication over objection protocol. Will start Risperdal 1mg  M tabs bid titrating up as tolerated. Will order PRN Cogentin po and IM for acute EPS/dystonia given past h/o reaction to antipsychotics. Will discuss transition back to [facility] during admission. Will place patient on CIWA given positive ETOH screen on 01/01/21 for monitoring.   I certify that inpatient services furnished can reasonably be expected to improve the patient's condition.   01/03/21, MD, FAPA 01/12/2021, 11:10 AM

## 2021-01-13 ENCOUNTER — Other Ambulatory Visit: Payer: Self-pay

## 2021-01-13 DIAGNOSIS — F29 Unspecified psychosis not due to a substance or known physiological condition: Secondary | ICD-10-CM | POA: Diagnosis not present

## 2021-01-13 MED ORDER — ENSURE ENLIVE PO LIQD
237.0000 mL | Freq: Two times a day (BID) | ORAL | Status: DC
  Administered 2021-01-14 – 2021-01-17 (×5): 237 mL via ORAL
  Filled 2021-01-13 (×13): qty 237

## 2021-01-13 MED ORDER — NICOTINE 21 MG/24HR TD PT24
21.0000 mg | MEDICATED_PATCH | Freq: Every day | TRANSDERMAL | Status: DC
  Administered 2021-01-15: 21 mg via TRANSDERMAL
  Filled 2021-01-13 (×7): qty 1

## 2021-01-13 NOTE — Progress Notes (Signed)
Progress note    01/13/21 0741  Psych Admission Type (Psych Patients Only)  Admission Status Involuntary  Psychosocial Assessment  Patient Complaints Anxiety  Eye Contact Fair  Facial Expression Anxious;Pensive  Affect Anxious;Blunted  Speech Logical/coherent  Interaction Assertive  Motor Activity Slow  Appearance/Hygiene Unremarkable  Behavior Characteristics Cooperative;Appropriate to situation;Anxious  Mood Anxious;Pleasant  Thought Process  Coherency Concrete thinking  Content Blaming others  Delusions Controlled  Perception WDL  Hallucination None reported or observed  Judgment Poor  Confusion None  Danger to Self  Current suicidal ideation? Denies  Danger to Others  Danger to Others None reported or observed

## 2021-01-13 NOTE — Group Note (Signed)
Occupational Therapy Group Note  Group Topic:Coping Skills  Group Date: 01/13/2021 Start Time: 1400 End Time: 1440 Facilitators: Donne Hazel, OT/L   Group Description: Group encouraged increased engagement and participation through discussion and activity focused on "Coping Ahead." Patients were split up into teams and selected a card from a stack of positive coping strategies. Patients were instructed to act out/charade the coping skill for other peers to guess and receive points for their team. Discussion followed with a focus on identifying additional positive coping strategies and patients shared how they were going to cope ahead over the weekend while continuing hospitalization stay.  Therapeutic Goal(s): Identify positive vs negative coping strategies. Identify coping skills to be used during hospitalization vs coping skills outside of hospital/at home Increase participation in therapeutic group environment and promote engagement in treatment   Participation Level: Active   Participation Quality: Independent   Behavior: Cooperative   Speech/Thought Process: Disorganized   Affect/Mood: Full range   Insight: Limited   Judgement: Limited   Individualization: Garrett Bolton was active in their participation of group discussion/activity. Pt identified benefit of music as a coping skill "It helps take my mind off of things so I don't get angry." Pt engaged appropriately in activity, although distracted and disorganized in his conversation with peer. Shared an appropriate song with the group to listen to by The Temptations.   Modes of Intervention: Activity, Discussion, and Education  Patient Response to Interventions:  Attentive, Engaged, and Interested    Plan: Continue to engage patient in OT groups 2 - 3x/week.  01/13/2021  Donne Hazel, OT/L

## 2021-01-13 NOTE — B[facility] Group Notes (Signed)
Adult Psychoeducational Group Note  Date:  01/13/2021 Time:  4:28 PM  Group Topic/Focus:  Wellness Toolbox:   The focus of this group is to discuss various aspects of wellness, balancing those aspects and exploring ways to increase the ability to experience wellness.  Patients will create a wellness toolbox for use upon discharge.  Participation Level:  Active  Participation Quality:  Appropriate  Affect:  Appropriate  Cognitive:  Appropriate  Insight: Appropriate  Engagement in Group:  Engaged  Modes of Intervention:  Discussion  Additional Comments:    Donell Beers 01/13/2021, 4:28 PM

## 2021-01-13 NOTE — Plan of Care (Signed)
Spoke with mother Zachory Mangual (mother) [phone].  Patient had given permission for this writer to call mom.  Mother states that she is very concerned regarding patient behavior as the reason for his admission (bizarre behavior, paranoia) is almost always when patient is using marijuana.  Mother is concerned that patient has been slipping out of sister's apartment to use marijuana.  1 patient has been living with mother, patient would attempt to slip out but mother was able to follow patient so that he would not use marijuana.  Patient had apparently "overdosed on marijuana" at least twice when he was staying with mother.  Discussed with mother that we can only go with what patient has been reporting and if he decides to use marijuana or other substances after discharge, that is his decision.  Discussed with mother that we would do our best to have him be medication compliant prior to discharge.  Mother verbalized understanding and is requesting that the healthcare team do their best to convince him to abstain from marijuana use as this is likely the predominant factor of his psychosis.

## 2021-01-13 NOTE — B[facility] Counselor (Signed)
Adult Comprehensive Assessment   Patient ID: Garrett Bolton, male   DOB: June 05, 2000, 20 y.o.   MRN: [id number]   Information Source: Information source: Patient   Current Stressors:  Patient states their primary concerns and needs for treatment are:: Patient states he was upset and he left his sisters house to go sit and cool dawn. He reports he was not mad a them, however when he returned to the house the door was locked and his sister was calling the police. 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 Denies stressor  Employment / Job issues: Yes, reports having some stress from his job. Currently works at [employer] and states they are not giving him enough hours Family Relationships: Yes, family doesn't seem to understand Financial / Lack of resources (include bankruptcy): Yes, due to not getting enough hours  Housing / Lack of housing: Yes, does not want to return to live with family due the bad energy Physical health (include injuries & life threatening diseases): "None" Social relationships: "Yes, thi people yelling stuff at me" Substance abuse: "No" Bereavement / Loss: Denies stressor   Living/Environment/Situation:  Living Arrangements: Other relatives Living conditions (as described by patient or guardian): Negative energy  Who else lives in the home?: Sister  and her boyfriend. How long has patient lived in current situation?: 3 years What is atmosphere in current home: Temporary   Family History:  Marital status: Single Are you sexually active?: No What is your sexual orientation?: "Females" Does patient have children?: No   Childhood History:  By whom was/is the patient raised?: Mother, Mother/father and step-parent, Sibling, Grandparents Additional childhood history information: Father sporadic involvement. Grandmother was involved  in upbringing. Lived with mother until last year when moving in with sister. Description of patient's relationship with caregiver when they were a child: "It was good, always loving" Patient's description of current relationship with people who raised him/her: "Still the same, just a little distant...they didn't like the stuff I was doing there, like smoking and stuff, so I got kicked out and went to sisters" How were you disciplined when you got in trouble as a child/adolescent?: "Whooped...regular, nothing too crazy, nothing abusive" Does patient have siblings?: Yes Number of Siblings: 1 Description of patient's current relationship with siblings: "It's loving" Did patient suffer any verbal/emotional/physical/sexual abuse as a child?: No Did patient suffer from severe childhood neglect?: No Has patient ever been sexually abused/assaulted/raped as an adolescent or adult?: No Was the patient ever a victim of a crime or a disaster?: No Witnessed domestic violence?: No Has patient been effected by domestic violence as an adult?: No   Education:  Highest grade of school patient has completed: Graduated high school  Currently a student?: No Learning disability?: No   Employment/Work Situation:   Employment situation: Employed 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 Are There Guns or Other Weapons in Your Home?: No   Financial Resources:   Financial resources: Income from employment, Private insurance Does patient have a representative  payee or guardian?: No     Alcohol/Substance Abuse:   What has been your use  of drugs/alcohol within the last 12 months?: Patient states he stopped smoking THC one month ago. 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   Strengths/Needs:   What is the patient's perception of their strengths?: "Communicating, listening, helping others" Patient states they can use these personal strengths during their treatment to contribute to their recovery: "With me being honest and telling it how it is, they could help me better to deal with what's going on" Patient states these barriers may affect/interfere with their treatment: "No" Patient states these barriers may affect their return to the community: ""No"   Discharge Plan:   Currently receiving community mental health services: No Patient states concerns and preferences for aftercare planning are: Patient is interested in ACTT services at discharge  Patient states they will know when they are safe and ready for discharge when: Yes, now Does patient have access to transportation?: No Does patient have financial barriers related to discharge medications?: No Patient description of barriers related to discharge medications: n/a Will patient be returning to same living situation after discharge?: No, will be staying in a shelter   Summary/Recommendations:   Summary and Recommendations (to be completed by the evaluator): Garrett Bolton was admitted due to paranoia. Pt has a hx of schizophrenia. Recent stressors include limited income, family relationship issues, unstable housing, lack of resources  and support.. Pt currently sees no outpatient providers. While here, Garrett Bolton can benefit from crisis stabilization, medication management, therapeutic milieu, and referrals for services.

## 2021-01-13 NOTE — Plan of Care (Signed)
  Problem: Education: Goal: Knowledge of [facility] Education information/materials will improve Outcome: Progressing Goal: Emotional status will improve Outcome: Progressing Goal: Mental status will improve Outcome: Progressing   

## 2021-01-13 NOTE — Progress Notes (Signed)
   01/13/21 2005  Psych Admission Type (Psych Patients Only)  Admission Status Involuntary  Psychosocial Assessment  Patient Complaints None  Eye Contact Fair  Facial Expression Anxious;Pensive  Affect Anxious;Blunted  Speech Logical/coherent;Slow  Interaction Assertive  Motor Activity Slow  Appearance/Hygiene Unremarkable  Behavior Characteristics Cooperative;Appropriate to situation  Mood Preoccupied;Pleasant  Thought Process  Coherency Concrete thinking  Content Blaming others;WDL (says sister's boyfriend doesn't like him and keeps calling the police on him)  Delusions Controlled  Perception WDL  Hallucination None reported or observed  Judgment Limited  Confusion None  Danger to Self  Current suicidal ideation? Denies  Danger to Others  Danger to Others None reported or observed   Pt seen in hallway. Pt denies SI, HI,AVH and pain. Pt denies anxiety and depression. Pt says the Risperdal makes him sleepy if taken at the wrong time. When asked what time would be better, pt initially said taking it at night and then said it didn't matter. "I'm gonna be going to a good program soon." Pt did take his Risperdal this evening.  Pt also asked about blood work. "I refused giving blood. That won't keep me here will it? 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 That's why I refuse." Pt denies any other complaints.

## 2021-01-13 NOTE — Progress Notes (Signed)
Psychoeducational Group Note  Date:  01/13/2021 Time:  2137  Group Topic/Focus:  Wrap-Up Group:   The focus of this group is to help patients review their daily goal of treatment and discuss progress on daily workbooks.  Participation Level: Did Not Attend  Participation Quality:  Not Applicable  Affect:  Not Applicable  Cognitive:  Not Applicable  Insight:  Not Applicable  Engagement in Group: Not Applicable  Additional Comments:  The patient did not attend group.   Hazle Coca S 01/13/2021, 9:36 PM
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
## 2021-01-13 NOTE — Progress Notes (Addendum)
[facility] MD Progress Note  01/13/2021 7:01 PM Garrett Bolton  MRN:  [id number] Subjective:  Patient is a 20 yo w/ hx of unspecified schizophrenia spectrum d/o, substance induced psychosis, and marijuana dependence who presents INVOLUNTARILY to [name] from [name] due to increased paranoia that "spirit entered his body through his anus" and that "I needed to get away from those people".   Chart Review, 24 hr Events: The patient's chart was reviewed and nursing notes were reviewed. The patient's case was discussed in multidisciplinary team meeting.  Per MAR: - Patient is compliant with scheduled meds. - PRNs: ativan, hydroxyzine Per RN notes, no documented behavioral issues and is rarely attending group. Patient slept, 8.25 hours  Patient had the following psychiatric recommendations yesterday: -Risperdal 1 mg bid w/ plan to titrate upwards in coming days. R/b/se discussed with patient and patient agreeable to med trial -Agitation protocol prn -Cogentin 1 mg bid prn for tremors   TODAY'S INTERVIEW Patient seen and assessed with attending Dr. Mason Jim.  Patient was initially resistant to taking his Risperdal medication but is compliant with his scheduled medication.  Discussed with patient that we would attempt to avoid him feeling like a "zombie".  Patient still working on getting into homeless shelter in [name].  Patient states that this is in order to get away from people who will IVC him.  Patient is unable to rationalize why he needs to be IVC but simply that he is "trying to keep himself safe from those people".  Patient still insist that his marijuana use is minimal and that the last time he used was approximately 1 month ago.  Patient denies beliefs that spirits are entering his body.  Patient states "you can ignore that."  Patient is agreeable to continue taking his p.o. medications underlying condition that he would be able to discharge to a homeless shelter in [name].  Patient  denying any physical complaints at this time.  Patient states he does not have much of an appetite but is hydrating and sleeping appropriately.  Patient denies nausea, vomiting, headaches, chest pain.  Patient denies any problems with bowel movements.  Patient denies SI/HI/AVH.  Patient appears guarded at times during assessment but was cooperative throughout assessment.  Principal Problem: Schizophrenia spectrum disorder with psychotic disorder type not yet determined (HCC) Diagnosis: Principal Problem:   Schizophrenia spectrum disorder with psychotic disorder type not yet determined (HCC) Active Problems:   Episodic cannabis use  Total Time spent with patient:  I personally spent 35 minutes on the unit in direct patient care. The direct patient care time included face-to-face time with the patient, reviewing the patient's chart, communicating with other professionals, and coordinating care. Greater than 50% of this time was spent in counseling or coordinating care with the patient regarding goals of hospitalization, psycho-education, and discharge planning needs.   Past Psychiatric History: see H&P  Past Medical History:  Past Medical History:  Diagnosis Date   Anemia    Sickle cell trait (HCC)     Past Surgical History:  Procedure Laterality Date   CIRCUMCISION  2001   LAPAROTOMY N/A 08/23/2019   Procedure: EXPLORATORY LAPAROTOMY;  Surgeon: Violeta Gelinas, MD;  Location: [facility] OR;  Service: General;  Laterality: N/A;   Family History: History reviewed. No pertinent family history. Family Psychiatric  History: see H&P Social History:  Social History   Substance and Sexual Activity  Alcohol Use Yes     Social History   Substance and Sexual Activity  Drug Use Yes  Types: Methamphetamines, Marijuana   Comment: Daily    Social History   Socioeconomic History   Marital status: Single    Spouse name: Not on file   Number of children: Not on file   Years of education: Not on  file   Highest education level: Not on file  Occupational History   Not on file  Tobacco Use   Smoking status: Some Days    Packs/day: 0.25    Years: 4.00    Pack years: 1.00    Types: Cigars, Cigarettes   Smokeless tobacco: Never  Vaping Use   Vaping Use: Never used  Substance and Sexual Activity   Alcohol use: Yes   Drug use: Yes    Types: Methamphetamines, Marijuana    Comment: Daily   Sexual activity: Never  Other Topics Concern   Not on file  Social History Narrative   Not on file   Social Determinants of Health   Financial Resource Strain: Not on file  Food Insecurity: Not on file  Transportation Needs: Not on file  Physical Activity: Not on file  Stress: Not on file  Social Connections: Not on file   Additional Social History:                         Sleep: Good  Appetite:  Good  Current Medications: Current Facility-Administered Medications  Medication Dose Route Frequency Provider Last Rate Last Admin   acetaminophen (TYLENOL) tablet 650 mg  650 mg Oral Q6H PRN Nwoko, Uchenna E, PA       alum & mag hydroxide-simeth (MAALOX/MYLANTA) 200-200-20 MG/5ML suspension 30 mL  30 mL Oral Q4H PRN Nwoko, Uchenna E, PA       benztropine (COGENTIN) tablet 1 mg  1 mg Oral BID PRN Comer Locket, MD       benztropine mesylate (COGENTIN) injection 1 mg  1 mg Intramuscular BID PRN Comer Locket, MD       hydrOXYzine (ATARAX/VISTARIL) tablet 25 mg  25 mg Oral Q6H PRN Comer Locket, MD   25 mg at 01/12/21 1654   loperamide (IMODIUM) capsule 2-4 mg  2-4 mg Oral PRN Comer Locket, MD       LORazepam (ATIVAN) tablet 1 mg  1 mg Oral Q6H PRN Mason Jim, Dwayn Moravek E, MD       LORazepam (ATIVAN) tablet 1 mg  1 mg Oral Q6H PRN Mason Jim, Ancelmo Hunt E, MD       magnesium hydroxide (MILK OF MAGNESIA) suspension 30 mL  30 mL Oral Daily PRN Nwoko, Uchenna E, PA       multivitamin with minerals tablet 1 tablet  1 tablet Oral Daily Mason Jim, Javonn Gauger E, MD   1 tablet at 01/13/21 0741    [START ON 01/14/2021] nicotine (NICODERM CQ - dosed in mg/24 hours) patch 21 mg  21 mg Transdermal Daily Mason Jim, Kareli Hossain E, MD       ondansetron (ZOFRAN-ODT) disintegrating tablet 4 mg  4 mg Oral Q6H PRN Mason Jim, Lorelee Mclaurin E, MD       risperiDONE (RISPERDAL M-TABS) disintegrating tablet 2 mg  2 mg Oral Q8H PRN Park Pope, MD       And   ziprasidone (GEODON) injection 20 mg  20 mg Intramuscular PRN Park Pope, MD       risperiDONE (RISPERDAL M-TABS) disintegrating tablet 2 mg  2 mg Oral BID Park Pope, MD   2 mg at 01/13/21 1040   thiamine tablet 100 mg  100 mg Oral Daily Mason Jim, Kenzee Bassin E, MD   100 mg at 01/13/21 0741   traZODone (DESYREL) tablet 50 mg  50 mg Oral QHS PRN Nwoko, Uchenna E, PA        Lab Results: No results found for this or any previous visit (from the past 48 hour(s)).  Blood Alcohol level:  Lab Results  Component Value Date   ETH 20 (H) 01/01/2021   ETH <10 02/25/2020    Metabolic Disorder Labs: Lab Results  Component Value Date   HGBA1C 4.4 (L) 01/01/2021   MPG 79.58 01/01/2021   MPG 76.71 04/21/2019   Lab Results  Component Value Date   PROLACTIN 15.2 04/21/2019   Lab Results  Component Value Date   CHOL 161 01/01/2021   TRIG 60 01/01/2021   HDL 59 01/01/2021   CHOLHDL 2.7 01/01/2021   VLDL 12 01/01/2021   LDLCALC 90 01/01/2021   LDLCALC 90 04/21/2019    Physical Findings: AIMS: Facial and Oral Movements Muscles of Facial Expression: None, normal Lips and Perioral Area: None, normal Jaw: None, normal Tongue: None, normal,Extremity Movements Upper (arms, wrists, hands, fingers): None, normal Lower (legs, knees, ankles, toes): None, normal, Trunk Movements Neck, shoulders, hips: None, normal, Overall Severity Severity of abnormal movements (highest score from questions above): None, normal Incapacitation due to abnormal movements: None, normal Patient's awareness of abnormal movements (rate only patient's report): No Awareness, Dental Status Current  problems with teeth and/or dentures?: No Does patient usually wear dentures?: No  CIWA:  CIWA-Ar Total: 1 COWS:     Musculoskeletal: Strength & Muscle Tone: within normal limits Gait & Station: normal Patient leans: N/A  Psychiatric Specialty Exam:  Presentation  General Appearance:  Fair hygiene, casually dressed but wearing jacket on the unit  Eye Contact:Fair   Speech:Clear and Coherent; Normal Rate  Speech Volume:Normal  Handedness:Right   Mood and Affect  Mood:aloof, suspicious  Affect:guarded, constricted   Thought Process  Thought Processes:ruminative about discharge plans; tangential at times  Orientation:Full (Time, Place and Person)  Thought Content:Had reported delusions prior to admission but denies these today; appears paranoid and suspicious on exam; makes paranoid statements about his family; denies AVH, ideas of reference or first rank symptoms   History of Schizophrenia/Schizoaffective disorder:Yes  Duration of Psychotic Symptoms:Greater than six months  Hallucinations:Denied  Ideas of Reference:None  Suicidal Thoughts:Suicidal Thoughts: No  Homicidal Thoughts:Homicidal Thoughts: No   Sensorium  Memory:Immediate Good; Recent Good; Remote Good  Judgment:Impaired  Insight:Lacking   Executive Functions  Concentration:Fair  Attention Span:Fair  Recall:Fair  Fund of Knowledge:Fair  Language:Fair   Psychomotor Activity  Psychomotor Activity:Psychomotor Activity: Normal   Assets  Assets:Communication Skills; Desire for Improvement; Financial Resources/Insurance; Physical Health   Sleep  Sleep:8.25 hours   Physical Exam Vitals and nursing note reviewed.  Constitutional:      Appearance: Normal appearance. He is normal weight.  HENT:     Head: Normocephalic and atraumatic.  Pulmonary:     Effort: Pulmonary effort is normal.  Neurological:     General: No focal deficit present.     Mental Status: He is oriented to  person, place, and time.   Review of Systems  Respiratory:  Negative for shortness of breath.   Cardiovascular:  Negative for chest pain.  Gastrointestinal:  Negative for abdominal pain, constipation, diarrhea, heartburn, nausea and vomiting.  Neurological:  Negative for headaches.  Blood pressure (!) 132/95, pulse 84, resp. rate 16, SpO2 99 %. There is no height or weight  on file to calculate BMI.   Treatment Plan Summary: Daily contact with patient to assess and evaluate symptoms and progress in treatment and Medication management  ASSESSMENT Patient is a 20 yo w/ hx of Unspecified schizophrenia spectrum and other psychotic d/o, substance induced psychosis, and marijuana dependence who presents INVOLUNTARILY to [facility] from [facility] due to increased paranoia that "spirit entered his body through his anus" and that "I needed to get away from those people".  Patient appears guarded but cooperative during assessment.  Patient still focused to get to homeless shelter following discharge away from sister and mother.   PLAN Psychiatric Problems Unspecified schizophrenia spectrum and other Psychotic d/o (r/o substance induced psychotic disorder r/o schizophreniform d/o) -Increase Risperdal 2 mg bid for residual paranoia and delusions - will discuss use of LAI when patient can engage in meaningful conversation of options given medication noncompliance history (Qtc 10/27 405) -Agitation protocol prn -Cogentin 1 mg bid prn for tremors  Cannabis use d/o by hx R/o alcohol use d/o - UDS positive for THC  - Continue CIWA protocol with recent scores 2,0,1 - Contineu MVI and thiamine replacement - Encouraged abstinence from illicit substance use    Medical Problems Wnl TSH CMP/CBC pending   PRNs Tylenol 650 mg for mild pain Maalox/Mylanta 30 mL for indigestion Bendaryl q6hr for itching/allergies Hydroxyzine 25 mg tid for anxiety Milk of Magnesia 30 mL for constipation Trazodone 50 mg for sleep    3. Safety and Monitoring: Involuntary admission to inpatient psychiatric unit for safety, stabilization and treatment Daily contact with patient to assess and evaluate symptoms and progress in treatment Patient's case to be discussed in multi-disciplinary team meeting Observation Level : q15 minute checks Vital signs: q12 hours Precautions: suicide, elopement, and assault   4. Discharge Planning: Social work and case management to assist with discharge planning and identification of hospital follow-up needs prior to discharge Estimated LOS: 5-7 days Discharge Concerns: Need to establish a safety plan; Medication compliance and effectiveness Discharge Goals: Return home with outpatient referrals for mental health follow-up including medication management/psychotherapy  Park Pope, MD 01/13/2021, 7:01 PM

## 2021-01-13 NOTE — [facility] Group Notes (Signed)
Adult Psychoeducational Group Note  Date:  01/13/2021 Time:  8:56 AM  Group Topic/Focus:  Goals Group:   The focus of this group is to help patients establish daily goals to achieve during treatment and discuss how the patient can incorporate goal setting into their daily lives to aide in recovery.  Participation Level:  Active  Participation Quality:  Appropriate  Affect:  Appropriate  Cognitive:  Appropriate  Insight: Appropriate  Engagement in Group:  Improving  Modes of Intervention:  Discussion  Additional Comments:  PT Goal: Talk to Sharyl Nimrod ( Child psychotherapist)  Donell Beers 01/13/2021, 8:56 AM

## 2021-01-13 NOTE — [facility] Suicide Risk Assessment (Signed)
[facility] INPATIENT:  Family/Significant Other Suicide Prevention Education  Suicide Prevention Education:  Patient Refusal for Family/Significant Other Suicide Prevention Education: The patient Garrett Bolton has refused to provide written consent for family/significant other to be provided Family/Significant Other Suicide Prevention Education during admission and/or prior to discharge.  Physician notified.     Alyssamae Klinck A Minola Guin 01/13/2021, 11:18 AM

## 2021-01-14 DIAGNOSIS — F29 Unspecified psychosis not due to a substance or known physiological condition: Secondary | ICD-10-CM | POA: Diagnosis not present

## 2021-01-14 NOTE — Progress Notes (Signed)
Progress note  Pt found in bed; compliant with medication administration. Pt denies any physical complaints or pain. Pt denies any delusions or hallucinations. Pt continues to comply with medications and is hopeful this will allow them to be discharged. Pt expresses anxiety around leaving. Pt is pleasant. Pt denies si/hi/ah/vh and verbally agrees to approach staff if these become apparent or before harming themselves/others while at [facility].  A: Pt provided support and encouragement. Pt given medication per protocol and standing orders. Q63m safety checks implemented and continued.  R: Pt safe on the unit. Will continue to monitor.

## 2021-01-14 NOTE — [facility] Group Notes (Signed)
Spirituality group facilitated by Kathleen Argue, B[facility].   Group Description: Group focused on topic of hope. Patients participated in facilitated discussion around topic, connecting with one another around experiences and definitions for hope. Group members engaged with visual explorer photos, reflecting on what hope looks like for them today. Group engaged in discussion around how their definitions of hope are present today in hospital.   Modalities: Psycho-social ed, Adlerian, Narrative, MI   Patient Progress: Garrett Bolton came to group at the very end.  He engaged with his peers after group ended for a few minutes.  Chaplain Katy Nickie Warwick, Bcc Pager, [phone] 5:22 PM

## 2021-01-14 NOTE — Progress Notes (Signed)
Pt didn't attend orientation/goals group. 

## 2021-01-14 NOTE — Plan of Care (Signed)
  Problem: Education: Goal: Verbalization of understanding the information provided will improve Outcome: Progressing   Problem: Activity: Goal: Interest or engagement in activities will improve Outcome: Progressing Goal: Sleeping patterns will improve Outcome: Progressing   

## 2021-01-14 NOTE — [facility] Group Notes (Signed)
Pt didn't attend group. 

## 2021-01-14 NOTE — Group Note (Deleted)
LCSW Group Therapy Note   Group Date: 01/14/2021 Start Time: 1100 End Time: 1200   Type of Therapy and Topic:  Group Therapy:   Participation Level:  {[facility] PARTICIPATION LEVEL:22264}  Description of Group:   Therapeutic Goals:  1.     Summary of Patient Progress:    ***  Therapeutic Modalities:   Garrett Bolton E Maggie Dworkin, LCSWA 01/14/2021  11:58 AM    

## 2021-01-14 NOTE — Progress Notes (Signed)
Pt refused lab work this morning. Pt says he doesn't feel comfortable giving blood because he has the sickle cell trait. Pt encouraged to speak to his provider this morning about his concerns.

## 2021-01-14 NOTE — Progress Notes (Signed)
Pt didn't attend psycho-ed group.  

## 2021-01-14 NOTE — Group Note (Signed)
LCSW Group Therapy Note  Group Date: 01/14/2021 Start Time: 1100 End Time: 1200   Type of Therapy and Topic:  Group Therapy - Healthy vs Unhealthy Coping Skills  Participation Level:  Active   Description of Group The focus of this group was to determine what unhealthy coping techniques typically are used by group members and what healthy coping techniques would be helpful in coping with various problems. Patients were guided in becoming aware of the differences between healthy and unhealthy coping techniques. Patients were asked to identify 2-3 healthy coping skills they would like to learn to use more effectively.  Therapeutic Goals Patients learned that coping is what human beings do all day long to deal with various situations in their lives Patients defined and discussed healthy vs unhealthy coping techniques Patients identified their preferred coping techniques and identified whether these were healthy or unhealthy Patients determined 2-3 healthy coping skills they would like to become more familiar with and use more often. Patients provided support and ideas to each other   Summary of Patient Progress:  Patient participated appropriately in group. Patient discussed deep breathing and ability to be active as ways to relax and cope with stress.  Patient participated in muscle progression activity.  Patient engaged in conversation about relaxation techniques and discussed how stress can affect the body.    Therapeutic Modalities Cognitive Behavioral Therapy Motivational Interviewing  Beatris Si, LCSW 01/14/2021  12:17 PM

## 2021-01-14 NOTE — Progress Notes (Addendum)
[facility] MD Progress Note  01/14/2021 4:47 PM Garrett Bolton  MRN:  [id number] Subjective:  Patient is a 20 yo w/ hx of unspecified schizophrenia spectrum d/o, substance induced psychosis, and marijuana dependence who presents INVOLUNTARILY to [facility] from [facility] due to increased paranoia that "spirit entered his body through his anus" and that "I needed to get away from those people".   Chart Review, 24 hr Events: The patient's chart was reviewed and nursing notes were reviewed. The patient's case was discussed in multidisciplinary team meeting.  Per MAR: - Patient is compliant with scheduled meds. - PRNs: none Per RN notes, no documented behavioral issues and is rarely attending group. Patient slept, 8.25 hours  Patient had the following psychiatric recommendations yesterday: -Increase Risperdal 2 mg bid for residual paranoia and delusions  -Agitation protocol prn -Cogentin 1 mg bid prn for tremors   TODAY'S INTERVIEW Patient seen and assessed with attending Dr. Mason Jim.  Patient appears to be doing well today.  Patient states that his mood has been very good.  Patient states that he "cannot complain" about his day.  Patient states that he has been compliant with attending groups, taking medication, and performing regular hygiene.  Patient states that he still would like to go to a homeless shelter in [place] "so that those people will not be able to IVC me and sent me to hospital".  Discussed with patient that if he continues to have bizarre behaviors, regardless of where he goes, he will likely be IVC and sent back to hospital.  Patient verbalized understanding and states that he will stick to his medication regimen.  Patient does not want Gean Birchwood shot at this point in time.  Patient does state that he will comply with medication following discharge.  Patient does have some insight stating that given the fact that he has not been using marijuana for a month prior to admission,  that his delusions of spirits entering his anus must be due to not taking psychiatric medications before admission.  Patient states that he has not been taking his Risperdal medication for several months and this is likely contributing to his delusions.  Patient recognizes the importance of taking these medications as well as has some insight as to needing them in order to continue with his way of life.  Patient does state that he will continue to abstain from marijuana upon discharge.  Patient denies any physical complaints at this time.  Patient states that due to his sickle cell trait, he will continue to refuse blood draws.  Patient states that "you guys take from me and never give it back".  Encouraged patient to reconsider but patient states that he would like to continue to not have blood work done because of he feels tired and weak after blood draws.  Patient resists increasing Risperdal dose further due to concern it will make him feel like a "zombie".  Patient appreciated concern being addressed.Patient denies SI/HI/AVH.  Patient appears guarded at times during assessment but was cooperative throughout assessment.  Principal Problem: Schizophrenia spectrum disorder with psychotic disorder type not yet determined (HCC) Diagnosis: Principal Problem:   Schizophrenia spectrum disorder with psychotic disorder type not yet determined (HCC) Active Problems:   Episodic cannabis use  Total Time spent with patient:  I personally spent 35 minutes on the unit in direct patient care. The direct patient care time included face-to-face time with the patient, reviewing the patient's chart, communicating with other professionals, and coordinating care. Greater than  50% of this time was spent in counseling or coordinating care with the patient regarding goals of hospitalization, psycho-education, and discharge planning needs.   Past Psychiatric History: see H&P  Past Medical History:  Past Medical History:   Diagnosis Date   Anemia    Sickle cell trait (HCC)     Past Surgical History:  Procedure Laterality Date   CIRCUMCISION  2001   LAPAROTOMY N/A 08/23/2019   Procedure: EXPLORATORY LAPAROTOMY;  Surgeon: Violeta Gelinas, MD;  Location: [facility] OR;  Service: General;  Laterality: N/A;   Family History:See H&P  Family Psychiatric  History: see H&P  Social History:  Social History   Substance and Sexual Activity  Alcohol Use Yes     Social History   Substance and Sexual Activity  Drug Use Yes   Types: Methamphetamines, Marijuana   Comment: Daily    Social History   Socioeconomic History   Marital status: Single    Spouse name: Not on file   Number of children: Not on file   Years of education: Not on file   Highest education level: Not on file  Occupational History   Not on file  Tobacco Use   Smoking status: Some Days    Packs/day: 0.25    Years: 4.00    Pack years: 1.00    Types: Cigars, Cigarettes   Smokeless tobacco: Never  Vaping Use   Vaping Use: Never used  Substance and Sexual Activity   Alcohol use: Yes   Drug use: Yes    Types: Methamphetamines, Marijuana    Comment: Daily   Sexual activity: Never  Other Topics Concern   Not on file  Social History Narrative   Not on file   Social Determinants of Health   Financial Resource Strain: Not on file  Food Insecurity: Not on file  Transportation Needs: Not on file  Physical Activity: Not on file  Stress: Not on file  Social Connections: Not on file     Sleep: Good  Appetite:  Good  Current Medications: Current Facility-Administered Medications  Medication Dose Route Frequency Provider Last Rate Last Admin   acetaminophen (TYLENOL) tablet 650 mg  650 mg Oral Q6H PRN Nwoko, Uchenna E, PA       alum & mag hydroxide-simeth (MAALOX/MYLANTA) 200-200-20 MG/5ML suspension 30 mL  30 mL Oral Q4H PRN Nwoko, Uchenna E, PA       benztropine (COGENTIN) tablet 1 mg  1 mg Oral BID PRN Mason Jim, Gracin Mcpartland E, MD        benztropine mesylate (COGENTIN) injection 1 mg  1 mg Intramuscular BID PRN Comer Locket, MD       feeding supplement (ENSURE ENLIVE / ENSURE PLUS) liquid 237 mL  237 mL Oral BID BM Park Pope, MD   237 mL at 01/14/21 1149   hydrOXYzine (ATARAX/VISTARIL) tablet 25 mg  25 mg Oral Q6H PRN Comer Locket, MD   25 mg at 01/12/21 1654   loperamide (IMODIUM) capsule 2-4 mg  2-4 mg Oral PRN Comer Locket, MD       LORazepam (ATIVAN) tablet 1 mg  1 mg Oral Q6H PRN Mason Jim, Madine Sarr E, MD       LORazepam (ATIVAN) tablet 1 mg  1 mg Oral Q6H PRN Mason Jim, Mallary Kreger E, MD       magnesium hydroxide (MILK OF MAGNESIA) suspension 30 mL  30 mL Oral Daily PRN Nwoko, Uchenna E, PA       multivitamin with minerals tablet 1 tablet  1 tablet Oral Daily Comer Locket, MD   1 tablet at 01/14/21 0820   nicotine (NICODERM CQ - dosed in mg/24 hours) patch 21 mg  21 mg Transdermal Daily Mason Jim, Deleon Passe E, MD       ondansetron (ZOFRAN-ODT) disintegrating tablet 4 mg  4 mg Oral Q6H PRN Mason Jim, Sutter Ahlgren E, MD       risperiDONE (RISPERDAL M-TABS) disintegrating tablet 2 mg  2 mg Oral Q8H PRN Park Pope, MD       And   ziprasidone (GEODON) injection 20 mg  20 mg Intramuscular PRN Park Pope, MD       risperiDONE (RISPERDAL M-TABS) disintegrating tablet 2 mg  2 mg Oral BID Park Pope, MD   2 mg at 01/14/21 0820   thiamine tablet 100 mg  100 mg Oral Daily Mason Jim, Myiah Petkus E, MD   100 mg at 01/14/21 0820   traZODone (DESYREL) tablet 50 mg  50 mg Oral QHS PRN Nwoko, Stephens Shire E, PA        Lab Results: No results found for this or any previous visit (from the past 48 hour(s)).  Blood Alcohol level:  Lab Results  Component Value Date   ETH 20 (H) 01/01/2021   ETH <10 02/25/2020    Metabolic Disorder Labs: Lab Results  Component Value Date   HGBA1C 4.4 (L) 01/01/2021   MPG 79.58 01/01/2021   MPG 76.71 04/21/2019   Lab Results  Component Value Date   PROLACTIN 15.2 04/21/2019   Lab Results  Component Value Date   CHOL  161 01/01/2021   TRIG 60 01/01/2021   HDL 59 01/01/2021   CHOLHDL 2.7 01/01/2021   VLDL 12 01/01/2021   LDLCALC 90 01/01/2021   LDLCALC 90 04/21/2019    Physical Findings: AIMS: Facial and Oral Movements Muscles of Facial Expression: None, normal Lips and Perioral Area: None, normal Jaw: None, normal Tongue: None, normal,Extremity Movements Upper (arms, wrists, hands, fingers): None, normal Lower (legs, knees, ankles, toes): None, normal, Trunk Movements Neck, shoulders, hips: None, normal, Overall Severity Severity of abnormal movements (highest score from questions above): None, normal Incapacitation due to abnormal movements: None, normal Patient's awareness of abnormal movements (rate only patient's report): No Awareness, Dental Status Current problems with teeth and/or dentures?: No Does patient usually wear dentures?: No  CIWA:  CIWA-Ar Total: 0   Musculoskeletal: Strength & Muscle Tone: within normal limits Gait & Station: normal Patient leans: N/A  Psychiatric Specialty Exam:  Presentation  General Appearance:  Fair hygiene, casually dressed but wearing jacket on the unit with sweat pants  Eye Contact:Fair   Speech:Clear and Coherent; Normal Rate  Speech Volume:Normal  Handedness:Right   Mood and Affect  Mood:aloof  Affect:guarded but less constricted   Thought Process  Thought Processes:ruminative about discharge plans; more superficially linear and goal directed today  Orientation:Full (Time, Place and Person)  Thought Content:Denies current delusions but still guarded on exam; makes paranoid statements about his family; denies AVH, ideas of reference or first rank symptoms - is not grossly responding to internal/external stimuli on exam   History of Schizophrenia/Schizoaffective disorder:Yes  Duration of Psychotic Symptoms:Greater than six months  Hallucinations:Denied  Ideas of Reference:None  Suicidal Thoughts:Denied  Homicidal  Thoughts:Denied   Sensorium  Memory: Fair   Judgment:Fair - complying with medications  Insight:Improving   Executive Functions  Concentration:Fair  Attention Span:Fair  Recall:Fair  Fund of Knowledge:Fair  Language:Fair   Psychomotor Activity  Psychomotor Activity:Normal - no restlessness or akathisias  Assets  Assets:Communication Skills; Desire for Improvement; Financial Resources/Insurance; Physical Health   Sleep  Sleep:8.25 hours   Physical Exam Vitals and nursing note reviewed.  Constitutional:      Appearance: Normal appearance. He is normal weight.  HENT:     Head: Normocephalic and atraumatic.  Pulmonary:     Effort: Pulmonary effort is normal.  Neurological:     General: No focal deficit present.     Mental Status: He is oriented to person, place, and time.   Review of Systems  Respiratory:  Negative for shortness of breath.   Cardiovascular:  Negative for chest pain.  Gastrointestinal:  Negative for abdominal pain, constipation, diarrhea, heartburn, nausea and vomiting.  Neurological:  Negative for headaches.  Blood pressure (!) 132/95, pulse 84, resp. rate 16, SpO2 99 %. There is no height or weight on file to calculate BMI.   Treatment Plan Summary: Daily contact with patient to assess and evaluate symptoms and progress in treatment and Medication management  ASSESSMENT Patient is a 20 yo w/ hx of Unspecified schizophrenia spectrum and other psychotic d/o, substance induced psychosis, and marijuana dependence who presents INVOLUNTARILY to [facility] from [facility] due to increased paranoia that "spirit entered his body through his anus" and that "I needed to get away from those people".  Patient appears guarded but does not endorse delusional thoughts today. He continues to make paranoid references about family. He is clinically improving and has had no acute behavioral issues on the unit.   PLAN Psychiatric Problems Unspecified schizophrenia  spectrum and other Psychotic d/o (r/o substance induced psychotic disorder r/o schizophreniform d/o) -Continue Risperdal 2 mg bid for residual paranoia and delusions - Patient refusing LAI at this time  (Qtc 10/27 405, Lipid panel WNLon 01/01/21  and A1c 4.4 on 01/01/21) -Agitation protocol prn -Cogentin 1 mg bid prn for tremors  Cannabis use d/o by hx R/o alcohol use d/o - UDS positive for THC  - Continue CIWA protocol with recent scores 0,1,0 - Continue MVI and thiamine replacement - Encouraged abstinence from illicit substance use    Medical Problems Admission CMP/CBC pending and patient refusing lab draws at this time (CBC from 01/01/21 showed leukocytosis with WBC 10.9 and CMP on 01/01/21 WNL)   PRNs Tylenol 650 mg for mild pain Maalox/Mylanta 30 mL for indigestion Bendaryl q6hr for itching/allergies Hydroxyzine 25 mg tid for anxiety Milk of Magnesia 30 mL for constipation Trazodone 50 mg for sleep   3. Safety and Monitoring: Involuntary admission to inpatient psychiatric unit for safety, stabilization and treatment Daily contact with patient to assess and evaluate symptoms and progress in treatment Patient's case to be discussed in multi-disciplinary team meeting Observation Level : q15 minute checks Vital signs: q12 hours Precautions: suicide, elopement, and assault   4. Discharge Planning: Social work and case management to assist with discharge planning and identification of hospital follow-up needs prior to discharge Estimated LOS: 5-7 days Discharge Concerns: Need to establish a safety plan; Medication compliance and effectiveness Discharge Goals: Return home with outpatient referrals for mental health follow-up including medication management/psychotherapy  Park Pope, MD 01/14/2021, 4:47 PM

## 2021-01-14 NOTE — Progress Notes (Signed)
Pt didn't attend therapeutic relaxation group. 

## 2021-01-15 MED ORDER — LORAZEPAM 1 MG PO TABS: 1.0000 mg | ORAL_TABLET | Freq: Four times a day (QID) | ORAL | Status: DC | PRN

## 2021-01-15 MED ORDER — LOPERAMIDE HCL 2 MG PO CAPS: 2.0000 mg | ORAL_CAPSULE | ORAL | Status: DC | PRN

## 2021-01-15 MED ORDER — ONDANSETRON 4 MG PO TBDP: 4.0000 mg | ORAL_TABLET | Freq: Four times a day (QID) | ORAL | Status: DC | PRN

## 2021-01-15 MED ORDER — HYDROXYZINE HCL 25 MG PO TABS: 25.0000 mg | ORAL_TABLET | Freq: Four times a day (QID) | ORAL | Status: DC | PRN

## 2021-01-15 NOTE — Group Note (Signed)
Group Topic: Other  Group Date: 01/15/2021 Start Time: 1000 End Time: 1100 Facilitators: Philis Doke, Deforest Hoyles, NT  Department[facility]  Number of Participants: 8  Group Focus: activities of daily living skills, daily focus, and nursing group  Name: Quindell Shere Date of Birth: 2000/04/02  MR: [id number]    Level of Participation: Pt did not attend nursing psycho ed group.

## 2021-01-15 NOTE — Progress Notes (Signed)
Progress note    01/15/21 0800  Psych Admission Type (Psych Patients Only)  Admission Status Involuntary  Psychosocial Assessment  Patient Complaints Anxiety  Eye Contact Fair  Facial Expression Anxious;Pensive  Affect Anxious;Appropriate to circumstance  Speech Logical/coherent  Interaction Assertive  Motor Activity Slow  Appearance/Hygiene Unremarkable  Behavior Characteristics Cooperative;Appropriate to situation;Anxious  Mood Anxious;Pleasant  Thought Process  Coherency Concrete thinking  Content WDL  Delusions Controlled  Perception WDL  Hallucination None reported or observed  Judgment Poor  Confusion None  Danger to Self  Current suicidal ideation? Denies  Danger to Others  Danger to Others None reported or observed

## 2021-01-15 NOTE — Group Note (Signed)
Group Topic: Goal Setting  Group Date: 01/15/2021 Start Time: 0900 End Time: 0930 Facilitators: Montzerrat Brunell, Deforest Hoyles, NT  Department: [facility]  Number of Participants: 8  Interventions utilized were orientation  Name: Garrett Bolton Date of Birth: 2000-07-16  MR: [id number]    Level of Participation: Pt did not attend morning goals group.

## 2021-01-15 NOTE — Group Note (Signed)
LCSW Group Therapy Note  Group Date: 01/15/2021 Start Time: 1130 End Time: 1200   Type of Therapy and Topic:  Group Therapy: Anger Cues and Responses  Participation Level:  Active   Description of Group:   In this group, patients learned how to recognize the physical, cognitive, emotional, and behavioral responses they have to anger-provoking situations.  They identified a recent time they became angry and how they reacted.  They analyzed how their reaction was possibly beneficial and how it was possibly unhelpful.  The group discussed a variety of healthier coping skills that could help with such a situation in the future.  Focus was placed on how helpful it is to recognize the underlying emotions to our anger, because working on those can lead to a more permanent solution as well as our ability to focus on the important rather than the urgent.  Therapeutic Goals: Patients will remember their last incident of anger and how they felt emotionally and physically, what their thoughts were at the time, and how they behaved. Patients will identify how their behavior at that time worked for them, as well as how it worked against them. Patients will explore possible new behaviors to use in future anger situations. Patients will learn that anger itself is normal and cannot be eliminated, and that healthier reactions can assist with resolving conflict rather than worsening situations.  Summary of Patient Progress:  Garrett Bolton was active during the group. He shared a recent occurrence wherein feeling anger today about not discharging as he had expected led him to go to his room to cool off.  He was given positive feedback for responding with healthy coping skills. He demonstrated good insight into the subject matter, was respectful of peers, and participated throughout the entire session.  Therapeutic Modalities:   Cognitive Behavioral Therapy    Lynnell Chad, LCSWA 01/15/2021  1:19 PM

## 2021-01-15 NOTE — Progress Notes (Signed)
Patient spent most of the shift resting in bed, he got up for snacks and medication. He denies AVH and HI & SI.

## 2021-01-15 NOTE — [facility] Group Notes (Signed)
Psychoeducational Group Note  Date: 01/13/2021 Time: 0900-1000    Goal Setting   Purpose of Group: This group helps to provide patients with the steps of setting a goal that is specific, measurable, attainable, realistic and time specific. A discussion on how we keep ourselves stuck with negative self talk.    Participation Level:  Came in the room, sat, and then left stating that he wanted to be alone and think.   Garrett Bolton
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
## 2021-01-15 NOTE — Plan of Care (Signed)
  Problem: Coping: Goal: Ability to verbalize frustrations and anger appropriately will improve Outcome: Progressing Goal: Ability to demonstrate self-control will improve Outcome: Progressing   Problem: Health Behavior/Discharge Planning: Goal: Identification of resources available to assist in meeting health care needs will improve Outcome: Progressing   

## 2021-01-16 DIAGNOSIS — F29 Unspecified psychosis not due to a substance or known physiological condition: Secondary | ICD-10-CM | POA: Diagnosis not present

## 2021-01-16 NOTE — Progress Notes (Signed)
Adult Psychoeducational Group Note  Date:  01/16/2021 Time:  1:02 AM  Group Topic/Focus:  Wrap-Up Group:   The focus of this group is to help patients review their daily goal of treatment and discuss progress on daily workbooks.  Participation Level:  Active  Participation Quality:  Attentive  Affect:  Appropriate  Cognitive:  Appropriate  Insight: Appropriate  Engagement in Group:  Engaged  Modes of Intervention:  Discussion  Additional Comments:  Pt reported doing better than when he first got here, think his medications are working.  Bethann Punches 01/16/2021, 1:02 AM

## 2021-01-16 NOTE — Progress Notes (Deleted)
The patient expressed in group that he spoke with his counselor today and found out when he will be discharged. He spent much of his day watching movies. His goal for tomorrow is to try "staying the same" as he did today.

## 2021-01-16 NOTE — Progress Notes (Addendum)
[facility] MD Progress Note  01/16/2021 1:15 PM Garrett Bolton  MRN:  [id number] Subjective:  Patient is a 20 yo w/ hx of unspecified schizophrenia spectrum d/o, substance induced psychosis, and marijuana dependence who presents INVOLUNTARILY to [facility] from [facility] due to increased paranoia that "spirit entered his body through his anus" and that "I needed to get away from those people".   Chart Review, 24 hr Events: The patient's chart was reviewed and nursing notes were reviewed. The patient's case was discussed in multidisciplinary team meeting.  Per MAR: - Patient is compliant with scheduled meds. - PRNs: none Per RN notes, no documented behavioral issues and is rarely attending group. Patient slept, 7 hours  TODAY'S INTERVIEW Patient seen and assessed with attending Dr. Mason Jim.  Patient appears to be doing well today.  Patient handed Dr. Mason Jim a note that indicated discharge planning that he had written down.  Note stated that he would like to discharge not to [facility] but to a homeless shelter within [place].  Note reports that he does not know anybody in [place] which would make living in [place] extremely difficult.  Discussed with patient whether he would be willing to take the Invega shot in order to promote compliance but patient insists on taking Risperdal orally instead.  Patient does state that the Risperdal is helping with his mood lability as well as sequestering delusions of spirits.  Discussed with patient that should he continue with compliance he would likely be discharged Tuesday or Wednesday.  Patient was extremely excited to hear this and was agreeable to plan.  Patient had no physical complaints at this time.  Patient states he is sleeping well, eating well, using the bathroom appropriately.  Patient denies SI/HI/AVH, ideas of reference, or first rank symptoms. He makes no delusional statements on exam.  Principal Problem: Schizophrenia spectrum  disorder with psychotic disorder type not yet determined (HCC) Diagnosis: Principal Problem:   Schizophrenia spectrum disorder with psychotic disorder type not yet determined [facility]) Active Problems:   Episodic cannabis use  Total Time spent with patient:  I personally spent 25 minutes on the unit in direct patient care. The direct patient care time included face-to-face time with the patient, reviewing the patient's chart, communicating with other professionals, and coordinating care. Greater than 50% of this time was spent in counseling or coordinating care with the patient regarding goals of hospitalization, psycho-education, and discharge planning needs.   Past Psychiatric History: see H&P  Past Medical History:  Past Medical History:  Diagnosis Date   Anemia    Sickle cell trait (HCC)     Past Surgical History:  Procedure Laterality Date   CIRCUMCISION  2001   LAPAROTOMY N/A 08/23/2019   Procedure: EXPLORATORY LAPAROTOMY;  Surgeon: Violeta Gelinas, MD;  Location: [facility] OR;  Service: General;  Laterality: N/A;   Family History:See H&P  Family Psychiatric  History: see H&P  Social History:  Social History   Substance and Sexual Activity  Alcohol Use Yes     Social History   Substance and Sexual Activity  Drug Use Yes   Types: Methamphetamines, Marijuana   Comment: Daily    Social History   Socioeconomic History   Marital status: Single    Spouse name: Not on file   Number of children: Not on file   Years of education: Not on file   Highest education level: Not on file  Occupational History   Not on file  Tobacco Use   Smoking status: Some Days  Packs/day: 0.25    Years: 4.00    Pack years: 1.00    Types: Cigars, Cigarettes   Smokeless tobacco: Never  Vaping Use   Vaping Use: Never used  Substance and Sexual Activity   Alcohol use: Yes   Drug use: Yes    Types: Methamphetamines, Marijuana    Comment: Daily   Sexual activity: Never  Other Topics Concern    Not on file  Social History Narrative   Not on file   Social Determinants of Health   Financial Resource Strain: Not on file  Food Insecurity: Not on file  Transportation Needs: Not on file  Physical Activity: Not on file  Stress: Not on file  Social Connections: Not on file     Sleep: Good  Appetite:  Good  Current Medications: Current Facility-Administered Medications  Medication Dose Route Frequency Provider Last Rate Last Admin   acetaminophen (TYLENOL) tablet 650 mg  650 mg Oral Q6H PRN Nwoko, Uchenna E, PA       alum & mag hydroxide-simeth (MAALOX/MYLANTA) 200-200-20 MG/5ML suspension 30 mL  30 mL Oral Q4H PRN Nwoko, Uchenna E, PA       benztropine (COGENTIN) tablet 1 mg  1 mg Oral BID PRN Mason Jim, Hanad Leino E, MD       benztropine mesylate (COGENTIN) injection 1 mg  1 mg Intramuscular BID PRN Comer Locket, MD       feeding supplement (ENSURE ENLIVE / ENSURE PLUS) liquid 237 mL  237 mL Oral BID BM Park Pope, MD   237 mL at 01/15/21 2041   hydrOXYzine (ATARAX/VISTARIL) tablet 25 mg  25 mg Oral Q6H PRN Comer Locket, MD       loperamide (IMODIUM) capsule 2-4 mg  2-4 mg Oral PRN Comer Locket, MD       LORazepam (ATIVAN) tablet 1 mg  1 mg Oral Q6H PRN Mason Jim, Shanyla Marconi E, MD       LORazepam (ATIVAN) tablet 1 mg  1 mg Oral Q6H PRN Mason Jim, Chloee Tena E, MD       magnesium hydroxide (MILK OF MAGNESIA) suspension 30 mL  30 mL Oral Daily PRN Nwoko, Uchenna E, PA       multivitamin with minerals tablet 1 tablet  1 tablet Oral Daily Mason Jim, Aubrea Meixner E, MD   1 tablet at 01/16/21 0837   nicotine (NICODERM CQ - dosed in mg/24 hours) patch 21 mg  21 mg Transdermal Daily Mason Jim, Karlon Schlafer E, MD   21 mg at 01/15/21 0800   ondansetron (ZOFRAN-ODT) disintegrating tablet 4 mg  4 mg Oral Q6H PRN Comer Locket, MD       risperiDONE (RISPERDAL M-TABS) disintegrating tablet 2 mg  2 mg Oral Q8H PRN Park Pope, MD       And   ziprasidone (GEODON) injection 20 mg  20 mg Intramuscular PRN Park Pope,  MD       risperiDONE (RISPERDAL M-TABS) disintegrating tablet 2 mg  2 mg Oral BID Park Pope, MD   2 mg at 01/16/21 1610   thiamine tablet 100 mg  100 mg Oral Daily Mason Jim, Letonya Mangels E, MD   100 mg at 01/16/21 0836   traZODone (DESYREL) tablet 50 mg  50 mg Oral QHS PRN Nwoko, Uchenna E, PA   50 mg at 01/14/21 2202    Lab Results: No results found for this or any previous visit (from the past 48 hour(s)).  Blood Alcohol level:  Lab Results  Component Value Date   [facility]  20 (H) 01/01/2021   ETH <10 02/25/2020    Metabolic Disorder Labs: Lab Results  Component Value Date   HGBA1C 4.4 (L) 01/01/2021   MPG 79.58 01/01/2021   MPG 76.71 04/21/2019   Lab Results  Component Value Date   PROLACTIN 15.2 04/21/2019   Lab Results  Component Value Date   CHOL 161 01/01/2021   TRIG 60 01/01/2021   HDL 59 01/01/2021   CHOLHDL 2.7 01/01/2021   VLDL 12 01/01/2021   LDLCALC 90 01/01/2021   LDLCALC 90 04/21/2019    Physical Findings: AIMS: Facial and Oral Movements Muscles of Facial Expression: None, normal Lips and Perioral Area: None, normal Jaw: None, normal Tongue: None, normal,Extremity Movements Upper (arms, wrists, hands, fingers): None, normal Lower (legs, knees, ankles, toes): None, normal, Trunk Movements Neck, shoulders, hips: None, normal, Overall Severity Severity of abnormal movements (highest score from questions above): None, normal Incapacitation due to abnormal movements: None, normal Patient's awareness of abnormal movements (rate only patient's report): No Awareness, Dental Status Current problems with teeth and/or dentures?: No Does patient usually wear dentures?: No  CIWA:  CIWA-Ar Total: 0   Musculoskeletal: Strength & Muscle Tone: within normal limits Gait & Station: normal Patient leans: N/A  Psychiatric Specialty Exam:  Presentation  General Appearance:  Fair hygiene, casually dressed   Eye Contact:Good  Speech:Clear and Coherent; Normal  Rate  Speech Volume:Normal  Handedness:Right   Mood and Affect  Mood:aloof appearing but described as improving  Affect:less constricted - brighter affect   Thought Process  Thought Processes:ruminative about discharge plans; more superficially linear and goal directed today  Orientation:Full (Time, Place and Person)  Thought Content:Denies current delusions; appears less guarded today and denies paranoia; denies AVH, ideas of reference or first rank symptoms - is not grossly responding to internal/external stimuli on exam   History of Schizophrenia/Schizoaffective disorder:Yes  Duration of Psychotic Symptoms:Greater than six months  Hallucinations:Denied  Ideas of Reference:None  Suicidal Thoughts:Denied  Homicidal Thoughts:Denied   Sensorium  Memory: Fair   Judgment:Fair - complying with medications  Insight:Improving   Executive Functions  Concentration:Fair  Attention Span:Fair  Recall:Fair  Fund of Knowledge:Fair  Language:Fair   Psychomotor Activity  Psychomotor Activity:Normal - no restlessness or akathisias, no tremor, stiffness, or cogwheeling and AIMS 0   Assets  Assets:Physical Health; Communication Skills; Desire for Improvement   Sleep  Sleep: 7 hours   Physical Exam Vitals and nursing note reviewed.  Constitutional:      Appearance: Normal appearance. He is normal weight.  HENT:     Head: Normocephalic and atraumatic.  Pulmonary:     Effort: Pulmonary effort is normal.  Neurological:     General: No focal deficit present.     Mental Status: He is oriented to person, place, and time.   Review of Systems  Respiratory:  Negative for shortness of breath.   Cardiovascular:  Negative for chest pain.  Gastrointestinal:  Negative for abdominal pain, constipation, diarrhea, heartburn, nausea and vomiting.  Neurological:  Negative for headaches.  Blood pressure 126/84, pulse 79, temperature 97.8 F (36.6 C), temperature source  Oral, resp. rate 18, SpO2 100 %. There is no height or weight on file to calculate BMI.   Treatment Plan Summary: Daily contact with patient to assess and evaluate symptoms and progress in treatment and Medication management  ASSESSMENT Patient is a 20 yo w/ hx of Unspecified schizophrenia spectrum and other psychotic d/o, substance induced psychosis, and marijuana dependence who presents INVOLUNTARILY to [facility] from  [facility] due to increased paranoia that "spirit entered his body through his anus" and that "I needed to get away from those people".  Patient appears guarded but does not endorse delusional thoughts today. He continues to make paranoid references about family. He is clinically improving and has had no acute behavioral issues on the unit.  Patient now requesting to be discharged to homeless shelter in [place] rather than [place].   PLAN Psychiatric Problems Unspecified schizophrenia spectrum and other Psychotic d/o (r/o substance induced psychotic disorder r/o schizophreniform d/o) -Continue Risperdal 2 mg bid for residual paranoia and delusions - Patient refusing LAI at this time  (Qtc 10/27 405, Lipid panel WNLon 01/01/21  and A1c 4.4 on 01/01/21) -Agitation protocol prn -Cogentin 1 mg bid prn for tremors  Cannabis use d/o by hx R/o alcohol use d/o - UDS positive for THC  - Discontinue CIWA based on scores - Continue MVI and thiamine replacement - Encouraged abstinence from illicit substance use    Medical Problems Admission CMP/CBC pending and patient refusing lab draws at this time (CBC from 01/01/21 showed leukocytosis with WBC 10.9 and CMP on 01/01/21 WNL)   PRNs Tylenol 650 mg for mild pain Maalox/Mylanta 30 mL for indigestion Bendaryl q6hr for itching/allergies Hydroxyzine 25 mg tid for anxiety Milk of Magnesia 30 mL for constipation Trazodone 50 mg for sleep   3. Safety and Monitoring: Involuntary admission to inpatient psychiatric unit for safety,  stabilization and treatment Daily contact with patient to assess and evaluate symptoms and progress in treatment Patient's case to be discussed in multi-disciplinary team meeting Observation Level : q15 minute checks Vital signs: q12 hours Precautions: suicide, elopement, and assault   4. Discharge Planning: Social work and case management to assist with discharge planning and identification of hospital follow-up needs prior to discharge Discharge Concerns: Need to establish a safety plan; Medication compliance and effectiveness Discharge Goals: Return home with outpatient referrals for mental health follow-up including medication management/psychotherapy  Park Pope, MD 01/16/2021, 1:15 PM

## 2021-01-16 NOTE — Group Note (Signed)
Group Topic: Goal Setting  Group Date: 01/16/2021 Start Time: 0930 End Time: 1000 Facilitators: Gwenyth Allegra  [facility]  Number of Participants: 5  Group Focus: goals/reality orientation Treatment Modality:  Patient-Centered Therapy Interventions utilized were clarification, orientation, and support Purpose: The purpose of this group is to talk about daily goals, what will be done to obtain those goals, rules of the unit and staff introduction.  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

## 2021-01-16 NOTE — Progress Notes (Signed)
Pt continues to be pleasant and interactive and present in group. Pt currently denies SI/ HI/AVH and pain to this writer during the shift. Pt interacts appropriately with others in the milieu. Pt verbally contracted for safety.   A: Support and encouragement was offered and accepted. Pt medications were administered per order. Safety rounds continued and maintained Q 15 throughout the shift.    R: Safety maintained. Will continue to monitor and assess.      01/16/21 1500  Psych Admission Type (Psych Patients Only)  Admission Status Involuntary  Psychosocial Assessment  Patient Complaints None  Eye Contact Fair  Facial Expression Anxious;Pensive  Affect Anxious;Appropriate to circumstance  Speech Logical/coherent  Interaction Assertive  Motor Activity Slow  Appearance/Hygiene Unremarkable  Behavior Characteristics Appropriate to situation;Cooperative  Mood Pleasant  Thought Administrator, sports thinking  Content WDL  Delusions Controlled  Perception WDL  Hallucination None reported or observed  Judgment Poor  Confusion None  Danger to Self  Current suicidal ideation? Denies  Danger to Others  Danger to Others None reported or observed

## 2021-01-16 NOTE — Group Note (Signed)
Group Topic: Healthy Support Systems  Group Date: 01/16/2021 Start Time: 1000 End Time: 1020 Facilitators: Gwenyth Allegra  [facility]  Number of Participants: 5  Group Focus: Healthy Support Systems  Treatment Modality:  Patient-Centered Therapy Interventions utilized were patient education and support Purpose: enhance coping skills, express feelings, and increase insight  Name: Garrett Bolton Date of Birth: 08/14/2000  MR: [id number]    Level of Participation: active Quality of Participation: attentive and engaged Interactions with others: gave feedback Mood/Affect: appropriate   Patients Problems:  Patient Active Problem List   Diagnosis Date Noted   Episodic cannabis use 02/29/2020   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

## 2021-01-16 NOTE — BHH Group Notes (Signed)
RN Psychoeducation group was held.  ''A hole in my sidewalk'' poem by Karren Burly was read and patients were asked to reflect in identifying negative behavioral patterns, as it relates to the poem. They were then asked to identify ways in which they could change this behavior, or make a different choice.  Pt was pleasant and cooperative, although he did not contribute much. He did identify smoking cigarettes and ''blacks'' and gaming as negative patterns in his life. Healthy choices were explored in group.

## 2021-01-16 NOTE — Group Note (Signed)
LCSW Group Therapy Note   Group Date: 01/16/2021 Start Time: 1100 End Time: 1200   Type of Therapy and Topic:  Group Therapy: Boundaries  Participation Level:  Active  Description of Group: This group will address the use of boundaries in their personal lives. Patients will explore why boundaries are important, the difference between healthy and unhealthy boundaries, and negative and postive outcomes of different boundaries and will look at how boundaries can be crossed.  Patients will be encouraged to identify current boundaries in their own lives and identify what kind of boundary is being set. Facilitators will guide patients in utilizing problem-solving interventions to address and correct types boundaries being used and to address when no boundary is being used. Understanding and applying boundaries will be explored and addressed for obtaining and maintaining a balanced life. Patients will be encouraged to explore ways to assertively make their boundaries and needs known to significant others in their lives, using other group members and facilitator for role play, support, and feedback.  Therapeutic Goals:  1.  Patient will identify areas in their life where setting clear boundaries could be  used to improve their life.  2.  Patient will identify signs/triggers that a boundary is not being respected. 3.  Patient will identify two ways to set boundaries in order to achieve balance in  their lives: 4.  Patient will demonstrate ability to communicate their needs and set boundaries  through discussion and/or role plays  Summary of Patient Progress:  Hezakiah was fully present/active in the early part of the session and proved open to feedback from CSW and peers.  Later, he perceived that the females in the room might not feel comfortable with him so he asked if they wanted him to leave, to which they responded that they wanted him to stay.  A few minutes later, he nonetheless asked permission to  leave and did so.  Patient demonstrated some insight into the subject matter and was respectful of peers.  Therapeutic Modalities:   Cognitive Behavioral Therapy Solution-Focused Therapy  Lynnell Chad, LCSWA 01/16/2021  2:59 PM

## 2021-01-16 NOTE — Progress Notes (Signed)
   01/16/21 0400  Psych Admission Type (Psych Patients Only)  Admission Status Involuntary  Psychosocial Assessment  Patient Complaints None  Eye Contact Fair  Facial Expression Anxious;Pensive  Affect Anxious;Appropriate to circumstance  Speech Logical/coherent  Interaction Assertive  Motor Activity Slow  Appearance/Hygiene Unremarkable  Behavior Characteristics Cooperative;Appropriate to situation  Mood Pleasant  Thought Process  Coherency Concrete thinking  Content WDL  Delusions Controlled  Perception WDL  Hallucination None reported or observed  Judgment Poor  Confusion None  Danger to Self  Current suicidal ideation? Denies  Danger to Others  Danger to Others None reported or observed

## 2021-01-17 ENCOUNTER — Encounter (HOSPITAL_COMMUNITY): Payer: Self-pay

## 2021-01-17 DIAGNOSIS — F29 Unspecified psychosis not due to a substance or known physiological condition: Secondary | ICD-10-CM | POA: Diagnosis not present

## 2021-01-17 NOTE — B[facility] Group Notes (Signed)
[facility] Group Notes:  (Nursing/MHT/Case Management/Adjunct)  Date:  01/17/2021  Time:  6:24 PM  Type of Therapy:  Psychoeducational Skills  Participation Level:  Active  Participation Quality:  Appropriate  Affect:  Appropriate  Cognitive:  Appropriate  Insight:  Appropriate  Engagement in Group:  Engaged  Modes of Intervention:  Discussion, Education, and Support  Summary of Progress/Problems: The topic was wellness, preferably mental wellness. We discussed how it includes our emotional, psychological, and social well-being. It affects how we think, feel, and act. We discussed also helps determine how we handle stress, relate to others, and make healthy choices.  Promiss Labarbera J Tashae Inda 01/17/2021, 6:24 PM

## 2021-01-17 NOTE — Group Note (Signed)
LCSW Group Therapy Note   Group Date: 01/17/2021 Start Time: 1300 End Time: 1400   Type of Therapy and Topic:  Group Therapy: Challenging Core Beliefs  Participation Level:  Active  Description of Group:  Patients were educated about core beliefs and asked to identify one harmful core belief that they have. Patients were asked to explore from where those beliefs originate. Patients were asked to discuss how those beliefs make them feel and the resulting behaviors of those beliefs. They were then be asked if those beliefs are true and, if so, what evidence they have to support them. Lastly, group members were challenged to replace those negative core beliefs with helpful beliefs.   Therapeutic Goals:   1. Patient will identify harmful core beliefs and explore the origins of such beliefs. 2. Patient will identify feelings and behaviors that result from those core beliefs. 3. Patient will discuss whether such beliefs are true. 4.  Patient will replace harmful core beliefs with helpful ones.  Summary of Patient Progress:  Garrett Bolton actively engaged in processing and exploring how core beliefs are formed and how they impact thoughts, feelings, and behaviors. Patient proved open to input from peers and feedback from CSW. Patient demonstrated developing/improving insight into the subject matter, was respectful and supportive of peers, and participated throughout the entire session.  Therapeutic Modalities: Cognitive Behavioral Therapy; Solution-Focused Therapy   Garrett Santee, LCSW 01/17/2021  1:53 PM

## 2021-01-17 NOTE — BHH Counselor (Signed)
Events affecting the discharge plan:  - Patient will discharge tomorrow per Dr. Mason Jim.   Interventions by CSW:  - Sent an ACTT referral to [facility]  - CSW provided this patient with a homeless packet and is to discharge   To the [facility] tomorrow.   Emotional response of the patient/family to the plan of care: - Patient is agreeable to plan and has been actively participating in his treatment.    Ruthann Cancer MSW, LCSW Clincal Social Worker  [facility]

## 2021-01-17 NOTE — BHH Group Notes (Signed)
BH Group Notes:  (Nursing/MT/Case Management/Adjunct)  Date:  01/17/2021  Time:  1:08 AM  Type of Therapy:  Psychoeducational Skills  Participation Level:  Active  Participation Quality:  Appropriate  Affect:  Appropriate  Cognitive:  Appropriate  Insight:  Improving  Engagement in Group:  Developing/Improving  Modes of Intervention:  Education  Summary of Progress/Problems: Patient states that he spoke with his counselor today in order to find out about his discharge date. He spent part of his day watching movies. His goal for tomorrow is to attempt to stay the same .   Garrett Bolton S 01/17/2021, 1:08 AM

## 2021-01-17 NOTE — Progress Notes (Addendum)
[facility] MD Progress Note  01/17/2021 2:45 PM Truxton Goicoechea  MRN:  [id number] Subjective:  Patient is a 20 yo w/ hx of unspecified schizophrenia spectrum d/o, substance induced psychosis, and marijuana dependence who presents INVOLUNTARILY to [facility] from [facility] due to increased paranoia that "spirit entered his body through his anus" and that "I needed to get away from those people".   Chart Review, 24 hr Events: The patient's chart was reviewed and nursing notes were reviewed. The patient's case was discussed in multidisciplinary team meeting.  Per MAR: - Patient is compliant with scheduled meds. - PRNs: none Per RN notes, no documented behavioral issues and is rarely attending group. Patient slept, 6.75 hours  TODAY'S INTERVIEW Patient seen and assessed with attending Dr. Mason Jim.  Patient states that he has had a good day today.  Patient states that his mood has not been changed drastically since yesterday.  Patient feels that he is ready for discharge at this time.  Patient states that he spoke with mother and mother had brought up his marijuana use and her concerns that patient will relapse on marijuana.  Patient states that he has no plans to relapse on marijuana and he wants to "do my own thing and be my own person" after discharge.  Patient still agreeable to going to [facility] homeless shelter at this time.  Discussed with patient that he will need to continue to take Risperdal after discharge and patient states that he will.  Reinforced that psychosis will become a problem should patient continue to use marijuana after discharge or relapse on it and patient verbalized understanding and will do his best to avoid using marijuana.  Discussed with patient that we will continue the plan to discharge him either tomorrow or Wednesday.  Patient verbalized agreement.  Patient initially stated that he would be open to being discharged to his sister's home assuming that this would "speed up my  discharge" but states he would prefer a shelter.  Patient continues to verbalize refusal of Risperdal LAI. Patient denies SI/HI/AVH, ideas of reference, or first rank symptoms. He makes no delusional statements on exam and denies delusional thinking when challenged with delusional beliefs prior to admission.  Principal Problem: Schizophrenia spectrum disorder with psychotic disorder type not yet determined (HCC) Diagnosis: Principal Problem:   Schizophrenia spectrum disorder with psychotic disorder type not yet determined [facility]) Active Problems:   Episodic cannabis use  Total Time spent with patient:  I personally spent 25 minutes on the unit in direct patient care. The direct patient care time included face-to-face time with the patient, reviewing the patient's chart, communicating with other professionals, and coordinating care. Greater than 50% of this time was spent in counseling or coordinating care with the patient regarding goals of hospitalization, psycho-education, and discharge planning needs.   Past Psychiatric History: see H&P  Past Medical History:  Past Medical History:  Diagnosis Date   Anemia    Sickle cell trait (HCC)     Past Surgical History:  Procedure Laterality Date   CIRCUMCISION  2001   LAPAROTOMY N/A 08/23/2019   Procedure: EXPLORATORY LAPAROTOMY;  Surgeon: Violeta Gelinas, MD;  Location: [facility] OR;  Service: General;  Laterality: N/A;   Family History:See H&P  Family Psychiatric  History: see H&P  Social History:  Social History   Substance and Sexual Activity  Alcohol Use Yes     Social History   Substance and Sexual Activity  Drug Use Yes   Types: Methamphetamines, Marijuana   Comment:  Daily    Social History   Socioeconomic History   Marital status: Single    Spouse name: Not on file   Number of children: Not on file   Years of education: Not on file   Highest education level: Not on file  Occupational History   Not on file  Tobacco Use    Smoking status: Some Days    Packs/day: 0.25    Years: 4.00    Pack years: 1.00    Types: Cigars, Cigarettes   Smokeless tobacco: Never  Vaping Use   Vaping Use: Never used  Substance and Sexual Activity   Alcohol use: Yes   Drug use: Yes    Types: Methamphetamines, Marijuana    Comment: Daily   Sexual activity: Never  Other Topics Concern   Not on file  Social History Narrative   Not on file   Social Determinants of Health   Financial Resource Strain: Not on file  Food Insecurity: Not on file  Transportation Needs: Not on file  Physical Activity: Not on file  Stress: Not on file  Social Connections: Not on file     Sleep: Good  Appetite:  Good  Current Medications: Current Facility-Administered Medications  Medication Dose Route Frequency Provider Last Rate Last Admin   acetaminophen (TYLENOL) tablet 650 mg  650 mg Oral Q6H PRN Nwoko, Uchenna E, PA       alum & mag hydroxide-simeth (MAALOX/MYLANTA) 200-200-20 MG/5ML suspension 30 mL  30 mL Oral Q4H PRN Nwoko, Uchenna E, PA       benztropine (COGENTIN) tablet 1 mg  1 mg Oral BID PRN Mason Jim, Zoey Gilkeson E, MD       benztropine mesylate (COGENTIN) injection 1 mg  1 mg Intramuscular BID PRN Comer Locket, MD       feeding supplement (ENSURE ENLIVE / ENSURE PLUS) liquid 237 mL  237 mL Oral BID BM Park Pope, MD   237 mL at 01/17/21 1002   hydrOXYzine (ATARAX/VISTARIL) tablet 25 mg  25 mg Oral Q6H PRN Comer Locket, MD       loperamide (IMODIUM) capsule 2-4 mg  2-4 mg Oral PRN Comer Locket, MD       LORazepam (ATIVAN) tablet 1 mg  1 mg Oral Q6H PRN Mason Jim, Tayra Dawe E, MD       magnesium hydroxide (MILK OF MAGNESIA) suspension 30 mL  30 mL Oral Daily PRN Nwoko, Uchenna E, PA       multivitamin with minerals tablet 1 tablet  1 tablet Oral Daily Mason Jim, Augustine Brannick E, MD   1 tablet at 01/17/21 1001   nicotine (NICODERM CQ - dosed in mg/24 hours) patch 21 mg  21 mg Transdermal Daily Mason Jim, Antaniya Venuti E, MD   21 mg at 01/15/21 0800    ondansetron (ZOFRAN-ODT) disintegrating tablet 4 mg  4 mg Oral Q6H PRN Comer Locket, MD       risperiDONE (RISPERDAL M-TABS) disintegrating tablet 2 mg  2 mg Oral Q8H PRN Park Pope, MD       And   ziprasidone (GEODON) injection 20 mg  20 mg Intramuscular PRN Park Pope, MD       risperiDONE (RISPERDAL M-TABS) disintegrating tablet 2 mg  2 mg Oral BID Park Pope, MD   2 mg at 01/17/21 0959   thiamine tablet 100 mg  100 mg Oral Daily Mason Jim, Devaeh Amadi E, MD   100 mg at 01/17/21 0959   traZODone (DESYREL) tablet 50 mg  50 mg Oral  QHS PRN Meta HatchetNwoko, Uchenna E, PA   50 mg at 01/14/21 2202    Lab Results: No results found for this or any previous visit (from the past 48 hour(s)).  Blood Alcohol level:  Lab Results  Component Value Date   ETH 20 (H) 01/01/2021   ETH <10 02/25/2020    Metabolic Disorder Labs: Lab Results  Component Value Date   HGBA1C 4.4 (L) 01/01/2021   MPG 79.58 01/01/2021   MPG 76.71 04/21/2019   Lab Results  Component Value Date   PROLACTIN 15.2 04/21/2019   Lab Results  Component Value Date   CHOL 161 01/01/2021   TRIG 60 01/01/2021   HDL 59 01/01/2021   CHOLHDL 2.7 01/01/2021   VLDL 12 01/01/2021   LDLCALC 90 01/01/2021   LDLCALC 90 04/21/2019    Physical Findings: AIMS: Facial and Oral Movements Muscles of Facial Expression: None, normal Lips and Perioral Area: None, normal Jaw: None, normal Tongue: None, normal,Extremity Movements Upper (arms, wrists, hands, fingers): None, normal Lower (legs, knees, ankles, toes): None, normal, Trunk Movements Neck, shoulders, hips: None, normal, Overall Severity Severity of abnormal movements (highest score from questions above): None, normal Incapacitation due to abnormal movements: None, normal Patient's awareness of abnormal movements (rate only patient's report): No Awareness, Dental Status Current problems with teeth and/or dentures?: No Does patient usually wear dentures?: No  CIWA:  CIWA-Ar Total:  0   Musculoskeletal: Strength & Muscle Tone: within normal limits Gait & Station: normal Patient leans: N/A  Psychiatric Specialty Exam:  Presentation  General Appearance:  Fair hygiene, casually dressed   Eye Contact:Good  Speech:Clear and Coherent; Normal Rate  Speech Volume:Normal  Handedness:Right   Mood and Affect  Mood:more euthymic  Affect:less constricted - brighter affect, smiles today, polite, calm   Thought Process  Thought Processes:ruminative about discharge plans; more superficially linear and goal directed today  Orientation:Full (Time, Place and Person)  Thought Content:Denies current delusions; appears less guarded today and denies paranoia; denies AVH, ideas of reference or first rank symptoms - is not grossly responding to internal/external stimuli on exam   History of Schizophrenia/Schizoaffective disorder:Yes  Duration of Psychotic Symptoms:Greater than six months  Hallucinations:Denied  Ideas of Reference:None  Suicidal Thoughts:Denied  Homicidal Thoughts:Denied   Sensorium  Memory: Fair   Judgment:Fair - complying with medications  Insight:Improving   Executive Functions  Concentration:Fair  Attention Span:Fair  Recall:Fair  Fund of Knowledge:Fair  Language:Fair   Psychomotor Activity  Psychomotor Activity:Normal - no restlessness or akathisias, no tremor, stiffness, or cogwheeling and AIMS 0   Assets  Assets:Physical Health; Communication Skills; Desire for Improvement   Sleep  Sleep: 6.75 hours   Physical Exam Vitals and nursing note reviewed.  Constitutional:      Appearance: Normal appearance. He is normal weight.  HENT:     Head: Normocephalic and atraumatic.  Pulmonary:     Effort: Pulmonary effort is normal.  Neurological:     General: No focal deficit present.     Mental Status: He is oriented to person, place, and time.   Review of Systems  Respiratory:  Negative for shortness of breath.    Cardiovascular:  Negative for chest pain.  Gastrointestinal:  Negative for abdominal pain, constipation, diarrhea, heartburn, nausea and vomiting.  Neurological:  Negative for headaches.  Blood pressure 126/84, pulse 88, temperature 97.8 F (36.6 C), temperature source Oral, resp. rate 16, SpO2 100 %. There is no height or weight on file to calculate BMI.   Treatment Plan  Summary: Daily contact with patient to assess and evaluate symptoms and progress in treatment and Medication management  ASSESSMENT Patient is a 20 yo w/ hx of Unspecified schizophrenia spectrum and other psychotic d/o, substance induced psychosis, and marijuana dependence who presents INVOLUNTARILY to [facility] from [facility] due to increased paranoia that "spirit entered his body through his anus" and that "I needed to get away from those people".  He is clinically improving and has had no acute behavioral issues on the unit.  Patient's mood has been stable and plan for discharge is tomorrow or Wednesday to a homeless shelter in [place].   PLAN Psychiatric Problems Unspecified schizophrenia spectrum and other Psychotic d/o (r/o substance induced psychotic disorder r/o schizophreniform d/o) -Continue Risperdal 2 mg bid for residual paranoia and delusions - Patient refusing LAI at this time  (Qtc 10/27 405, Lipid panel WNLon 01/01/21  and A1c 4.4 on 01/01/21) -Agitation protocol prn -Cogentin 1 mg bid prn for tremors  Cannabis use d/o by hx R/o alcohol use d/o - UDS positive for THC  - Discontinue CIWA based on scores - Continue MVI and thiamine replacement - Encouraged abstinence from illicit substance use    Medical Problems Admission CMP/CBC pending and patient refusing lab draws at this time (CBC from 01/01/21 showed leukocytosis with WBC 10.9 and CMP on 01/01/21 WNL)   PRNs Tylenol 650 mg for mild pain Maalox/Mylanta 30 mL for indigestion Bendaryl q6hr for itching/allergies Hydroxyzine 25 mg tid for anxiety Milk  of Magnesia 30 mL for constipation Trazodone 50 mg for sleep   3. Safety and Monitoring: Involuntary admission to inpatient psychiatric unit for safety, stabilization and treatment Daily contact with patient to assess and evaluate symptoms and progress in treatment Patient's case to be discussed in multi-disciplinary team meeting Observation Level : q15 minute checks Vital signs: q12 hours Precautions: suicide, elopement, and assault   4. Discharge Planning: Social work and case management to assist with discharge planning and identification of hospital follow-up needs prior to discharge Discharge Concerns: Need to establish a safety plan; Medication compliance and effectiveness Discharge Goals: Return home with outpatient referrals for mental health follow-up including medication management/psychotherapy  France Ravens, MD 01/17/2021, 2:45 PM

## 2021-01-17 NOTE — BHH Group Notes (Signed)
BHH Group Notes:  (Nursing/MHT/Case Management/Adjunct)  Date:  01/17/2021  Time:  4:23 PM  Type of Therapy:   Therapeutic Relaxation group  Participation Level:  Active  Participation Quality:  Appropriate  Affect:  Appropriate  Cognitive:  Appropriate  Insight:  Appropriate  Engagement in Group:  Engaged  Modes of Intervention:  Activity and Exploration  Summary of Progress/Problems: Group consisted of soothing and relaxing music of waterfalls and rainforest sounds as well as visualization exercises.   Terica Yogi J Zannie Runkle 01/17/2021, 4:23 PM

## 2021-01-17 NOTE — BH IP Treatment Plan (Signed)
Interdisciplinary Treatment and Diagnostic Plan Update  01/17/2021 Time of Session: 11:00am Monroe Qin MRN: [id number]  Principal Diagnosis: Schizophrenia spectrum disorder with psychotic disorder type not yet determined [facility])  Secondary Diagnoses: Principal Problem:   Schizophrenia spectrum disorder with psychotic disorder type not yet determined (HCC) Active Problems:   Episodic cannabis use   Current Medications:  Current Facility-Administered Medications  Medication Dose Route Frequency Provider Last Rate Last Admin   acetaminophen (TYLENOL) tablet 650 mg  650 mg Oral Q6H PRN Nwoko, Uchenna E, PA       alum & mag hydroxide-simeth (MAALOX/MYLANTA) 200-200-20 MG/5ML suspension 30 mL  30 mL Oral Q4H PRN Nwoko, Uchenna E, PA       benztropine (COGENTIN) tablet 1 mg  1 mg Oral BID PRN Mason Jim, Amy E, MD       benztropine mesylate (COGENTIN) injection 1 mg  1 mg Intramuscular BID PRN Comer Locket, MD       feeding supplement (ENSURE ENLIVE / ENSURE PLUS) liquid 237 mL  237 mL Oral BID BM Park Pope, MD   237 mL at 01/17/21 1002   hydrOXYzine (ATARAX/VISTARIL) tablet 25 mg  25 mg Oral Q6H PRN Comer Locket, MD       loperamide (IMODIUM) capsule 2-4 mg  2-4 mg Oral PRN Comer Locket, MD       LORazepam (ATIVAN) tablet 1 mg  1 mg Oral Q6H PRN Mason Jim, Amy E, MD       magnesium hydroxide (MILK OF MAGNESIA) suspension 30 mL  30 mL Oral Daily PRN Nwoko, Uchenna E, PA       multivitamin with minerals tablet 1 tablet  1 tablet Oral Daily Mason Jim, Amy E, MD   1 tablet at 01/17/21 1001   nicotine (NICODERM CQ - dosed in mg/24 hours) patch 21 mg  21 mg Transdermal Daily Mason Jim, Amy E, MD   21 mg at 01/15/21 0800   ondansetron (ZOFRAN-ODT) disintegrating tablet 4 mg  4 mg Oral Q6H PRN Comer Locket, MD       risperiDONE (RISPERDAL M-TABS) disintegrating tablet 2 mg  2 mg Oral Q8H PRN Park Pope, MD       And   ziprasidone (GEODON) injection 20 mg  20 mg Intramuscular  PRN Park Pope, MD       risperiDONE (RISPERDAL M-TABS) disintegrating tablet 2 mg  2 mg Oral BID Park Pope, MD   2 mg at 01/17/21 8657   thiamine tablet 100 mg  100 mg Oral Daily Mason Jim, Amy E, MD   100 mg at 01/17/21 8469   traZODone (DESYREL) tablet 50 mg  50 mg Oral QHS PRN Nwoko, Uchenna E, PA   50 mg at 01/14/21 2202   PTA Medications: Medications Prior to Admission  Medication Sig Dispense Refill Last Dose   hydrOXYzine (ATARAX/VISTARIL) 25 MG tablet Take 1 tablet (25 mg total) by mouth 3 (three) times daily as needed for anxiety. 30 tablet 0    risperiDONE (RISPERDAL M-TABS) 1 MG disintegrating tablet Take 1 tablet (1 mg total) by mouth 2 (two) times daily as needed (psychosis and agitated).      risperiDONE (RISPERDAL M-TABS) 2 MG disintegrating tablet Take 1 tablet (2 mg total) by mouth at bedtime.      traZODone (DESYREL) 50 MG tablet Take 1 tablet (50 mg total) by mouth at bedtime as needed for sleep.       Patient Stressors:    Patient Strengths:    Treatment Modalities:  Medication Management, Group therapy, Case management,  1 to 1 session with clinician, Psychoeducation, Recreational therapy.   Physician Treatment Plan for Primary Diagnosis: Schizophrenia spectrum disorder with psychotic disorder type not yet determined ([place]) Long Term Goal(s): Improvement in symptoms so as ready for discharge   Short Term Goals: Ability to identify changes in lifestyle to reduce recurrence of condition will improve Ability to verbalize feelings will improve Ability to disclose and discuss suicidal ideas Ability to demonstrate self-control will improve Ability to identify and develop effective coping behaviors will improve Ability to maintain clinical measurements within normal limits will improve Ability to identify triggers associated with substance abuse/mental health issues will improve  Medication Management: Evaluate patient's response, side effects, and tolerance of  medication regimen.  Therapeutic Interventions: 1 to 1 sessions, Unit Group sessions and Medication administration.  Evaluation of Outcomes: Progressing  Physician Treatment Plan for Secondary Diagnosis: Principal Problem:   Schizophrenia spectrum disorder with psychotic disorder type not yet determined ([place]) Active Problems:   Episodic cannabis use  Long Term Goal(s): Improvement in symptoms so as ready for discharge   Short Term Goals: Ability to identify changes in lifestyle to reduce recurrence of condition will improve Ability to verbalize feelings will improve Ability to disclose and discuss suicidal ideas Ability to demonstrate self-control will improve Ability to identify and develop effective coping behaviors will improve Ability to maintain clinical measurements within normal limits will improve Ability to identify triggers associated with substance abuse/mental health issues will improve     Medication Management: Evaluate patient's response, side effects, and tolerance of medication regimen.  Therapeutic Interventions: 1 to 1 sessions, Unit Group sessions and Medication administration.  Evaluation of Outcomes: Progressing   RN Treatment Plan for Primary Diagnosis: Schizophrenia spectrum disorder with psychotic disorder type not yet determined ([place]) Long Term Goal(s): Knowledge of disease and therapeutic regimen to maintain health will improve  Short Term Goals: Ability to remain free from injury will improve, Ability to verbalize frustration and anger appropriately will improve, Ability to demonstrate self-control, Ability to participate in decision making will improve, Ability to identify and develop effective coping behaviors will improve, and Compliance with prescribed medications will improve  Medication Management: RN will administer medications as ordered by provider, will assess and evaluate patient's response and provide education to patient for prescribed  medication. RN will report any adverse and/or side effects to prescribing provider.  Therapeutic Interventions: 1 on 1 counseling sessions, Psychoeducation, Medication administration, Evaluate responses to treatment, Monitor vital signs and CBGs as ordered, Perform/monitor CIWA, COWS, AIMS and Fall Risk screenings as ordered, Perform wound care treatments as ordered.  Evaluation of Outcomes: Progressing   LCSW Treatment Plan for Primary Diagnosis: Schizophrenia spectrum disorder with psychotic disorder type not yet determined ([place]) Long Term Goal(s): Safe transition to appropriate next level of care at discharge, Engage patient in therapeutic group addressing interpersonal concerns.  Short Term Goals: Engage patient in aftercare planning with referrals and resources, Increase social support, Increase ability to appropriately verbalize feelings, Identify triggers associated with mental health/substance abuse issues, and Increase skills for wellness and recovery  Therapeutic Interventions: Assess for all discharge needs, 1 to 1 time with Social worker, Explore available resources and support systems, Assess for adequacy in community support network, Educate family and significant other(s) on suicide prevention, Complete Psychosocial Assessment, Interpersonal group therapy.  Evaluation of Outcomes: Progressing   Progress in Treatment: Attending groups: Yes. Participating in groups: Yes. Taking medication as prescribed: Yes. Toleration medication: Yes. Family/Significant other  contact made: No, will contact:  declined consents Patient understands diagnosis: No. Discussing patient identified problems/goals with staff: Yes. Medical problems stabilized or resolved: Yes. Denies suicidal/homicidal ideation: Yes. Issues/concerns per patient self-inventory: No.   New problem(s) identified: No, Describe:  none  New Short Term/Long Term Goal(s): medication stabilization, elimination of SI  thoughts, development of comprehensive mental wellness plan.    Patient Goals:  "To go to a shelter in [place]"   Discharge Plan or Barriers: Patient is interested in staying at a shelter in [place]. Pt will be referred to ACTT.   Reason for Continuation of Hospitalization: Anxiety Delusions  Depression Medication stabilization  Estimated Length of Stay: 1-3 days   Scribe for Treatment Team: Vassie Moselle, LCSW 01/17/2021 11:59 AM

## 2021-01-17 NOTE — Progress Notes (Signed)
   01/17/21 1200  Psych Admission Type (Psych Patients Only)  Admission Status Involuntary  Psychosocial Assessment  Patient Complaints None  Eye Contact Fair  Facial Expression Anxious;Pensive  Affect Anxious;Appropriate to circumstance  Speech Logical/coherent  Interaction Assertive  Motor Activity Slow  Appearance/Hygiene Unremarkable  Behavior Characteristics Appropriate to situation  Mood Pleasant  Thought Process  Coherency Concrete thinking  Content WDL  Delusions Controlled  Perception WDL  Hallucination None reported or observed  Judgment Poor  Confusion None  Danger to Self  Current suicidal ideation? Denies  Danger to Others  Danger to Others None reported or observed

## 2021-01-17 NOTE — [facility] Group Notes (Signed)
[facility] Group Notes:  (Nursing/MHT/Case Management/Adjunct)  Date:  01/17/2021  Time:  10:50 AM  Type of Therapy:   Orientation/Goals group  Participation Level:  Active  Participation Quality:  Appropriate  Affect:  Appropriate  Cognitive:  Alert and Appropriate  Insight:  Appropriate and Good  Engagement in Group:  Engaged  Modes of Intervention:  Discussion, Education, and Orientation  Summary of Progress/Problems: Pt goal for today is to find out more about his discharge plan.  Garrett Bolton J Garrett Bolton 01/17/2021, 10:50 AM

## 2021-01-17 NOTE — Progress Notes (Signed)
   01/16/21 1500  Psych Admission Type (Psych Patients Only)  Admission Status Involuntary  Psychosocial Assessment  Patient Complaints None  Eye Contact Fair  Facial Expression Anxious;Pensive  Affect Anxious;Appropriate to circumstance  Speech Logical/coherent  Interaction Assertive  Motor Activity Slow  Appearance/Hygiene Unremarkable  Behavior Characteristics Appropriate to situation;Cooperative  Mood Pleasant  Thought Administrator, sports thinking  Content WDL  Delusions Controlled  Perception WDL  Hallucination None reported or observed  Judgment Poor  Confusion None  Danger to Self  Current suicidal ideation? Denies  Danger to Others  Danger to Others None reported or observed

## 2021-01-17 NOTE — B[facility] Group Notes (Addendum)
[facility] Group Notes:  (Nursing/MHT/Case Management/Adjunct)  Date:  01/17/2021  Time:  10:12 PM  Type of Therapy:   Wrap up group  Participation Level:  Active  Participation Quality:  Attentive   Affect:  Flat  Cognitive:  Alert  Insight:  Limited  Engagement in Group:  Good  Modes of Intervention:  Discussion and Education  Summary of Progress/Problems:  Galdino reported his day was good because he was able to talked to his mother.  Goal for tomorrow "hopefully go to placement." Norm Parcel Amiere Cawley 01/17/2021, 10:12 PM

## 2021-01-18 MED ORDER — THIAMINE HCL 100 MG PO TABS: 100.0000 mg | ORAL_TABLET | Freq: Every day | ORAL | 0 refills | Status: AC

## 2021-01-18 MED ORDER — RISPERIDONE 2 MG PO TBDP: 2.0000 mg | ORAL_TABLET | Freq: Two times a day (BID) | ORAL | 0 refills | Status: DC

## 2021-01-18 NOTE — Group Note (Signed)
Recreation Therapy Group Note   Group Topic:Self-Esteem  Group Date: 01/18/2021 Start Time: 1000 End Time: 1035 Facilitators: Caroll Rancher, LRT/CTRS Location: [address] Dayroom  Goal Area(s) Addresses:  Patient will be able to identify what influences self esteem. Patient will be able to identify positive traits about themselves. Patient will identify importance of having positive self esteem.    Group Description: Patients and LRT discussed what influences self esteem.  Patients and LRT talked about how internal and external factors can influence how one feels about themselves.  Patients were then given a picture of a blank face, and told to illustrate and describe how they see themselves. Patients were given colored pencils, markers, and crayons to complete the assignment. Patients shared their completed assignment with each other.  Patients were also asked to identify how others see them.  Patients and LRT debriefed on the importance of having confidence in ones self and not letting the negative outshine the positive.   Affect/Mood: Happy   Participation Level: Engaged   Participation Quality: Independent   Behavior: Appropriate   Speech/Thought Process: Focused   Insight: Good   Judgement: Good   Modes of Intervention: Art   Patient Response to Interventions:  Engaged   Education Outcome:  Acknowledges education and In group clarification offered    Clinical Observations/Individualized Feedback: Pt was bright and engaged during activity.  Pt identified himself as being positive no matter the situation, lonely at times when dealing with things to try and work them out within himself.  Pt expressed others see him as a cool person to talk to.  Pt also explained using his bright appearance as a shield sometimes when he's dealing with things.     Plan: Continue to engage patient in RT group sessions 2-3x/week.   Caroll Rancher, LRT/CTRS 01/18/2021 12:30 PM

## 2021-01-18 NOTE — [facility] Suicide Risk Assessment (Signed)
[facility] Discharge Suicide Risk Assessment   Principal Problem: Schizophrenia spectrum disorder with psychotic disorder type not yet determined [facility]) Discharge Diagnoses: Principal Problem:   Schizophrenia spectrum disorder with psychotic disorder type not yet determined (HCC) Active Problems:   Episodic cannabis use   Total Time spent with patient: 15 minutes  Musculoskeletal: Strength & Muscle Tone: within normal limits Gait & Station: normal Patient leans: N/A  Psychiatric Specialty Exam  Presentation  General Appearance: Casual  Eye Contact:Minimal  Speech:Clear and Coherent; Normal Rate  Speech Volume:Normal  Handedness:Right   Mood and Affect  Mood:Euthymic  Duration of Depression Symptoms: Greater than two weeks  Affect:Flat   Thought Process  Thought Processes:Coherent; Goal Directed; Linear  Descriptions of Associations:Intact  Orientation:Full (Time, Place and Person)  Thought Content:Logical  History of Schizophrenia/Schizoaffective disorder:Yes  Duration of Psychotic Symptoms:Greater than six months  Hallucinations:No data recorded Ideas of Reference:None  Suicidal Thoughts:No data recorded Homicidal Thoughts:No data recorded  Sensorium  Memory:Immediate Good; Recent Good; Remote Good  Judgment:Impaired  Insight:Lacking   Executive Functions  Concentration:Fair  Attention Span:Fair  Recall:Fair  Fund of Knowledge:Fair  Language:Fair   Psychomotor Activity  Psychomotor Activity:No data recorded  Assets  Assets:Physical Health; Manufacturing systems engineer; Desire for Improvement   Sleep  Sleep:No data recorded  Physical Exam: Physical Exam HENT:     Head: Normocephalic.  Eyes:     Extraocular Movements: Extraocular movements intact.  Musculoskeletal:        General: Normal range of motion.     Cervical back: Normal range of motion.  Neurological:     Mental Status: He is alert. Mental status is at baseline.  Psychiatric:         Attention and Perception: Attention normal.        Mood and Affect: Affect is blunt.        Speech: Speech is delayed.        Behavior: Behavior is slowed. Behavior is cooperative.        Thought Content: Thought content is not paranoid or delusional (denies). Thought content does not include homicidal or suicidal ideation.        Cognition and Memory: Cognition is not impaired. Memory is not impaired. He does not exhibit impaired recent memory or impaired remote memory.        Judgment: Judgment normal.   ROS Blood pressure 120/83, pulse (!) 108, temperature 97.9 F (36.6 C), temperature source Oral, resp. rate 18, SpO2 100 %. There is no height or weight on file to calculate BMI.  Mental Status Per Nursing Assessment::   On Admission:  NA  Demographic Factors:  Male, Adolescent or young adult, Low socioeconomic status, and Unemployed  Loss Factors: Decrease in vocational status and Financial problems/change in socioeconomic status  Historical Factors: previous psychiatric diagnoses/treatments, medication noncompliance, h/o substance abuse  Risk Reduction Factors:   Employed  Continued Clinical Symptoms:  Alcohol/Substance Abuse/Dependencies  Cognitive Features That Contribute To Risk:  Loss of executive function    Suicide Risk:  Minimal: No identifiable suicidal ideation.  Patients presenting with no risk factors but with morbid ruminations; may be classified as minimal risk based on the severity of the depressive symptoms   Follow-up Information     [facility] Follow up.   Why: An ACTT referral has been made on your behalf. Please call to schedule an appoitnment. Contact information: [address] [place] [address] [phone]  Plan Of Care/Follow-up recommendations:  Activity:  as tolerated  Prescriptions for new medications provided for the patient to bridge to follow up appointment. The patient was  informed that refills for these prescriptions are generally not provided, and patient is encouraged to attend all follow up appointments to address medication refills and adjustments.   Today's discharge was reviewed with treatment team, and the team is in agreement that the patient is ready for discharge. The patient is was of the discharge plan for today and has been given opportunity to ask questions. At time of discharge, the patient does not vocalize any acute harm to self or others, is goal directed, able to advocate for self and organizational baseline.   At discharge, the patient is instructed to:  Take all medications as prescribed. Report any adverse effects and or reactions from the medicines to her outpatient provider promptly.  Do not engage in alcohol and/or illegal drug use while on prescription medicines.  In the event of worsening symptoms, patient is instructed to call the crisis hotline, 911 and or go to the nearest ED for appropriate evaluation and treatment of symptoms.  Follow-up with primary care provider for further care of medical issues, concerns and or health care needs.   Roselle Locus, MD 01/18/2021, 10:07 AM

## 2021-01-18 NOTE — Progress Notes (Signed)
Recreation Therapy Notes  INPATIENT RECREATION TR PLAN  Patient Details Name: Garrett Bolton MRN: [id number] DOB: 07/30/2000 Today's Date: 01/18/2021  Rec Therapy Plan Is patient appropriate for Therapeutic Recreation?: Yes Treatment times per week: about 3 days Estimated Length of Stay: 5-7 days TR Treatment/Interventions: Group participation (Comment)  Discharge Criteria Pt will be discharged from therapy if:: Discharged Treatment plan/goals/alternatives discussed and agreed upon by:: Patient/family  Discharge Summary Short term goals set: See care plan Short term goals met: Complete Progress toward goals comments: Groups attended Which groups?: Self-esteem, Other (Comment) (Personal Development) Reason goals not met: None Therapeutic equipment acquired: N/A Reason patient discharged from therapy: Discharge from hospital Pt/family agrees with progress & goals achieved: Yes Date patient discharged from therapy: 01/18/21     , LRT/CTRS ,  A 01/18/2021, 1:08 PM 

## 2021-01-18 NOTE — Plan of Care (Signed)
Patient engaged in groups with a calm and appropriate mood by completion of recreation therapy group sessions.   Caroll Rancher, LRT/CTRS

## 2021-01-18 NOTE — Progress Notes (Signed)
  [facility] Adult Case Management Discharge Plan :  Will you be returning to the same living situation after discharge:  No. Will be discharged to shelter At discharge, do you have transportation home?: No. Safe Transport will be arranged Do you have the ability to pay for your medications: Yes,  has insurance  Release of information consent forms completed and in the chart;  Patient's signature needed at discharge.  Patient to Follow up at:  Follow-up Information     [facility] Follow up.   Why: An ACTT referral has been made on your behalf. Please call to schedule an appoitnment. Contact information: [address] [place] [address] [phone]                 Next level of care provider has access to [facility] Link:no  Safety Planning and Suicide Prevention discussed: Yes,  with patient     Has patient been referred to the Quitline?: Patient refused referral  Patient has been referred for addiction treatment: Pt. refused referral  Otelia Santee, LCSW 01/18/2021, 10:24 AM

## 2021-01-18 NOTE — [facility] Group Notes (Signed)
[facility] Group Notes:  (Nursing/MHT/Case Management/Adjunct)  Date:  01/18/2021  Time:  10:25 AM  Type of Therapy:   Orientation/Goals group  Participation Level:  Active  Participation Quality:  Appropriate  Affect:  Appropriate  Cognitive:  Appropriate  Insight:  Appropriate  Engagement in Group:  Engaged  Modes of Intervention:  Discussion and Orientation  Summary of Progress/Problems: Pt goal for today is to get discharged to a shelter and be more independent.   Garrett Bolton J Suhaan Perleberg 01/18/2021, 10:25 AM

## 2021-01-18 NOTE — Progress Notes (Signed)
  Patient requested for trazodone for sleep tonight, He is worried continues to talk about his discharge to the homeless shelter. Patient has been awake reading his bible in bed. Denies any PRN medications. Patient went back to sleep after about an hour. Support and encouragement provided.
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
## 2021-01-18 NOTE — Progress Notes (Signed)
Pt discharged to lobby. Pt was stable and appreciative at that time. All papers and prescriptions were given and valuables returned. Verbal understanding expressed. Denies SI/HI and A/VH. Pt given opportunity to express concerns and ask questions.  

## 2021-01-31 ENCOUNTER — Encounter (HOSPITAL_COMMUNITY): Payer: Self-pay

## 2021-01-31 ENCOUNTER — Other Ambulatory Visit (HOSPITAL_COMMUNITY): Payer: Self-pay | Admitting: Emergency Medicine

## 2021-01-31 ENCOUNTER — Emergency Department (HOSPITAL_COMMUNITY)
Admission: EM | Admit: 2021-01-31 | Discharge: 2021-01-31 | Disposition: A | Discharge status: Home / Self Care | Payer: Federal, State, Local not specified - PPO | Attending: Emergency Medicine | Admitting: Emergency Medicine

## 2021-01-31 ENCOUNTER — Other Ambulatory Visit (HOSPITAL_COMMUNITY): Payer: Self-pay

## 2021-01-31 DIAGNOSIS — Z760 Encounter for issue of repeat prescription: Secondary | ICD-10-CM | POA: Insufficient documentation

## 2021-01-31 DIAGNOSIS — F17210 Nicotine dependence, cigarettes, uncomplicated: Secondary | ICD-10-CM | POA: Insufficient documentation

## 2021-01-31 MED ORDER — RISPERIDONE 0.5 MG PO TBDP
2.0000 mg | ORAL_TABLET | Freq: Once | ORAL | Status: AC
  Administered 2021-01-31: 2 mg via ORAL
  Filled 2021-01-31: qty 4

## 2021-01-31 MED ORDER — RISPERIDONE 2 MG PO TBDP
2.0000 mg | ORAL_TABLET | Freq: Two times a day (BID) | ORAL | 0 refills | Status: DC
  Filled 2021-01-31: qty 60, 30d supply, fill #0

## 2021-01-31 NOTE — ED Provider Notes (Signed)
[facility]ENCY [facility]PT Provider Note   CSN: [id number] Arrival date & time: 01/31/21  8242     History Chief Complaint  Patient presents with   Medication Refill    Garrett Bolton is a 20 y.o. male.  Patient with history of schizophrenia, currently taking Risperdal, recent [facility] admission --presents to the emergency department for a dose of Risperdal.  Patient states that he is able to pick up his prescription on Friday when he gets his check.  Does not currently have the medication.  Denies worsening hallucinations.  Denies other medical complaints.  Transported today by EMS.      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 No pertinent family history.  Social History   Tobacco Use   Smoking status: Some Days    Packs/day: 0.25    Years: 4.00    Pack years: 1.00    Types: Cigars, Cigarettes   Smokeless tobacco: Never  Vaping Use   Vaping Use: Never used  Substance Use Topics   Alcohol use: Yes   Drug use: Yes    Types: Methamphetamines, Marijuana    Comment: Daily    Home Medications Prior to Admission medications   Medication Sig Start Date End Date Taking? Authorizing Provider  hydrOXYzine (ATARAX/VISTARIL) 25 MG tablet Take 1 tablet (25 mg total) by mouth 3 (three)  times daily as needed for anxiety. 01/11/21   Rankin, Shuvon B, NP  risperiDONE (RISPERDAL M-TABS) 2 MG disintegrating tablet Take 1 tablet (2 mg total) by mouth 2 (two) times daily. 01/18/21 02/17/21  Roselle Locus, MD  thiamine 100 MG tablet Take 1 tablet (100 mg total) by mouth daily. 01/19/21 02/18/21  Roselle Locus, MD  traZODone (DESYREL) 50 MG tablet Take 1 tablet (50 mg total) by mouth at bedtime as needed for sleep. 01/11/21   Rankin, Shuvon B, NP    Allergies    Patient has no known allergies.  Review of Systems   Review of Systems  Constitutional:  Negative for fever.  Gastrointestinal:  Negative for nausea and vomiting.  Neurological:  Negative for headaches.  Psychiatric/Behavioral:  Negative for agitation, hallucinations and suicidal ideas.    Physical Exam Updated Vital Signs BP 122/84 (BP Location: Left Arm)   Pulse 74   Temp 98 F (36.7 C) (Oral)   Resp 20   SpO2 100%   Physical Exam Vitals and nursing note reviewed.  Constitutional:      Appearance: He is well-developed.  HENT:     Head: Normocephalic and atraumatic.  Eyes:     Conjunctiva/sclera: Conjunctivae normal.  Pulmonary:     Effort: No respiratory distress.  Musculoskeletal:     Cervical back: Normal range of motion and neck supple.  Skin:    General: Skin is warm and dry.  Neurological:     Mental Status: He is alert.  Psychiatric:        Attention and Perception: Attention normal. He does not perceive auditory or visual hallucinations.        Mood and Affect: Affect is flat.        Speech: Speech normal.        Behavior: Behavior normal.        Thought Content: Thought content does not include homicidal or suicidal ideation.    ED Results / Procedures / Treatments   Labs (all labs ordered are listed, but only abnormal results are displayed) Labs Reviewed - No data to display  EKG None  Radiology No results found.  Procedures Procedures   Medications Ordered in  ED Medications  risperiDONE (RISPERDAL M-TABS) disintegrating tablet 2 mg (2 mg Oral Given 01/31/21 1212)    ED Course  I have reviewed the triage vital signs and the nursing notes.  Pertinent labs & imaging results that were available during my care of the patient were reviewed by me and considered in my medical decision making (see chart for details).  Patient seen and examined.  Has not been able to take his psych medications.  He tells me that he will be able to get his medication 4 days.  Will request help from [facility] with medications.  He does not appear significantly decompensated at this point.  Vital signs reviewed and are as follows: BP 122/84 (BP Location: Left Arm)   Pulse 74   Temp 98 F (36.7 C) (Oral)   Resp 20   SpO2 100%   Case management is making arrangements to pick up medications here.  I sent prescription to [facility] outpatient pharmacy.  Plan to discharge once patient has his medications.     MDM Rules/Calculators/A&P                           Patient presents by EMS today for dose of his home Risperdal.  He did is not able to get his medications.  TOC assisting today.  Patient appears compensated in regards to his underlying psychiatric illness today.  Does not appear to be responding to internal stimuli.   Final Clinical Impression(s) / ED Diagnoses Final diagnoses:  Medication refill    Rx / DC Orders ED Discharge Orders          Ordered    risperiDONE (RISPERDAL M-TABS) 2 MG disintegrating tablet  2 times daily        01/31/21 1217             Renne Crigler, PA-C 01/31/21 1220    Virgina Norfolk, DO 01/31/21 1456

## 2021-01-31 NOTE — Progress Notes (Signed)
.  Transition of Care [facility]) - Emergency Department Mini Assessment   Patient Details  Name: Garrett Bolton MRN: [id number] Date of Birth: October 06, 2000  Transition of Care [facility]) CM/SW Contact:    Larrie Kass, LCSW Phone Number: 01/31/2021, 12:25 PM   Clinical Narrative:  CSW sopke with pt, he stated work has been slow and he has not has the funds to pick up medication. Pt stated he can get meds Friday and is needing assistance until then. CSW inform pt his medication will be send to [facility] patient Pharmacy, they will send a bill, he will need to pick up. CSW provided pt with bus pass for ride home.   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 [place] [address] Phone: [phone] Fax: [phone]

## 2021-01-31 NOTE — ED Triage Notes (Addendum)
Pt arrived via EMS, from home, states he is just here for risperidone. Has active prescription and has been unable to pick up.

## 2021-02-03 ENCOUNTER — Other Ambulatory Visit (HOSPITAL_COMMUNITY): Payer: Self-pay

## 2021-02-04 ENCOUNTER — Other Ambulatory Visit (HOSPITAL_COMMUNITY): Payer: Self-pay

## 2021-02-21 ENCOUNTER — Ambulatory Visit (HOSPITAL_COMMUNITY)
Admission: EM | Admit: 2021-02-21 | Discharge: 2021-02-21 | Disposition: A | Discharge status: Home / Self Care | Payer: Federal, State, Local not specified - PPO

## 2021-02-21 DIAGNOSIS — F259 Schizoaffective disorder, unspecified: Secondary | ICD-10-CM | POA: Diagnosis not present
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
## 2021-02-21 NOTE — Discharge Instructions (Signed)
Take all medications as prescribed. Keep all follow-up appointments as scheduled.  Do not consume alcohol or use illegal drugs while on prescription medications. Report any adverse effects from your medications to your primary care provider promptly.  In the event of recurrent symptoms or worsening symptoms, call 911, a crisis hotline, or go to the nearest emergency department for evaluation.   

## 2021-02-21 NOTE — Progress Notes (Signed)
AVS reviewed with patient by provider.  Patient discharged in stable in condition; no acute distress.
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
## 2021-02-28 ENCOUNTER — Telehealth (HOSPITAL_COMMUNITY): Payer: Self-pay | Admitting: Pediatrics

## 2021-02-28 NOTE — BH Assessment (Signed)
Care Management [facility] Discharge Follow Up  Writer attempted to make contact with patient today and was unsuccessful.  Patient phone is disconnected.   Per chart review, patient was provided with outpatient resources.

## 2021-03-07 ENCOUNTER — Other Ambulatory Visit: Payer: Self-pay

## 2021-03-07 ENCOUNTER — Ambulatory Visit (HOSPITAL_COMMUNITY)
Admission: EM | Admit: 2021-03-07 | Discharge: 2021-03-07 | Disposition: A | Discharge status: Home / Self Care | Payer: Federal, State, Local not specified - PPO | Attending: Urology | Admitting: Urology

## 2021-03-07 DIAGNOSIS — F29 Unspecified psychosis not due to a substance or known physiological condition: Secondary | ICD-10-CM

## 2021-03-07 DIAGNOSIS — F209 Schizophrenia, unspecified: Secondary | ICD-10-CM | POA: Insufficient documentation

## 2021-03-07 DIAGNOSIS — Z9114 Patient's other noncompliance with medication regimen: Secondary | ICD-10-CM | POA: Insufficient documentation

## 2021-03-07 DIAGNOSIS — Z20822 Contact with and (suspected) exposure to covid-19: Secondary | ICD-10-CM | POA: Diagnosis not present

## 2021-03-07 LAB — POCT URINE DRUG SCREEN - MANUAL ENTRY (I-SCREEN)
POC Amphetamine UR: NOT DETECTED
POC Buprenorphine (BUP): NOT DETECTED
POC Cocaine UR: NOT DETECTED
POC Marijuana UR: NOT DETECTED
POC Methadone UR: NOT DETECTED
POC Methamphetamine UR: NOT DETECTED
POC Morphine: NOT DETECTED
POC Oxazepam (BZO): NOT DETECTED
POC Oxycodone UR: NOT DETECTED
POC Secobarbital (BAR): NOT DETECTED

## 2021-03-07 LAB — RESP PANEL BY RT-PCR (FLU A&B, COVID) ARPGX2
Influenza A by PCR: NEGATIVE
Influenza B by PCR: NEGATIVE
SARS Coronavirus 2 by RT PCR: NEGATIVE

## 2021-03-07 LAB — POC SARS CORONAVIRUS 2 AG: SARSCOV2ONAVIRUS 2 AG: NEGATIVE

## 2021-03-07 LAB — POC SARS CORONAVIRUS 2 AG -  ED: SARS Coronavirus 2 Ag: NEGATIVE

## 2021-03-07 MED ORDER — HYDROXYZINE HCL 25 MG PO TABS: 25.0000 mg | ORAL_TABLET | Freq: Three times a day (TID) | ORAL | Status: DC | PRN

## 2021-03-07 MED ORDER — PALIPERIDONE PALMITATE ER 156 MG/ML IM SUSY
156.0000 mg | PREFILLED_SYRINGE | Freq: Once | INTRAMUSCULAR | Status: AC
  Administered 2021-03-07: 156 mg via INTRAMUSCULAR
  Filled 2021-03-07: qty 1

## 2021-03-07 MED ORDER — ACETAMINOPHEN 325 MG PO TABS: 650.0000 mg | ORAL_TABLET | Freq: Four times a day (QID) | ORAL | Status: DC | PRN

## 2021-03-07 MED ORDER — ALUM & MAG HYDROXIDE-SIMETH 200-200-20 MG/5ML PO SUSP: 30.0000 mL | ORAL | Status: DC | PRN

## 2021-03-07 MED ORDER — RISPERIDONE 2 MG PO TBDP
2.0000 mg | ORAL_TABLET | Freq: Two times a day (BID) | ORAL | Status: DC
  Administered 2021-03-07: 2 mg via ORAL
  Filled 2021-03-07: qty 1

## 2021-03-07 MED ORDER — RISPERIDONE 2 MG PO TBDP: 2.0000 mg | ORAL_TABLET | Freq: Two times a day (BID) | ORAL | Status: DC

## 2021-03-07 MED ORDER — TRAZODONE HCL 50 MG PO TABS: 50.0000 mg | ORAL_TABLET | Freq: Every evening | ORAL | Status: DC | PRN

## 2021-03-07 MED ORDER — MAGNESIUM HYDROXIDE 400 MG/5ML PO SUSP: 30.0000 mL | Freq: Every day | ORAL | Status: DC | PRN

## 2021-03-07 NOTE — ED Notes (Signed)
Pt given Invega injection and AMA paperwork signed. Pt desired to leave. Pt given belongings from locker #7 and walked out. Pt stated he lives close and wanted to walk.

## 2021-03-07 NOTE — ED Provider Notes (Addendum)
Behavioral Health Admission H&P [facility] & OBS)  Date: 03/07/21 Patient Name: Garrett Bolton MRN: [id number] Chief Complaint:  Chief Complaint  Patient presents with   Altered Mental Status   Hallucinations      Diagnoses:  Final diagnoses:  Schizophrenia spectrum disorder with psychotic disorder type not yet determined [facility])    HPI: Garrett Bolton is a 20 year old male with psychiatric history of cannabis-induced psychosis, aggressive behavior, and paranoid delusions.  Patient presented voluntarily to [facility] via law enforcement with chief complaint of "I have been hearing a lot of voices for a few weeks and I think something is killing me in my sleep."  Patient was seen face-to-face and his chart was reviewed by this NP.  On evaluation, patient is alert and oriented to himself and situation, he is calm and cooperative.  Patient is speaking in a normal tone of voice at moderate rate and pace with good eye contact.  Patient's thought process is impaired.  He reports that he sometimes has occasional auditory hallucination but the voices has gotten worse over the past few weeks. Patient reported that he has been experiencing worsening auditory hallucination that are interfering with his life for the past 3 weeks.  He is unable to answer whether or not he is compliant with his medication at home. He denies visual hallucination.   He denies suicidal ideation, homicidal ideation, access to weapons, substance abuse, and past suicidal attempt/self harming.  Patient endorses paranoia ideation.  He reports "I feel like something is going to kill me in my sleep." He appears to be responding to internal stimuli during assessment.     PHQ 2-9:  Flowsheet Row ED from 02/21/2021 in [facility]  Thoughts that you would be better off dead, or of hurting yourself in some way Not at all  PHQ-9 Total Score 4       Flowsheet Row ED from 03/07/2021 in [facility] ED from 02/21/2021 in [facility] ED from 01/31/2021 in [facility]  C-SSRS RISK CATEGORY No Risk Error: Q3, 4, or 5 should not be populated when Q2 is No No Risk        Total Time spent with patient: 20 minutes  Musculoskeletal  Strength & Muscle Tone: within normal limits Gait & Station: normal Patient leans: Right  Psychiatric Specialty Exam  Presentation General Appearance: Appropriate for Environment  Eye Contact:Good  Speech:Clear and Coherent  Speech Volume:Normal  Handedness:Right   Mood and Affect  Mood:Anxious  Affect:Congruent   Thought Process  Thought Processes:Disorganized  Descriptions of Associations:Loose  Orientation:Full (Time, Place and Person)  Thought Content:WDL  Diagnosis of Schizophrenia or Schizoaffective disorder in past: Yes  Duration of Psychotic Symptoms: Less than six months  Hallucinations:Hallucinations: Auditory  Ideas of Reference:None  Suicidal Thoughts:Suicidal Thoughts: No  Homicidal Thoughts:Homicidal Thoughts: No   Sensorium  Memory:Immediate Good; Recent Fair; Remote Fair  Judgment:Poor  Insight:Lacking   Executive Functions  Concentration:Fair  Attention Span:Fair  Recall:Fair  Fund of Knowledge:Fair  Language:Fair   Psychomotor Activity  Psychomotor Activity:Psychomotor Activity: Normal   Assets  Assets:Desire for Improvement; Physical Health   Sleep  Sleep:Sleep: Fair   Nutritional Assessment (For OBS and FBC admissions only) Has the patient had a weight loss or gain of 10 pounds or more in the last 3 months?: No Has the patient had a decrease in food intake/or appetite?: No Does the patient have dental problems?: No Does  the patient have eating habits or behaviors that may be indicators of an eating disorder including binging or inducing vomiting?: No Has the patient recently lost weight without  trying?: 0 Has the patient been eating poorly because of a decreased appetite?: 0 Malnutrition Screening Tool Score: 0    Physical Exam Vitals and nursing note reviewed.  Constitutional:      General: He is not in acute distress.    Appearance: He is well-developed.  HENT:     Head: Normocephalic and atraumatic.  Eyes:     Conjunctiva/sclera: Conjunctivae normal.  Cardiovascular:     Rate and Rhythm: Normal rate.  Pulmonary:     Effort: Pulmonary effort is normal. No respiratory distress.     Breath sounds: Normal breath sounds.  Abdominal:     Palpations: Abdomen is soft.     Tenderness: There is no abdominal tenderness.  Musculoskeletal:        General: No swelling.     Cervical back: Normal range of motion.  Skin:    General: Skin is warm and dry.     Capillary Refill: Capillary refill takes less than 2 seconds.  Neurological:     Mental Status: He is alert. He is disoriented.  Psychiatric:        Attention and Perception: He is inattentive. He perceives auditory hallucinations.        Mood and Affect: Mood is anxious.        Speech: Speech is tangential.        Behavior: Behavior is cooperative.        Thought Content: Thought content is paranoid. Thought content is not delusional. Thought content does not include homicidal or suicidal ideation. Thought content does not include homicidal or suicidal plan.   Review of Systems  Constitutional: Negative.   HENT: Negative.    Eyes: Negative.   Respiratory: Negative.    Cardiovascular: Negative.   Gastrointestinal: Negative.   Genitourinary: Negative.   Musculoskeletal: Negative.   Skin: Negative.   Neurological: Negative.   Endo/Heme/Allergies: Negative.   Psychiatric/Behavioral:  Positive for hallucinations. Negative for substance abuse and suicidal ideas. The patient is nervous/anxious.    Blood pressure (!) 141/96, pulse 76, temperature (!) 97.4 F (36.3 C), temperature source Oral, resp. rate 16, SpO2 100 %.  There is no height or weight on file to calculate BMI.  Past Psychiatric History:    Is the patient at risk to self? No  Has the patient been a risk to self in the past 6 months? No .    Has the patient been a risk to self within the distant past? No   Is the patient a risk to others? No   Has the patient been a risk to others in the past 6 months? No   Has the patient been a risk to others within the distant past? No   Past Medical History:  Past Medical History:  Diagnosis Date   Anemia    Sickle cell trait ([place])     Past Surgical History:  Procedure Laterality Date   CIRCUMCISION  2001   LAPAROTOMY N/A 08/23/2019   Procedure: EXPLORATORY LAPAROTOMY;  Surgeon: Georganna Skeans, MD;  Location: [place];  Service: General;  Laterality: N/A;    Family History: No family history on file.  Social History:  Social History   Socioeconomic History   Marital status: Single    Spouse name: Not on file   Number of children: Not on file  Years of education: Not on file   Highest education level: Not on file  Occupational History   Not on file  Tobacco Use   Smoking status: Some Days    Packs/day: 0.25    Years: 4.00    Pack years: 1.00    Types: Cigars, Cigarettes   Smokeless tobacco: Never  Vaping Use   Vaping Use: Never used  Substance and Sexual Activity   Alcohol use: Yes   Drug use: Yes    Types: Methamphetamines, Marijuana    Comment: Daily   Sexual activity: Never  Other Topics Concern   Not on file  Social History Narrative   Not on file   Social Determinants of Health   Financial Resource Strain: Not on file  Food Insecurity: Not on file  Transportation Needs: Not on file  Physical Activity: Not on file  Stress: Not on file  Social Connections: Not on file  Intimate Partner Violence: Not on file    SDOH:  SDOH Screenings   Alcohol Screen: Low Risk    Last Alcohol Screening Score (AUDIT): 2  Depression (PHQ2-9): Low Risk    PHQ-2 Score: 4  Financial  Resource Strain: Not on file  Food Insecurity: Not on file  Housing: Not on file  Physical Activity: Not on file  Social Connections: Not on file  Stress: Not on file  Tobacco Use: High Risk   Smoking Tobacco Use: Some Days   Smokeless Tobacco Use: Never   Passive Exposure: Not on file  Transportation Needs: Not on file    Last Labs:  Admission on 03/07/2021  Component Date Value Ref Range Status   SARS Coronavirus 2 by RT PCR 03/07/2021 NEGATIVE  NEGATIVE Final   Comment: (NOTE) SARS-CoV-2 target nucleic acids are NOT DETECTED.  The SARS-CoV-2 RNA is generally detectable in upper respiratory specimens during the acute phase of infection. The lowest concentration of SARS-CoV-2 viral copies this assay can detect is 138 copies/mL. A negative result does not preclude SARS-Cov-2 infection and should not be used as the sole basis for treatment or other patient management decisions. A negative result may occur with  improper specimen collection/handling, submission of specimen other than nasopharyngeal swab, presence of viral mutation(s) within the areas targeted by this assay, and inadequate number of viral copies(<138 copies/mL). A negative result must be combined with clinical observations, patient history, and epidemiological information. The expected result is Negative.  Fact Sheet for Patients:  EntrepreneurPulse.com.au  Fact Sheet for Healthcare Providers:  IncredibleEmployment.be  This test is no                          t yet approved or cleared by the [place] FDA and  has been authorized for detection and/or diagnosis of SARS-CoV-2 by FDA under an Emergency Use Authorization (EUA). This EUA will remain  in effect (meaning this test can be used) for the duration of the COVID-19 declaration under Section 564(b)(1) of the Act, 21 U.S.C.section 360bbb-3(b)(1), unless the authorization is terminated  or revoked sooner.        Influenza A by PCR 03/07/2021 NEGATIVE  NEGATIVE Final   Influenza B by PCR 03/07/2021 NEGATIVE  NEGATIVE Final   Comment: (NOTE) The Xpert Xpress SARS-CoV-2/FLU/RSV plus assay is intended as an aid in the diagnosis of influenza from Nasopharyngeal swab specimens and should not be used as a sole basis for treatment. Nasal washings and aspirates are unacceptable for Xpert Xpress SARS-CoV-2/FLU/RSV testing.  Fact Sheet for Patients: EntrepreneurPulse.com.au  Fact Sheet for Healthcare Providers: IncredibleEmployment.be  This test is not yet approved or cleared by the [place] FDA and has been authorized for detection and/or diagnosis of SARS-CoV-2 by FDA under an Emergency Use Authorization (EUA). This EUA will remain in effect (meaning this test can be used) for the duration of the COVID-19 declaration under Section 564(b)(1) of the Act, 21 U.S.C. section 360bbb-3(b)(1), unless the authorization is terminated or revoked.  Performed at [facility], [place] [address], [place], [place] [address]    SARS Coronavirus 2 Ag 03/07/2021 Negative  Negative Preliminary   SARSCOV2ONAVIRUS 2 AG 03/07/2021 NEGATIVE  NEGATIVE Final   Comment: (NOTE) SARS-CoV-2 antigen NOT DETECTED.   Negative results are presumptive.  Negative results do not preclude SARS-CoV-2 infection and should not be used as the sole basis for treatment or other patient management decisions, including infection  control decisions, particularly in the presence of clinical signs and  symptoms consistent with COVID-19, or in those who have been in contact with the virus.  Negative results must be combined with clinical observations, patient history, and epidemiological information. The expected result is Negative.  Fact Sheet for Patients: HandmadeRecipes.com.cy  Fact Sheet for Healthcare Providers: FuneralLife.at  This test is not  yet approved or cleared by the [place] FDA and  has been authorized for detection and/or diagnosis of SARS-CoV-2 by FDA under an Emergency Use Authorization (EUA).  This EUA will remain in effect (meaning this test can be used) for the duration of  the COV                          ID-19 declaration under Section 564(b)(1) of the Act, 21 U.S.C. section 360bbb-3(b)(1), unless the authorization is terminated or revoked sooner.    Admission on 01/11/2021, Discharged on 01/11/2021  Component Date Value Ref Range Status   SARS Coronavirus 2 by RT PCR 01/11/2021 NEGATIVE  NEGATIVE Final   Comment: (NOTE) SARS-CoV-2 target nucleic acids are NOT DETECTED.  The SARS-CoV-2 RNA is generally detectable in upper respiratory specimens during the acute phase of infection. The lowest concentration of SARS-CoV-2 viral copies this assay can detect is 138 copies/mL. A negative result does not preclude SARS-Cov-2 infection and should not be used as the sole basis for treatment or other patient management decisions. A negative result may occur with  improper specimen collection/handling, submission of specimen other than nasopharyngeal swab, presence of viral mutation(s) within the areas targeted by this assay, and inadequate number of viral copies(<138 copies/mL). A negative result must be combined with clinical observations, patient history, and epidemiological information. The expected result is Negative.  Fact Sheet for Patients:  EntrepreneurPulse.com.au  Fact Sheet for Healthcare Providers:  IncredibleEmployment.be  This test is no                          t yet approved or cleared by the [place] FDA and  has been authorized for detection and/or diagnosis of SARS-CoV-2 by FDA under an Emergency Use Authorization (EUA). This EUA will remain  in effect (meaning this test can be used) for the duration of the COVID-19 declaration under Section  564(b)(1) of the Act, 21 U.S.C.section 360bbb-3(b)(1), unless the authorization is terminated  or revoked sooner.       Influenza A by PCR 01/11/2021 NEGATIVE  NEGATIVE Final   Influenza B by PCR 01/11/2021 NEGATIVE  NEGATIVE Final   Comment: (NOTE) The Xpert Xpress SARS-CoV-2/FLU/RSV plus assay is intended as an aid in the diagnosis of influenza from Nasopharyngeal swab specimens and should not be used as a sole basis for treatment. Nasal washings and aspirates are unacceptable for Xpert Xpress SARS-CoV-2/FLU/RSV testing.  Fact Sheet for Patients: EntrepreneurPulse.com.au  Fact Sheet for Healthcare Providers: IncredibleEmployment.be  This test is not yet approved or cleared by the [place] FDA and has been authorized for detection and/or diagnosis of SARS-CoV-2 by FDA under an Emergency Use Authorization (EUA). This EUA will remain in effect (meaning this test can be used) for the duration of the COVID-19 declaration under Section 564(b)(1) of the Act, 21 U.S.C. section 360bbb-3(b)(1), unless the authorization is terminated or revoked.  Performed at [facility], [place] [address] [place], [place] [address]    SARS Coronavirus 2 Ag 01/11/2021 Negative  Negative Preliminary  Admission on 01/01/2021, Discharged on 01/02/2021  Component Date Value Ref Range Status   SARS Coronavirus 2 by RT PCR 01/01/2021 NEGATIVE  NEGATIVE Final   Comment: (NOTE) SARS-CoV-2 target nucleic acids are NOT DETECTED.  The SARS-CoV-2 RNA is generally detectable in upper respiratory specimens during the acute phase of infection. The lowest concentration of SARS-CoV-2 viral copies this assay can detect is 138 copies/mL. A negative result does not preclude SARS-Cov-2 infection and should not be used as the sole basis for treatment or other patient management decisions. A negative result may occur with  improper specimen collection/handling, submission of  specimen other than nasopharyngeal swab, presence of viral mutation(s) within the areas targeted by this assay, and inadequate number of viral copies(<138 copies/mL). A negative result must be combined with clinical observations, patient history, and epidemiological information. The expected result is Negative.  Fact Sheet for Patients:  EntrepreneurPulse.com.au  Fact Sheet for Healthcare Providers:  IncredibleEmployment.be  This test is no                          t yet approved or cleared by the [place] FDA and  has been authorized for detection and/or diagnosis of SARS-CoV-2 by FDA under an Emergency Use Authorization (EUA). This EUA will remain  in effect (meaning this test can be used) for the duration of the COVID-19 declaration under Section 564(b)(1) of the Act, 21 U.S.C.section 360bbb-3(b)(1), unless the authorization is terminated  or revoked sooner.       Influenza A by PCR 01/01/2021 NEGATIVE  NEGATIVE Final   Influenza B by PCR 01/01/2021 NEGATIVE  NEGATIVE Final   Comment: (NOTE) The Xpert Xpress SARS-CoV-2/FLU/RSV plus assay is intended as an aid in the diagnosis of influenza from Nasopharyngeal swab specimens and should not be used as a sole basis for treatment. Nasal washings and aspirates are unacceptable for Xpert Xpress SARS-CoV-2/FLU/RSV testing.  Fact Sheet for Patients: EntrepreneurPulse.com.au  Fact Sheet for Healthcare Providers: IncredibleEmployment.be  This test is not yet approved or cleared by the [place] FDA and has been authorized for detection and/or diagnosis of SARS-CoV-2 by FDA under an Emergency Use Authorization (EUA). This EUA will remain in effect (meaning this test can be used) for the duration of the COVID-19 declaration under Section 564(b)(1) of the Act, 21 U.S.C. section 360bbb-3(b)(1), unless the authorization is terminated  or revoked.  Performed at [facility], [place] [address] [place], [place] [address]    WBC 01/01/2021 10.9 (H)  4.0 - 10.5 K/uL Final   RBC 01/01/2021  6.11 (H)  4.22 - 5.81 MIL/uL Final   Hemoglobin 01/01/2021 14.4  13.0 - 17.0 g/dL Final   HCT 01/01/2021 45.8  39.0 - 52.0 % Final   MCV 01/01/2021 75.0 (L)  80.0 - 100.0 fL Final   MCH 01/01/2021 23.6 (L)  26.0 - 34.0 pg Final   MCHC 01/01/2021 31.4  30.0 - 36.0 g/dL Final   RDW 01/01/2021 13.2  11.5 - 15.5 % Final   Platelets 01/01/2021 330  150 - 400 K/uL Final   nRBC 01/01/2021 0.0  0.0 - 0.2 % Final   Neutrophils Relative % 01/01/2021 67  % Final   Neutro Abs 01/01/2021 7.4  1.7 - 7.7 K/uL Final   Lymphocytes Relative 01/01/2021 24  % Final   Lymphs Abs 01/01/2021 2.6  0.7 - 4.0 K/uL Final   Monocytes Relative 01/01/2021 6  % Final   Monocytes Absolute 01/01/2021 0.7  0.1 - 1.0 K/uL Final   Eosinophils Relative 01/01/2021 1  % Final   Eosinophils Absolute 01/01/2021 0.1  0.0 - 0.5 K/uL Final   Basophils Relative 01/01/2021 1  % Final   Basophils Absolute 01/01/2021 0.1  0.0 - 0.1 K/uL Final   Immature Granulocytes 01/01/2021 1  % Final   Abs Immature Granulocytes 01/01/2021 0.06  0.00 - 0.07 K/uL Final   Performed at [facility], [place] [address] [place], [place] [address]   Sodium 01/01/2021 140  135 - 145 mmol/L Final   Potassium 01/01/2021 4.4  3.5 - 5.1 mmol/L Final   Chloride 01/01/2021 102  98 - 111 mmol/L Final   CO2 01/01/2021 27  22 - 32 mmol/L Final   Glucose, Bld 01/01/2021 75  70 - 99 mg/dL Final   Glucose reference range applies only to samples taken after fasting for at least 8 hours.   BUN 01/01/2021 9  6 - 20 mg/dL Final   Creatinine, Ser 01/01/2021 1.01  0.61 - 1.24 mg/dL Final   Calcium 01/01/2021 10.1  8.9 - 10.3 mg/dL Final   Total Protein 01/01/2021 6.7  6.5 - 8.1 g/dL Final   Albumin 01/01/2021 4.5  3.5 - 5.0 g/dL Final   AST 01/01/2021 27  15 - 41 U/L Final   ALT 01/01/2021 19  0 - 44 U/L  Final   Alkaline Phosphatase 01/01/2021 63  38 - 126 U/L Final   Total Bilirubin 01/01/2021 0.6  0.3 - 1.2 mg/dL Final   GFR, Estimated 01/01/2021 >60  >60 mL/min Final   Comment: (NOTE) Calculated using the CKD-EPI Creatinine Equation (2021)    Anion gap 01/01/2021 11  5 - 15 Final   Performed at [place] [address], [place], [place] [address]   Alcohol, Ethyl (B) 01/01/2021 20 (H)  <10 mg/dL Final   Comment: (NOTE) Lowest detectable limit for serum alcohol is 10 mg/dL.  For medical purposes only. Performed at [facility], [place] [address] [place], [place] [address]    Hgb A1c MFr Bld 01/01/2021 4.4 (L)  4.8 - 5.6 % Final   Comment: (NOTE) Pre diabetes:          5.7%-6.4%  Diabetes:              >6.4%  Glycemic control for   <7.0% adults with diabetes    Mean Plasma Glucose 01/01/2021 79.58  mg/dL Final   Performed at [facility], [place] [address] [place], [place] [address]   Cholesterol 01/01/2021 161  0 -  200 mg/dL Final   Triglycerides 01/01/2021 60  <150 mg/dL Final   HDL 01/01/2021 59  >40 mg/dL Final   Total CHOL/HDL Ratio 01/01/2021 2.7  RATIO Final   VLDL 01/01/2021 12  0 - 40 mg/dL Final   LDL Cholesterol 01/01/2021 90  0 - 99 mg/dL Final   Comment:        Total Cholesterol/HDL:CHD Risk Coronary Heart Disease Risk Table                     Men   Women  1/2 Average Risk   3.4   3.3  Average Risk       5.0   4.4  2 X Average Risk   9.6   7.1  3 X Average Risk  23.4   11.0        Use the calculated Patient Ratio above and the CHD Risk Table to determine the patient's CHD Risk.        ATP III CLASSIFICATION (LDL):  <100     mg/dL   Optimal  100-129  mg/dL   Near or Above                    Optimal  130-159  mg/dL   Borderline  160-189  mg/dL   High  >190     mg/dL   Very High Performed at [place] [address] [place], [place] [address]    TSH 01/01/2021 0.604  0.350 - 4.500 uIU/mL Final   Comment: Performed by a  3rd Generation assay with a functional sensitivity of <=0.01 uIU/mL. Performed at [facility], [place] [address], [place], [place] [address]    POC Amphetamine UR 01/01/2021 None Detected  NONE DETECTED (Cut Off Level 1000 ng/mL) Final   POC Secobarbital (BAR) 01/01/2021 None Detected  NONE DETECTED (Cut Off Level 300 ng/mL) Final   POC Buprenorphine (BUP) 01/01/2021 None Detected  NONE DETECTED (Cut Off Level 10 ng/mL) Final   POC Oxazepam (BZO) 01/01/2021 None Detected  NONE DETECTED (Cut Off Level 300 ng/mL) Final   POC Cocaine UR 01/01/2021 None Detected  NONE DETECTED (Cut Off Level 300 ng/mL) Final   POC Methamphetamine UR 01/01/2021 None Detected  NONE DETECTED (Cut Off Level 1000 ng/mL) Final   POC Morphine 01/01/2021 None Detected  NONE DETECTED (Cut Off Level 300 ng/mL) Final   POC Oxycodone UR 01/01/2021 None Detected  NONE DETECTED (Cut Off Level 100 ng/mL) Final   POC Methadone UR 01/01/2021 None Detected  NONE DETECTED (Cut Off Level 300 ng/mL) Final   POC Marijuana UR 01/01/2021 Positive (A)  NONE DETECTED (Cut Off Level 50 ng/mL) Final   SARS Coronavirus 2 Ag 01/01/2021 Negative  Negative Final    Allergies: Patient has no known allergies.  PTA Medications: (Not in a hospital admission)   Medical Decision Making  Patient recommended for inpatient psychiatric admission for stabilization. Patient will be admitted to [facility] for continuous assessment while waiting for inpatient bed to become available.      Recommendations  Based on my evaluation the patient does not appear to have an emergency medical condition.  Ophelia Shoulder, NP 03/07/21  5:13 AM

## 2021-03-07 NOTE — ED Notes (Signed)
PT refuses to allow this nurse to draw his blood, pt also refuses to provide urine for UDS. This nurse advised the provider of the following

## 2021-03-07 NOTE — ED Notes (Signed)
Pt pacing the halls.  States that he would like to leave.   NP notified.

## 2021-03-07 NOTE — ED Notes (Signed)
Pt currently resting quietly.  No distress noted. Breathing even and unlabored.  Will continue to monitor and await dispo orders.

## 2021-03-07 NOTE — ED Notes (Signed)
Pt refused to allow staff to collect blood specimens for ordered labs.

## 2021-03-07 NOTE — ED Provider Notes (Addendum)
Behavioral Health Progress Note  Date and Time: 03/07/2021 2:39 PM Name: Garrett Bolton MRN:  [id number]  Subjective:   Garrett Bolton was seen and evaluated face-to-face.  He presents with a polite and pleasant affect.  Continues to deny suicidal or homicidal ideations.  Denies auditory or visual hallucinations.  Patient presents under involuntary commitment due to hallucinations, paranoia and nonmedication compliant.  Discussed initiating long-acting for Resporal constant.  Chart reviewed: Per last discharge summary patient has received Invega sustain a injection. NP will follow-up with the pharmacist.-Patient continues to meet inpatient criteria.  Attempted to call patient's sister for additional collateral- no answer [phone].  CSW to continue seeking inpatient admission.  Support, encouragement and reassurance was provided.  Diagnosis:  Final diagnoses:  Schizophrenia spectrum disorder with psychotic disorder type not yet determined (HCC)   15 minutes Total Time spent with patient:   Past Psychiatric History:  Past Medical History:  Past Medical History:  Diagnosis Date   Anemia    Sickle cell trait (HCC)     Past Surgical History:  Procedure Laterality Date   CIRCUMCISION  2001   LAPAROTOMY N/A 08/23/2019   Procedure: EXPLORATORY LAPAROTOMY;  Surgeon: Violeta Gelinas, MD;  Location: [facility] OR;  Service: General;  Laterality: N/A;   Family History: No family history on file. Family Psychiatric  History:  Social History:  Social History   Substance and Sexual Activity  Alcohol Use Yes     Social History   Substance and Sexual Activity  Drug Use Yes   Types: Methamphetamines, Marijuana   Comment: Daily    Social History   Socioeconomic History   Marital status: Single    Spouse name: Not on file   Number of children: Not on file   Years of education: Not on file   Highest education level: Not on file  Occupational History   Not on file  Tobacco Use   Smoking  status: Some Days    Packs/day: 0.25    Years: 4.00    Pack years: 1.00    Types: Cigars, Cigarettes   Smokeless tobacco: Never  Vaping Use   Vaping Use: Never used  Substance and Sexual Activity   Alcohol use: Yes   Drug use: Yes    Types: Methamphetamines, Marijuana    Comment: Daily   Sexual activity: Never  Other Topics Concern   Not on file  Social History Narrative   Not on file   Social Determinants of Health   Financial Resource Strain: Not on file  Food Insecurity: Not on file  Transportation Needs: Not on file  Physical Activity: Not on file  Stress: Not on file  Social Connections: Not on file   SDOH:  SDOH Screenings   Alcohol Screen: Low Risk    Last Alcohol Screening Score (AUDIT): 2  Depression (PHQ2-9): Low Risk    PHQ-2 Score: 4  Financial Resource Strain: Not on file  Food Insecurity: Not on file  Housing: Not on file  Physical Activity: Not on file  Social Connections: Not on file  Stress: Not on file  Tobacco Use: High Risk   Smoking Tobacco Use: Some Days   Smokeless Tobacco Use: Never   Passive Exposure: Not on file  Transportation Needs: Not on file   Additional Social History:    Pain Medications: See MAR Prescriptions: See MAR Over the Counter: See MAR History of alcohol / drug use?: No history of alcohol / drug abuse (Pt denies) Longest period of sobriety (when/how long):  Unknown Negative Consequences of Use:  (Unable to assess due to AMS) Withdrawal Symptoms:  (Unable to assess due to AMS) Name of Substance 1: Marijuana 1 - Age of First Use: UTA 1 - Amount (size/oz): Blunt 1 - Frequency: Every other day 1 - Duration: Ongoing 1 - Last Use / Amount: Per pt report, 6 mos ago 1 - Method of Aquiring: NA 1- Route of Use: Smoking                  Sleep: Fair  Appetite:  Fair  Current Medications:  Current Facility-Administered Medications  Medication Dose Route Frequency Provider Last Rate Last Admin   acetaminophen  (TYLENOL) tablet 650 mg  650 mg Oral Q6H PRN Ajibola, Ene A, NP       alum & mag hydroxide-simeth (MAALOX/MYLANTA) 200-200-20 MG/5ML suspension 30 mL  30 mL Oral Q4H PRN Ajibola, Ene A, NP       hydrOXYzine (ATARAX) tablet 25 mg  25 mg Oral TID PRN Ajibola, Ene A, NP       magnesium hydroxide (MILK OF MAGNESIA) suspension 30 mL  30 mL Oral Daily PRN Ajibola, Ene A, NP       risperiDONE (RISPERDAL M-TABS) disintegrating tablet 2 mg  2 mg Oral BID Ajibola, Ene A, NP   2 mg at 03/07/21 0946   traZODone (DESYREL) tablet 50 mg  50 mg Oral QHS PRN Ajibola, Ene A, NP       Current Outpatient Medications  Medication Sig Dispense Refill   risperiDONE (RISPERDAL M-TABS) 2 MG disintegrating tablet Dissolve 1 tablet (2 mg total) by mouth 2 (two) times daily. 60 tablet 0    Labs  Lab Results:  Admission on 03/07/2021  Component Date Value Ref Range Status   SARS Coronavirus 2 by RT PCR 03/07/2021 NEGATIVE  NEGATIVE Final   Comment: (NOTE) SARS-CoV-2 target nucleic acids are NOT DETECTED.  The SARS-CoV-2 RNA is generally detectable in upper respiratory specimens during the acute phase of infection. The lowest concentration of SARS-CoV-2 viral copies this assay can detect is 138 copies/mL. A negative result does not preclude SARS-Cov-2 infection and should not be used as the sole basis for treatment or other patient management decisions. A negative result may occur with  improper specimen collection/handling, submission of specimen other than nasopharyngeal swab, presence of viral mutation(s) within the areas targeted by this assay, and inadequate number of viral copies(<138 copies/mL). A negative result must be combined with clinical observations, patient history, and epidemiological information. The expected result is Negative.  Fact Sheet for Patients:  BloggerCourse.com  Fact Sheet for Healthcare Providers:  SeriousBroker.it  This test is  no                          t yet approved or cleared by the [place] FDA and  has been authorized for detection and/or diagnosis of SARS-CoV-2 by FDA under an Emergency Use Authorization (EUA). This EUA will remain  in effect (meaning this test can be used) for the duration of the COVID-19 declaration under Section 564(b)(1) of the Act, 21 U.S.C.section 360bbb-3(b)(1), unless the authorization is terminated  or revoked sooner.       Influenza A by PCR 03/07/2021 NEGATIVE  NEGATIVE Final   Influenza B by PCR 03/07/2021 NEGATIVE  NEGATIVE Final   Comment: (NOTE) The Xpert Xpress SARS-CoV-2/FLU/RSV plus assay is intended as an aid in the diagnosis of influenza from Nasopharyngeal swab specimens and should  not be used as a sole basis for treatment. Nasal washings and aspirates are unacceptable for Xpert Xpress SARS-CoV-2/FLU/RSV testing.  Fact Sheet for Patients: BloggerCourse.com  Fact Sheet for Healthcare Providers: SeriousBroker.it  This test is not yet approved or cleared by the [place] FDA and has been authorized for detection and/or diagnosis of SARS-CoV-2 by FDA under an Emergency Use Authorization (EUA). This EUA will remain in effect (meaning this test can be used) for the duration of the COVID-19 declaration under Section 564(b)(1) of the Act, 21 U.S.C. section 360bbb-3(b)(1), unless the authorization is terminated or revoked.  Performed at [facility], [address], [place], [place] [address]    POC Amphetamine UR 03/07/2021 None Detected  NONE DETECTED (Cut Off Level 1000 ng/mL) Final   POC Secobarbital (BAR) 03/07/2021 None Detected  NONE DETECTED (Cut Off Level 300 ng/mL) Final   POC Buprenorphine (BUP) 03/07/2021 None Detected  NONE DETECTED (Cut Off Level 10 ng/mL) Final   POC Oxazepam (BZO) 03/07/2021 None Detected  NONE DETECTED (Cut Off Level 300 ng/mL) Final   POC Cocaine UR 03/07/2021 None  Detected  NONE DETECTED (Cut Off Level 300 ng/mL) Final   POC Methamphetamine UR 03/07/2021 None Detected  NONE DETECTED (Cut Off Level 1000 ng/mL) Final   POC Morphine 03/07/2021 None Detected  NONE DETECTED (Cut Off Level 300 ng/mL) Final   POC Oxycodone UR 03/07/2021 None Detected  NONE DETECTED (Cut Off Level 100 ng/mL) Final   POC Methadone UR 03/07/2021 None Detected  NONE DETECTED (Cut Off Level 300 ng/mL) Final   POC Marijuana UR 03/07/2021 None Detected  NONE DETECTED (Cut Off Level 50 ng/mL) Final   SARS Coronavirus 2 Ag 03/07/2021 Negative  Negative Preliminary   SARSCOV2ONAVIRUS 2 AG 03/07/2021 NEGATIVE  NEGATIVE Final   Comment: (NOTE) SARS-CoV-2 antigen NOT DETECTED.   Negative results are presumptive.  Negative results do not preclude SARS-CoV-2 infection and should not be used as the sole basis for treatment or other patient management decisions, including infection  control decisions, particularly in the presence of clinical signs and  symptoms consistent with COVID-19, or in those who have been in contact with the virus.  Negative results must be combined with clinical observations, patient history, and epidemiological information. The expected result is Negative.  Fact Sheet for Patients: https://www.jennings-kim.com/  Fact Sheet for Healthcare Providers: https://alexander-rogers.biz/  This test is not yet approved or cleared by the [place] FDA and  has been authorized for detection and/or diagnosis of SARS-CoV-2 by FDA under an Emergency Use Authorization (EUA).  This EUA will remain in effect (meaning this test can be used) for the duration of  the COV                          ID-19 declaration under Section 564(b)(1) of the Act, 21 U.S.C. section 360bbb-3(b)(1), unless the authorization is terminated or revoked sooner.    Admission on 01/11/2021, Discharged on 01/11/2021  Component Date Value Ref Range Status   SARS Coronavirus  2 by RT PCR 01/11/2021 NEGATIVE  NEGATIVE Final   Comment: (NOTE) SARS-CoV-2 target nucleic acids are NOT DETECTED.  The SARS-CoV-2 RNA is generally detectable in upper respiratory specimens during the acute phase of infection. The lowest concentration of SARS-CoV-2 viral copies this assay can detect is 138 copies/mL. A negative result does not preclude SARS-Cov-2 infection and should not be used as the sole basis for treatment or other patient management decisions.  A negative result may occur with  improper specimen collection/handling, submission of specimen other than nasopharyngeal swab, presence of viral mutation(s) within the areas targeted by this assay, and inadequate number of viral copies(<138 copies/mL). A negative result must be combined with clinical observations, patient history, and epidemiological information. The expected result is Negative.  Fact Sheet for Patients:  BloggerCourse.com  Fact Sheet for Healthcare Providers:  SeriousBroker.it  This test is no                          t yet approved or cleared by the [place] FDA and  has been authorized for detection and/or diagnosis of SARS-CoV-2 by FDA under an Emergency Use Authorization (EUA). This EUA will remain  in effect (meaning this test can be used) for the duration of the COVID-19 declaration under Section 564(b)(1) of the Act, 21 U.S.C.section 360bbb-3(b)(1), unless the authorization is terminated  or revoked sooner.       Influenza A by PCR 01/11/2021 NEGATIVE  NEGATIVE Final   Influenza B by PCR 01/11/2021 NEGATIVE  NEGATIVE Final   Comment: (NOTE) The Xpert Xpress SARS-CoV-2/FLU/RSV plus assay is intended as an aid in the diagnosis of influenza from Nasopharyngeal swab specimens and should not be used as a sole basis for treatment. Nasal washings and aspirates are unacceptable for Xpert Xpress SARS-CoV-2/FLU/RSV testing.  Fact Sheet for  Patients: BloggerCourse.com  Fact Sheet for Healthcare Providers: SeriousBroker.it  This test is not yet approved or cleared by the [place] FDA and has been authorized for detection and/or diagnosis of SARS-CoV-2 by FDA under an Emergency Use Authorization (EUA). This EUA will remain in effect (meaning this test can be used) for the duration of the COVID-19 declaration under Section 564(b)(1) of the Act, 21 U.S.C. section 360bbb-3(b)(1), unless the authorization is terminated or revoked.  Performed at [facility], [address], [place], [place] [address]    SARS Coronavirus 2 Ag 01/11/2021 Negative  Negative Preliminary  Admission on 01/01/2021, Discharged on 01/02/2021  Component Date Value Ref Range Status   SARS Coronavirus 2 by RT PCR 01/01/2021 NEGATIVE  NEGATIVE Final   Comment: (NOTE) SARS-CoV-2 target nucleic acids are NOT DETECTED.  The SARS-CoV-2 RNA is generally detectable in upper respiratory specimens during the acute phase of infection. The lowest concentration of SARS-CoV-2 viral copies this assay can detect is 138 copies/mL. A negative result does not preclude SARS-Cov-2 infection and should not be used as the sole basis for treatment or other patient management decisions. A negative result may occur with  improper specimen collection/handling, submission of specimen other than nasopharyngeal swab, presence of viral mutation(s) within the areas targeted by this assay, and inadequate number of viral copies(<138 copies/mL). A negative result must be combined with clinical observations, patient history, and epidemiological information. The expected result is Negative.  Fact Sheet for Patients:  BloggerCourse.com  Fact Sheet for Healthcare Providers:  SeriousBroker.it  This test is no                          t yet approved or cleared by the [place] FDA and  has been authorized for detection and/or diagnosis of SARS-CoV-2 by FDA under an Emergency Use Authorization (EUA). This EUA will remain  in effect (meaning this test can be used) for the duration of the COVID-19 declaration under Section 564(b)(1) of the Act, 21 U.S.C.section 360bbb-3(b)(1), unless the authorization  is terminated  or revoked sooner.       Influenza A by PCR 01/01/2021 NEGATIVE  NEGATIVE Final   Influenza B by PCR 01/01/2021 NEGATIVE  NEGATIVE Final   Comment: (NOTE) The Xpert Xpress SARS-CoV-2/FLU/RSV plus assay is intended as an aid in the diagnosis of influenza from Nasopharyngeal swab specimens and should not be used as a sole basis for treatment. Nasal washings and aspirates are unacceptable for Xpert Xpress SARS-CoV-2/FLU/RSV testing.  Fact Sheet for Patients: BloggerCourse.com  Fact Sheet for Healthcare Providers: SeriousBroker.it  This test is not yet approved or cleared by the [place] FDA and has been authorized for detection and/or diagnosis of SARS-CoV-2 by FDA under an Emergency Use Authorization (EUA). This EUA will remain in effect (meaning this test can be used) for the duration of the COVID-19 declaration under Section 564(b)(1) of the Act, 21 U.S.C. section 360bbb-3(b)(1), unless the authorization is terminated or revoked.  Performed at [facility], [address] [place], [place] [address]    WBC 01/01/2021 10.9 (H)  4.0 - 10.5 K/uL Final   RBC 01/01/2021 6.11 (H)  4.22 - 5.81 MIL/uL Final   Hemoglobin 01/01/2021 14.4  13.0 - 17.0 g/dL Final   HCT 29/56/2130 45.8  39.0 - 52.0 % Final   MCV 01/01/2021 75.0 (L)  80.0 - 100.0 fL Final   MCH 01/01/2021 23.6 (L)  26.0 - 34.0 pg Final   MCHC 01/01/2021 31.4  30.0 - 36.0 g/dL Final   RDW 86/57/8469 13.2  11.5 - 15.5 % Final   Platelets 01/01/2021 330  150 - 400 K/uL Final   nRBC 01/01/2021 0.0  0.0 - 0.2 % Final    Neutrophils Relative % 01/01/2021 67  % Final   Neutro Abs 01/01/2021 7.4  1.7 - 7.7 K/uL Final   Lymphocytes Relative 01/01/2021 24  % Final   Lymphs Abs 01/01/2021 2.6  0.7 - 4.0 K/uL Final   Monocytes Relative 01/01/2021 6  % Final   Monocytes Absolute 01/01/2021 0.7  0.1 - 1.0 K/uL Final   Eosinophils Relative 01/01/2021 1  % Final   Eosinophils Absolute 01/01/2021 0.1  0.0 - 0.5 K/uL Final   Basophils Relative 01/01/2021 1  % Final   Basophils Absolute 01/01/2021 0.1  0.0 - 0.1 K/uL Final   Immature Granulocytes 01/01/2021 1  % Final   Abs Immature Granulocytes 01/01/2021 0.06  0.00 - 0.07 K/uL Final   Performed at [facility], [address] [place], [place] [address]   Sodium 01/01/2021 140  135 - 145 mmol/L Final   Potassium 01/01/2021 4.4  3.5 - 5.1 mmol/L Final   Chloride 01/01/2021 102  98 - 111 mmol/L Final   CO2 01/01/2021 27  22 - 32 mmol/L Final   Glucose, Bld 01/01/2021 75  70 - 99 mg/dL Final   Glucose reference range applies only to samples taken after fasting for at least 8 hours.   BUN 01/01/2021 9  6 - 20 mg/dL Final   Creatinine, Ser 01/01/2021 1.01  0.61 - 1.24 mg/dL Final   Calcium 84/13/2440 10.1  8.9 - 10.3 mg/dL Final   Total Protein 01/14/2535 6.7  6.5 - 8.1 g/dL Final   Albumin 64/40/3474 4.5  3.5 - 5.0 g/dL Final   AST 25/95/6387 27  15 - 41 U/L Final   ALT 01/01/2021 19  0 - 44 U/L Final   Alkaline Phosphatase 01/01/2021 63  38 - 126 U/L Final   Total Bilirubin  01/01/2021 0.6  0.3 - 1.2 mg/dL Final   GFR, Estimated 01/01/2021 >60  >60 mL/min Final   Comment: (NOTE) Calculated using the CKD-EPI Creatinine Equation (2021)    Anion gap 01/01/2021 11  5 - 15 Final   Performed at [facility], [address], [place], [place] [address]   Alcohol, Ethyl (B) 01/01/2021 20 (H)  <10 mg/dL Final   Comment: (NOTE) Lowest detectable limit for serum alcohol is 10 mg/dL.  For medical purposes only. Performed at [facility], [address] [place], [place] [address]    Hgb A1c MFr Bld 01/01/2021 4.4 (L)  4.8 - 5.6 % Final   Comment: (NOTE) Pre diabetes:          5.7%-6.4%  Diabetes:              >6.4%  Glycemic control for   <7.0% adults with diabetes    Mean Plasma Glucose 01/01/2021 79.58  mg/dL Final   Performed at [facility], [address], [place], [place] [address]   Cholesterol 01/01/2021 161  0 - 200 mg/dL Final   Triglycerides 91/47/8295 60  <150 mg/dL Final   HDL 62/13/0865 59  >40 mg/dL Final   Total CHOL/HDL Ratio 01/01/2021 2.7  RATIO Final   VLDL 01/01/2021 12  0 - 40 mg/dL Final   LDL Cholesterol 01/01/2021 90  0 - 99 mg/dL Final   Comment:        Total Cholesterol/HDL:CHD Risk Coronary Heart Disease Risk Table                     Men   Women  1/2 Average Risk   3.4   3.3  Average Risk       5.0   4.4  2 X Average Risk   9.6   7.1  3 X Average Risk  23.4   11.0        Use the calculated Patient Ratio above and the CHD Risk Table to determine the patient's CHD Risk.        ATP III CLASSIFICATION (LDL):  <100     mg/dL   Optimal  784-696  mg/dL   Near or Above                    Optimal  130-159  mg/dL   Borderline  295-284  mg/dL   High  >132     mg/dL   Very High Performed at [facility], [address], [place], [place] [address]    TSH 01/01/2021 0.604  0.350 - 4.500 uIU/mL Final   Comment: Performed by a 3rd Generation assay with a functional sensitivity of <=0.01 uIU/mL. Performed at [facility], [address] [place], [place] [address]    POC Amphetamine UR 01/01/2021 None Detected  NONE DETECTED (Cut Off Level 1000 ng/mL) Final   POC Secobarbital (BAR) 01/01/2021 None Detected  NONE DETECTED (Cut Off Level 300 ng/mL) Final   POC Buprenorphine (BUP) 01/01/2021 None Detected  NONE DETECTED (Cut Off Level 10 ng/mL) Final   POC Oxazepam (BZO) 01/01/2021 None Detected  NONE DETECTED (Cut Off Level 300 ng/mL) Final   POC Cocaine UR 01/01/2021 None Detected  NONE  DETECTED (Cut Off Level 300 ng/mL) Final   POC Methamphetamine UR 01/01/2021 None Detected  NONE DETECTED (Cut Off Level 1000 ng/mL) Final   POC Morphine 01/01/2021 None Detected  NONE DETECTED (Cut  Off Level 300 ng/mL) Final   POC Oxycodone UR 01/01/2021 None Detected  NONE DETECTED (Cut Off Level 100 ng/mL) Final   POC Methadone UR 01/01/2021 None Detected  NONE DETECTED (Cut Off Level 300 ng/mL) Final   POC Marijuana UR 01/01/2021 Positive (A)  NONE DETECTED (Cut Off Level 50 ng/mL) Final   SARS Coronavirus 2 Ag 01/01/2021 Negative  Negative Final    Blood Alcohol level:  Lab Results  Component Value Date   ETH 20 (H) 01/01/2021   ETH <10 02/25/2020    Metabolic Disorder Labs: Lab Results  Component Value Date   HGBA1C 4.4 (L) 01/01/2021   MPG 79.58 01/01/2021   MPG 76.71 04/21/2019   Lab Results  Component Value Date   PROLACTIN 15.2 04/21/2019   Lab Results  Component Value Date   CHOL 161 01/01/2021   TRIG 60 01/01/2021   HDL 59 01/01/2021   CHOLHDL 2.7 01/01/2021   VLDL 12 01/01/2021   LDLCALC 90 01/01/2021   LDLCALC 90 04/21/2019    Therapeutic Lab Levels: No results found for: LITHIUM No results found for: VALPROATE No components found for:  CBMZ  Physical Findings   AIMS    Flowsheet Row [facility]) [facility] Ad[facility] (Discharged) [facility] Admission (Discharged) from OP Visit from [facility] Admission (Discharged) [facility]  AIMS Total Score 0 0 0 0      AUDIT    Flowsheet Row Ad[facility] (Discharged) [facility] Admission (Discharged) from [facility] Admission (Discharged) from OP Visit from 05/04/2019 [facility]  Admission (Discharged) [facility] Admission (Discharged) from OP Visit from 04/20/2019 [facility]  Alcohol Use Disorder Identification Test Final Score (AUDIT) 2 1 5  0 0      CAGE-AID    Flowsheet Row ED to Hosp-Admission (Discharged) from 08/22/2019 in [facility]  SURGICAL  CAGE-AID Score 0      PHQ2-9    Flowsheet Row ED from 02/21/2021 in [facility]  PHQ-2 Total Score 2  PHQ-9 Total Score 4      Flowsheet Row ED [facility] 03/07/2021 in [facility] ED from 02/21/2021 in [facility] ED from 01/31/2021 in [facility]  C-SSRS RISK CATEGORY No Risk Error: Q3, 4, or 5 should not be populated when Q2 is No No Risk        Musculoskeletal  Strength & Muscle Tone: within normal limits Gait & Station: normal Patient leans: N/A  Psychiatric Specialty Exam  Presentation  General Appearance: Appropriate for Environment  Eye Contact:Good  Speech:Clear and Coherent  Speech Volume:Normal  Handedness:Right   Mood and Affect  Mood:Anxious  Affect:Congruent   Thought Process  Thought Processes:Disorganized  Descriptions of Associations:Loose  Orientation:Full (Time, Place and Person)  Thought Content:WDL  Diagnosis of Schizophrenia or Schizoaffective disorder in past: Yes  Duration of Psychotic Symptoms: Less than six months   Hallucinations:Hallucinations: Auditory  Ideas of Reference:None  Suicidal Thoughts:Suicidal Thoughts: No  Homicidal Thoughts:Homicidal Thoughts: No   Sensorium  Memory:Immediate Good; Recent Fair; Remote Fair  Judgment:Poor  Insight:Lacking   Executive Functions  Concentration:Fair  Attention Span:Fair  Recall:Fair  Fund of  Knowledge:Fair  Language:Fair   Psychomotor Activity  Psychomotor  Activity:Psychomotor Activity: Normal   Assets  Assets:Desire for Improvement; Physical Health   Sleep  Sleep:Sleep: Fair   Nutritional Assessment (For OBS and [facility] admissions only) Has the patient had a weight loss or gain of 10 pounds or more in the last 3 months?: No Has the patient had a decrease in food intake/or appetite?: No Does the patient have dental problems?: No Does the patient have eating habits or behaviors that may be indicators of an eating disorder including binging or inducing vomiting?: No Has the patient recently lost weight without trying?: 0 Has the patient been eating poorly because of a decreased appetite?: 0 Malnutrition Screening Tool Score: 0    Physical Exam  Physical Exam Vitals and nursing note reviewed.  Cardiovascular:     Rate and Rhythm: Normal rate.  Pulmonary:     Effort: Pulmonary effort is normal.  Psychiatric:        Attention and Perception: Attention normal.        Mood and Affect: Mood normal.        Speech: Speech normal.        Behavior: Behavior is cooperative.        Thought Content: Thought content normal.        Cognition and Memory: Cognition normal.        Judgment: Judgment normal.   Review of Systems  Eyes: Negative.   Cardiovascular: Negative.   Musculoskeletal: Negative.   Psychiatric/Behavioral:  Negative for depression, substance abuse and suicidal ideas. The patient is nervous/anxious.   All other systems reviewed and are negative. Blood pressure (!) 141/96, pulse 76, temperature (!) 97.4 F (36.3 C), temperature source Oral, resp. rate 16, SpO2 100 %. There is no height or weight on file to calculate BMI.  Treatment Plan Summary: Daily contact with patient to assess and evaluate symptoms and progress in treatment and Medication management  -restarted on Risperdal 2 mg in tab -CSW to continue seeking inpatient admission -Attempting to locate last injection mg and medication patient received?  Oneta Rack, NP 03/07/2021 2:39 PM

## 2021-03-07 NOTE — ED Notes (Signed)
Pt came to nurses station and stated "I am seeing spirits and can I go fuck them up". When this nurse asked the patient what he meant he asked if he could have some water. Pt was given water. The patient was then offered some trazodone to help him sleep however he stated he didn't want any medications.

## 2021-03-07 NOTE — ED Notes (Signed)
Patient wanting to leave, patient is voluntary - provider said he can leave AMA - med prior to leaving administered

## 2021-03-07 NOTE — Progress Notes (Signed)
Inpatient Behavioral Health Placement  Pt meets inpatient criteria per Hillery Jacks, NP. There are no appropriate beds at [facility]  Referral was sent to the following facilities;   Destination Service Provider Address Phone [facility]  [address], [place] [address] [phone]  [facility]  [address] [place], [place] [address] [phone]  [facility]  [address] [place] [address] [phone]  [facility]  [address] [place] [address] [phone]  [facility]  [address] [place] [address] [phone]  [facility]  [address], [place] [address] [phone]  [facility]  [address] [place] [address] [phone]  [facility]  [address] [place] [address] [phone]  [facility]  [address] [place], [place] [address] [phone]  [facility]  [address] [place] [address] [phone]  [facility]         Situation ongoing,  CSW will follow up.   Maryjean Ka, MSW, LCSWA 03/07/2021  @ 2:19 PM

## 2021-03-07 NOTE — BH Assessment (Signed)
Comprehensive Clinical Assessment (CCA) Note  03/07/2021 Garrett Bolton [id number]  Discharge Disposition: Leandro Reasoner, NP, reviewed pt's chart and information and met with pt face-to-face and determined pt meets inpatient criteria. This information will be relayed to multiple hospitals, including [facility], for potential placement. Pt to remain at the [facility] for continuous assessment at this time.  The patient demonstrates the following risk factors for suicide: Chronic risk factors for suicide include: psychiatric disorder of Schizophrenia . Acute risk factors for suicide include: family or marital conflict, unemployment, and social withdrawal/isolation. Protective factors for this patient include: positive social support. Considering these factors, the overall suicide risk at this point appears to be none. Patient is not appropriate for outpatient follow up.  Therefore, no sitter is recommended for suicide precautions.  [place] [facility] from 03/07/2021 in [facility] from 02/21/2021 in [facility] ED from 01/31/2021 in [facility]  C-SSRS RISK CATEGORY No Risk Error: Q3, 4, or 5 should not be populated when Q2 is No No Risk     Chief Complaint:  Chief Complaint  Patient presents with   Altered Mental Status   Hallucinations   Visit Diagnosis: F20.9, Schizophrenia  CCA Screening, Triage and Referral (STR) Garrett Bolton is a  20 year old patient who was brought to the [facility] via [employer]. When asked why he was brought to the [facility], pt states, "I've been hearing a lot of voices for a few weeks. I've been feeling depressed. I think something killing myself in my sleep."   Pt states he's been experiencing these thoughts/hallucinations for 3 weeks. Pt denies SI, a hx of SI, any prior attempts to kill himself, or a plan to kill himself. Pt denies any prior hospitalizations for mental health concerns, but a  review of pt's record indicates he was at [facility] from 01/11/2021 - 01/18/2021, 02/27/2020 - 03/04/2020, and 04/25/2019 - 04/29/2019. Pt denies HI, VH, NSSIB, access to guns/weapons, engagement with the legal system, and SA.  Pt appears to be experiencing AMS, including difficulties answering questions or taking long pauses prior to answering questions. Pt presented with a flat affect.  Pt's orientation and memory were UTA due to AMS. Pt was guarded throughout the assessment process. Pt's insight, judgement, and impulse control is poor at this time.  Patient Reported Information How did you hear about [place]? Legal System  What Is the Reason for Your Visit/Call Today? Pt presents voluntarily via [employer]. When asked why he was brought to the [facility], pt states, "I've been hearing a lot of voices for a few weeks. I've been feeling depressed. I think something killing myself in my sleep." Pt states he's been experiencing these thoughts/hallucinations for 3 weeks. Pt denies SI, a hx of SI, any prior attempts to kill himself, or a plan to kill himself. Pt denies any prior hospitalizations for mental health concerns, but a review of pt's record indicates he was at [facility] from 01/11/2021 - 01/18/2021, 02/27/2020 - 03/04/2020, and 04/25/2019 - 04/29/2019. Pt denies HI, VH, NSSIB, access to guns/weapons, engagement with the legal system, and SA.  How Long Has This Been Causing You Problems? > than 6 months  What Do You Feel Would Help You the Most Today? Medication(s)   Have You Recently Had Any Thoughts About Hurting Yourself? No  Are You Planning to Commit Suicide/Harm Yourself At This time? No   Have you Recently Had Thoughts About [place]? No  Are You Planning to Harm  Someone at This Time? No  Explanation: N/A   Have You Used Any Alcohol or Drugs in the Past 24 Hours? No  How Long Ago Did You Use Drugs or Alcohol? No data recorded What Did You Use and How Much? Pt denies   Do You Currently  Have a Therapist/Psychiatrist? No  Name of Therapist/Psychiatrist: No data recorded  Have You Been Recently Discharged From Any Office Practice or Programs? No  Explanation of Discharge From Practice/Program: No data recorded    CCA Screening Triage Referral Assessment Type of Contact: Face-to-Face  Telemedicine Service Delivery:   Is this Initial or Reassessment? No data recorded Date Telepsych consult ordered in [facility]:  No data recorded Time Telepsych consult ordered in [facility]L:  No data recorded Location of Assessment: [facility] Services  Provider Location: [facility]   Collateral Involvement: Pt declined to provide verbal consent for clinician to make contact with his sister, [name], whom he lives with.   Does Patient Have a Stage manager Guardian? No data recorded Name and Contact of Legal Guardian: No data recorded If Minor and Not Living with Parent(s), Who has Custody? N/A  Is CPS involved or ever been involved? Never  Is APS involved or ever been involved? Never   Patient Determined To Be At Risk for Harm To Self or Others Based on Review of Patient Reported Information or Presenting Complaint? Yes, for Self-Harm  Method: No Plan  Availability of Means: No access or NA  Intent: Vague intent or NA  Notification Required: No need or identified person  Additional Information for Danger to Others Potential: Active psychosis; Previous attempts  Additional Comments for Danger to Others Potential: None noted  Are There Guns or Other Weapons in Your Home? No  Types of Guns/Weapons: No data recorded Are These Weapons Safely Secured?                            No data recorded Who Could Verify You Are Able To Have These Secured: No data recorded Do You Have any Outstanding Charges, Pending Court Dates, Parole/Probation? Pt denies, though on 01/11/2021 pt shared he is on probation for being involved in a high speed chase.  Contacted To  Inform of Risk of Harm To Self or Others: Other: Comment (Pt's family is aware)    Does Patient Present under Involuntary Commitment? No  IVC Papers Initial File Date: 02/21/21   [place] of Residence: [place]   Patient Currently Receiving the Following Services: Not Receiving Services   Determination of Need: Emergent (2 hours)   Options For Referral: Medication Management; Inpatient Hospitalization; Outpatient Therapy     CCA Biopsychosocial Patient Reported Schizophrenia/Schizoaffective Diagnosis in Past: Yes   Strengths: Pt is cooperative.   Mental Health Symptoms Depression:   Fatigue; Difficulty Concentrating   Duration of Depressive symptoms:  Duration of Depressive Symptoms: Greater than two weeks   Mania:   None   Anxiety:    Restlessness; Worrying   Psychosis:   Hallucinations; Delusions   Duration of Psychotic symptoms:  Duration of Psychotic Symptoms: Less than six months   Trauma:   N/A   Obsessions:   N/A   Compulsions:   N/A   Inattention:   N/A   Hyperactivity/Impulsivity:   N/A   Oppositional/Defiant Behaviors:   N/A   Emotional Irregularity:   Mood lability; Potentially harmful impulsivity   Other Mood/Personality Symptoms:   None noted  Mental Status Exam Appearance and self-care  Stature:   Average   Weight:   Thin   Clothing:   Neat/clean; Age-appropriate   Grooming:   Normal   Cosmetic use:   None   Posture/gait:   Normal   Motor activity:   Not Remarkable   Sensorium  Attention:   Distractible   Concentration:   Scattered   Orientation:   -- (UTA)   Recall/memory:   -- (UTA)   Affect and Mood  Affect:   Blunted; Flat   Mood:   Anxious   Relating  Eye contact:   Normal   Facial expression:   Depressed   Attitude toward examiner:   Guarded   Thought and Language  Speech flow:  -- (Unable to assess due to AMS)   Thought content:   -- (UTA)   Preoccupation:   --  (UTA)   Hallucinations:   Auditory   Organization:  No data recorded  [employer] of Knowledge:   Average   Intelligence:   Average   Abstraction:   Abstract   Judgement:   Fair   Reality Testing:   Unaware   Insight:   Gaps   Decision Making:   Impulsive; Vacilates   Social Functioning  Social Maturity:   Isolates; Impulsive   Social Judgement:   Naive   Stress  Stressors:   Family conflict; Legal; Housing   Coping Ability:   Deficient supports   Skill Deficits:   Interpersonal; Responsibility   Supports:   Family; Friends/Service system     Religion: Religion/Spirituality Are You A Religious Person?: No (Unable to assess due to AMS) How Might This Affect Treatment?: Unable to assess due to AMS  Leisure/Recreation: Leisure / Recreation Do You Have Hobbies?: No (Unable to assess due to AMS) Leisure and Hobbies: Unable to assess due to AMS  Exercise/Diet: Exercise/Diet Do You Exercise?: No (Unable to assess due to AMS) What Type of Exercise Do You Do?:  (Unable to assess due to AMS) How Many Times a Week Do You Exercise?:  (Unable to assess due to AMS) Have You Gained or Lost A Significant Amount of Weight in the Past Six Months?: No (Unable to assess due to AMS) Do You Follow a Special Diet?: No (Unable to assess due to AMS) Do You Have Any Trouble Sleeping?: Yes Explanation of Sleeping Difficulties: Pt states he's been having difficulties sleeping for the past several weeks   CCA Employment/Education Employment/Work Situation: Employment / Work Situation Employment Situation:  (Unable to assess due to AMS) Work Stressors: N/A Patient's Job has Been Impacted by Current Illness:  (Unable to assess due to AMS) Describe how Patient's Job has Been Impacted: Unable to assess due to AMS Has Patient ever Been in the [employer]?: No  Education: Education Is Patient Currently Attending School?: No Last Grade Completed: 12 Did You  Attend College?: No Did You Have An Individualized Education Program (IIEP):  (Unable to assess due to AMS) Did You Have Any Difficulty At School?:  (Unable to assess due to AMS) Patient's Education Has Been Impacted by Current Illness: No   CCA Family/Childhood History Family and Relationship History: Family history Marital status: Single Does patient have children?: No  Childhood History:  Childhood History By whom was/is the patient raised?: Mother, Mother/father and step-parent, Sibling, Grandparents Did patient suffer any verbal/emotional/physical/sexual abuse as a child?: No (Unable to assess due to AMS) Did patient suffer from severe childhood neglect?: No (Unable to assess  due to AMS) Has patient ever been sexually abused/assaulted/raped as an adolescent or adult?: No (Unable to assess due to AMS) Was the patient ever a victim of a crime or a disaster?: No (Unable to assess due to AMS) Witnessed domestic violence?: No (Unable to assess due to AMS) Has patient been affected by domestic violence as an adult?: No (Unable to assess due to AMS)  Child/Adolescent Assessment:     CCA Substance Use Alcohol/Drug Use: Alcohol / Drug Use Pain Medications: See MAR Prescriptions: See MAR Over the Counter: See MAR History of alcohol / drug use?: No history of alcohol / drug abuse (Pt denies) Longest period of sobriety (when/how long): Unknown Negative Consequences of Use:  (Unable to assess due to AMS) Withdrawal Symptoms:  (Unable to assess due to AMS) Substance #1 Name of Substance 1: Marijuana 1 - Age of First Use: UTA 1 - Amount (size/oz): Blunt 1 - Frequency: Every other day 1 - Duration: Ongoing 1 - Last Use / Amount: Per pt report, 6 mos ago 1 - Method of Aquiring: NA 1- Route of Use: Smoking                       ASAM's:  Six Dimensions of Multidimensional Assessment  Dimension 1:  Acute Intoxication and/or Withdrawal Potential:   Dimension 1:   Description of individual's past and current experiences of substance use and withdrawal: Pt reports that he smoke marijuana occassions  Dimension 2:  Biomedical Conditions and Complications:   Dimension 2:  Description of patient's biomedical conditions and  complications: Pt did not report biomedical conditons  Dimension 3:  Emotional, Behavioral, or Cognitive Conditions and Complications:  Dimension 3:  Description of emotional, behavioral, or cognitive conditions and complications: Schizophrenia  Dimension 4:  Readiness to Change:  Dimension 4:  Description of Readiness to Change criteria: Contemplation  Dimension 5:  Relapse, Continued use, or Continued Problem Potential:  Dimension 5:  Relapse, continued use, or continued problem potential critiera description: Contemplation  Dimension 6:  Recovery/Living Environment:  Dimension 6:  Recovery/Iiving environment criteria description: Pt reports that he lives with his sister  ASAM Severity Score: ASAM's Severity Rating Score: 3  ASAM Recommended Level of Treatment: ASAM Recommended Level of Treatment: Level I Outpatient Treatment   Substance use Disorder (SUD) Substance Use Disorder (SUD)  Checklist Symptoms of Substance Use: Continued use despite having a persistent/recurrent physical/psychological problem caused/exacerbated by use, Evidence of tolerance, Presence of craving or strong urge to use, Recurrent use that results in a failure to fulfill major role obligations (work, school, home)  Recommendations for Services/Supports/Treatments: Recommendations for Services/Supports/Treatments Recommendations For Services/Supports/Treatments: Inpatient Hospitalization, Medication Management, Individual Therapy  Discharge Disposition: Discharge Disposition Medical Exam completed: Yes Disposition of Patient: Admit Mode of transportation if patient is discharged/movement?: N/A  Leandro Reasoner, NP, reviewed pt's chart and information and met with pt  face-to-face and determined pt meets inpatient criteria. This information will be relayed to multiple hospitals, including [facility], for potential placement. Pt to remain at the [facility] for continuous assessment at this time.  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

## 2021-03-07 NOTE — ED Notes (Signed)
Currently resting quietly. Breathing even and unlabored. In view of nursing station and no distress noted.  Will continue to monitor for safety.

## 2021-03-07 NOTE — BH Assessment (Signed)
Clinician sent an IM to [facility] Joann, RN, at [phone] requesting review for potential placement at [facility]. At 0331 Joann responded, stating she does not have an appropriate bed for pt at this time.

## 2021-03-18 ENCOUNTER — Telehealth (HOSPITAL_COMMUNITY): Payer: Self-pay | Admitting: Pediatrics

## 2021-03-18 NOTE — BH Assessment (Signed)
[facility] Discharge Follow Up   Writer attempted to make contact with patient today and was unsuccessful.  Patient phone is disconnected.  Per chart review, patient was given an Invega injection and signed AMA paperwork.

## 2021-07-26 ENCOUNTER — Other Ambulatory Visit: Payer: Self-pay

## 2021-07-26 ENCOUNTER — Encounter (HOSPITAL_COMMUNITY): Payer: Self-pay

## 2021-07-26 ENCOUNTER — Emergency Department (HOSPITAL_COMMUNITY)
Admission: EM | Admit: 2021-07-26 | Discharge: 2021-07-26 | Disposition: A | Discharge status: Home / Self Care | Payer: Federal, State, Local not specified - PPO | Attending: Emergency Medicine | Admitting: Emergency Medicine

## 2021-07-26 DIAGNOSIS — F32A Depression, unspecified: Secondary | ICD-10-CM | POA: Diagnosis present

## 2021-07-26 DIAGNOSIS — R443 Hallucinations, unspecified: Secondary | ICD-10-CM | POA: Diagnosis not present

## 2021-07-26 MED ORDER — RISPERIDONE 2 MG PO TBDP
2.0000 mg | ORAL_TABLET | Freq: Two times a day (BID) | ORAL | 0 refills | Status: DC
Start: 2021-07-26 — End: 2021-08-14
  Filled 2021-07-27: qty 6, 3d supply, fill #0
  Filled 2021-07-27: qty 54, 27d supply, fill #0

## 2021-07-26 NOTE — ED Triage Notes (Signed)
Pt BIB [facility] d/t increased depressed thoughts. Pt stated his "chemical balance is off". Pt also experiencing some hallucinations. Pt previously seen at [facility]. Denies HI/SI ?

## 2021-07-26 NOTE — ED Provider Notes (Signed)
?[facility] ?Provider Note ? ? ?CSN: [id number] ?Arrival date & time: 07/26/21  [id number] ? ?  ? ?History ? ?Chief Complaint  ?Patient presents with  ? Depression  ? ? ?Garrett Miklo Threats is a 21 y.o. male with past medical history significant for schizophrenia spectrum disorder, previous cannabis induced psychotic disorder who presents with concern for worsening depression, feeling that his "chemical balance is off".  Patient reports that he was "messing around with a girl, and the other boy started casting hexes on him".  Patient reports that he normally takes risperidone but has not taken this medication in a few months.  He endorses some increased auditory hallucinations.  He reports that overall he feels okay, has some support system lives at home with his sister.  He denies any suicidal ideation, history of previous suicide attempt, history of homicidal ideation or current homicidal ideation.  He denies any recent or current drug or alcohol abuse. ? ? ?Depression ? ? ?  ? ?Home Medications ?Prior to Admission medications   ?Medication Sig Start Date End Date Taking? Authorizing Provider  ?risperiDONE (RISPERDAL M-TABS) 2 MG disintegrating tablet Dissolve 1 tablet (2 mg total) by mouth 2 (two) times daily. 07/26/21 08/25/21  Blaire Hodsdon, Darrick Meigs H, PA-C  ?   ? ?Allergies    ?Patient has no known allergies.   ? ?Review of Systems   ?Review of Systems  ?Psychiatric/Behavioral:  Positive for depression and hallucinations.   ?All other systems reviewed and are negative. ? ?Physical Exam ?Updated Vital Signs ?BP 132/83 (BP Location: Left Arm)   Pulse 97   Temp 99 ?F (37.2 ?C) (Oral)   Resp 16   SpO2 95%  ?Physical Exam ?Vitals and nursing note reviewed.  ?Constitutional:   ?   General: He is not in acute distress. ?   Appearance: Normal appearance.  ?   Comments: Patient not disheveled, not ill-appearing.  ?HENT:  ?   Head: Normocephalic and atraumatic.  ?Eyes:  ?   General:     ?    Right eye: No discharge.     ?   Left eye: No discharge.  ?Cardiovascular:  ?   Rate and Rhythm: Normal rate and regular rhythm.  ?Pulmonary:  ?   Effort: Pulmonary effort is normal. No respiratory distress.  ?Musculoskeletal:     ?   General: No deformity.  ?Skin: ?   General: Skin is warm and dry.  ?Neurological:  ?   Mental Status: He is alert and oriented to person, place, and time.  ?   Comments: Patient is pleasant, with overall normal affect.  He does seem to be responding to some internal stimuli, however answers all questions appropriately, with some delusions, hallucinations apparent.  He is easily directable, denies SI, HI.  He reports some auditory hallucinations which are similar compared to his baseline.  ?Psychiatric:     ?   Mood and Affect: Mood normal.     ?   Behavior: Behavior normal.  ? ? ?ED Results / Procedures / Treatments   ?Labs ?(all labs ordered are listed, but only abnormal results are displayed) ?Labs Reviewed - No data to display ? ?EKG ?Garrett Bolton ? ?Radiology ?No results found. ? ?Procedures ?Procedures  ? ? ?Medications Ordered in ED ?Medications - No data to display ? ?ED Course/ Medical Decision Making/ A&P ?  ?                        ?  Medical Decision Making ? ?This is an overall well-appearing 21 year old male with a past medical history significant for schizophrenia who presents with concern for worsening depression, auditory hallucinations, concerned that his "chemical balance is off". After discussion with the patient, do not believe he meets criteria for involuntary commitment or inpatient admission for psychosis.  He does have some auditory hallucinations which seem similar compared to his baseline.  He is not taking his prescribed medication at this time.  Considered evaluation by TTS at our facility versus outpatient referral to [facility] versus refilling prescription of patient's risperidone and giving him outpatient resources to follow-up.  Patient reports that he would be  comfortable with refilling his medication, following up outpatient as needed.  He is discharged in stable condition at this time with a prescription for risperidone.  Extensive return precautions given, encouraged to follow-up with [facility] as needed.  We will not refer directly or transfer at this time as patient believes that he can manage these thoughts on his own with a refill of his medication.  Encouraged him to follow-up as needed if feelings of depression worsen, patient begins to experience suicidal ideation, homicidal ideation, or worsening hallucinations.  Patient expresses understanding, and agrees to plan. ?Final Clinical Impression(s) / ED Diagnoses ?Final diagnoses:  ?Depression, unspecified depression type  ?Hallucinations  ? ? ?Rx / DC Orders ?ED Discharge Orders   ? ?      Ordered  ?  risperiDONE (RISPERDAL M-TABS) 2 MG disintegrating tablet  2 times daily       ? 07/26/21 0710  ? ?  ?  ? ?  ? ? ?  ?Anselmo Pickler, PA-C ?07/26/21 [id number] ? ?  ?Orpah Greek, MD ?07/27/21 0719 ? ?

## 2021-07-26 NOTE — Discharge Instructions (Addendum)
Please return to the emergency department if your symptoms worsen or fail to improve despite beginning your risperdone again. You can follow up with [facility] additionally to discuss your symptoms. It was a pleasure taking care of you today. ?

## 2021-07-27 ENCOUNTER — Other Ambulatory Visit: Payer: Self-pay

## 2021-07-29 ENCOUNTER — Other Ambulatory Visit: Payer: Self-pay

## 2021-07-29 ENCOUNTER — Emergency Department (HOSPITAL_COMMUNITY)
Admission: EM | Admit: 2021-07-29 | Discharge: 2021-07-30 | Discharge status: Against Medical Advice | Payer: Federal, State, Local not specified - PPO | Attending: Emergency Medicine | Admitting: Emergency Medicine

## 2021-07-29 ENCOUNTER — Encounter (HOSPITAL_COMMUNITY): Payer: Self-pay | Admitting: *Deleted

## 2021-07-29 DIAGNOSIS — Z5321 Procedure and treatment not carried out due to patient leaving prior to being seen by health care provider: Secondary | ICD-10-CM | POA: Diagnosis not present

## 2021-07-29 DIAGNOSIS — S00562A Insect bite (nonvenomous) of oral cavity, initial encounter: Secondary | ICD-10-CM | POA: Diagnosis not present

## 2021-07-29 DIAGNOSIS — S00502A Unspecified superficial injury of oral cavity, initial encounter: Secondary | ICD-10-CM | POA: Diagnosis present

## 2021-07-29 DIAGNOSIS — W57XXXA Bitten or stung by nonvenomous insect and other nonvenomous arthropods, initial encounter: Secondary | ICD-10-CM | POA: Insufficient documentation

## 2021-07-29 NOTE — ED Triage Notes (Signed)
The pt iarrived by gems from the depo  c/o an insect bite to his tongue no swelling  then he told the paramedics that  he may have taken a pill yesterday or one week ago ?

## 2021-08-03 ENCOUNTER — Other Ambulatory Visit: Payer: Self-pay

## 2021-08-14 ENCOUNTER — Ambulatory Visit (HOSPITAL_COMMUNITY)
Admission: EM | Admit: 2021-08-14 | Discharge: 2021-08-14 | Disposition: A | Discharge status: Home / Self Care | Payer: Federal, State, Local not specified - PPO | Attending: Behavioral Health | Admitting: Behavioral Health

## 2021-08-14 ENCOUNTER — Other Ambulatory Visit: Payer: Self-pay

## 2021-08-14 DIAGNOSIS — F209 Schizophrenia, unspecified: Secondary | ICD-10-CM

## 2021-08-14 DIAGNOSIS — Z20822 Contact with and (suspected) exposure to covid-19: Secondary | ICD-10-CM | POA: Insufficient documentation

## 2021-08-14 DIAGNOSIS — F17200 Nicotine dependence, unspecified, uncomplicated: Secondary | ICD-10-CM | POA: Insufficient documentation

## 2021-08-14 LAB — CBC WITH DIFFERENTIAL/PLATELET
Abs Immature Granulocytes: 0.04 10*3/uL (ref 0.00–0.07)
Basophils Absolute: 0.1 10*3/uL (ref 0.0–0.1)
Basophils Relative: 1 %
Eosinophils Absolute: 0.1 10*3/uL (ref 0.0–0.5)
Eosinophils Relative: 1 %
HCT: 44.7 % (ref 39.0–52.0)
Hemoglobin: 13.7 g/dL (ref 13.0–17.0)
Immature Granulocytes: 0 %
Lymphocytes Relative: 22 %
Lymphs Abs: 2.3 10*3/uL (ref 0.7–4.0)
MCH: 23.3 pg — ABNORMAL LOW (ref 26.0–34.0)
MCHC: 30.6 g/dL (ref 30.0–36.0)
MCV: 75.9 fL — ABNORMAL LOW (ref 80.0–100.0)
Monocytes Absolute: 0.5 10*3/uL (ref 0.1–1.0)
Monocytes Relative: 5 %
Neutro Abs: 7.5 10*3/uL (ref 1.7–7.7)
Neutrophils Relative %: 71 %
Platelets: 322 10*3/uL (ref 150–400)
RBC: 5.89 MIL/uL — ABNORMAL HIGH (ref 4.22–5.81)
RDW: 12.8 % (ref 11.5–15.5)
WBC: 10.4 10*3/uL (ref 4.0–10.5)
nRBC: 0 % (ref 0.0–0.2)

## 2021-08-14 LAB — POCT URINE DRUG SCREEN - MANUAL ENTRY (I-SCREEN)
POC Amphetamine UR: NOT DETECTED
POC Buprenorphine (BUP): NOT DETECTED
POC Cocaine UR: NOT DETECTED
POC Marijuana UR: POSITIVE — AB
POC Methadone UR: NOT DETECTED
POC Methamphetamine UR: NOT DETECTED
POC Morphine: NOT DETECTED
POC Oxazepam (BZO): NOT DETECTED
POC Oxycodone UR: NOT DETECTED
POC Secobarbital (BAR): NOT DETECTED

## 2021-08-14 LAB — HEMOGLOBIN A1C
Hgb A1c MFr Bld: 4.4 % — ABNORMAL LOW (ref 4.8–5.6)
Mean Plasma Glucose: 79.58 mg/dL

## 2021-08-14 LAB — COMPREHENSIVE METABOLIC PANEL
ALT: 19 U/L (ref 0–44)
AST: 29 U/L (ref 15–41)
Albumin: 4.7 g/dL (ref 3.5–5.0)
Alkaline Phosphatase: 84 U/L (ref 38–126)
Anion gap: 8 (ref 5–15)
BUN: 11 mg/dL (ref 6–20)
CO2: 28 mmol/L (ref 22–32)
Calcium: 10.3 mg/dL (ref 8.9–10.3)
Chloride: 105 mmol/L (ref 98–111)
Creatinine, Ser: 0.85 mg/dL (ref 0.61–1.24)
GFR, Estimated: >60 mL/min (ref >60)
Glucose, Bld: 93 mg/dL (ref 70–99)
Potassium: 4.5 mmol/L (ref 3.5–5.1)
Sodium: 141 mmol/L (ref 135–145)
Total Bilirubin: 0.8 mg/dL (ref 0.3–1.2)
Total Protein: 7 g/dL (ref 6.5–8.1)

## 2021-08-14 LAB — TSH: TSH: 0.755 u[IU]/mL (ref 0.350–4.500)

## 2021-08-14 LAB — ETHANOL: Alcohol, Ethyl (B): <10 mg/dL (ref <10)

## 2021-08-14 LAB — RESP PANEL BY RT-PCR (FLU A&B, COVID) ARPGX2
Influenza A by PCR: NEGATIVE
Influenza B by PCR: NEGATIVE
SARS Coronavirus 2 by RT PCR: NEGATIVE

## 2021-08-14 LAB — POC SARS CORONAVIRUS 2 AG -  ED: SARS Coronavirus 2 Ag: NEGATIVE

## 2021-08-14 LAB — POC SARS CORONAVIRUS 2 AG: SARSCOV2ONAVIRUS 2 AG: NEGATIVE

## 2021-08-14 MED ORDER — RISPERIDONE 2 MG PO TBDP
2.0000 mg | ORAL_TABLET | Freq: Two times a day (BID) | ORAL | Status: DC
  Administered 2021-08-14: 2 mg via ORAL
  Filled 2021-08-14: qty 1
  Filled 2021-08-14: qty 14

## 2021-08-14 MED ORDER — MAGNESIUM HYDROXIDE 400 MG/5ML PO SUSP: 30.0000 mL | Freq: Every day | ORAL | Status: DC | PRN

## 2021-08-14 MED ORDER — RISPERIDONE 2 MG PO TBDP: 2.0000 mg | ORAL_TABLET | Freq: Two times a day (BID) | ORAL | 0 refills | Status: DC

## 2021-08-14 MED ORDER — ACETAMINOPHEN 325 MG PO TABS: 650.0000 mg | ORAL_TABLET | Freq: Four times a day (QID) | ORAL | Status: DC | PRN

## 2021-08-14 MED ORDER — HYDROXYZINE HCL 25 MG PO TABS: 25.0000 mg | ORAL_TABLET | Freq: Three times a day (TID) | ORAL | Status: DC | PRN

## 2021-08-14 MED ORDER — ALUM & MAG HYDROXIDE-SIMETH 200-200-20 MG/5ML PO SUSP: 30.0000 mL | ORAL | Status: DC | PRN

## 2021-08-14 MED ORDER — TRAZODONE HCL 50 MG PO TABS: 50.0000 mg | ORAL_TABLET | Freq: Every evening | ORAL | Status: DC | PRN

## 2021-08-14 NOTE — ED Notes (Signed)
Patient continues to respond impulsively aeb slapping his leg, foot and jerking motions with vocal outburst. Patient is sitting holding his head in his hands. Patient is safe on unit with continued monitoring.

## 2021-08-14 NOTE — Progress Notes (Signed)
   08/14/21 0603  [facility] Triage Screening (Walk-ins at [facility] only)  How Did You Hear About [place]? Self  What Is the Reason for Your Visit/Call Today? Pt reports that he called LE to bring him to [facility]. CSW observed pt to exhibit bizarre behavior and undetermined if pt is under the influence of substances or if pt is exhibiting signs of mania. Pt started laughing and stating that his name was [place]. Then pt began making weird noises and pretended to light a lighter and smoke a black and mild and then stated, "I want to burn people with the black and mild." Pt admits to AVH and reports that the trees are talking to him and that they are out to get him/ Pt denies SI but admits to HI, however, does not report who the HI is towards just reports that he will burn them with the black and mild. Pt denies SUD or alcohol. Pt reports that it has been two weeks since he smoked marijuana. Pt states that he lives with his sister and denies CSW to make collateral contact. Pt is emergent.  How Long Has This Been Causing You Problems? 1-6 months  Have You Recently Had Any Thoughts About Hurting Yourself? No  Are You Planning to Commit Suicide/Harm Yourself At This time? No  Have you Recently Had Thoughts About Hurting Someone [name]? Yes  How long ago did you have thoughts of harming others? Today  Are You Planning To Harm Someone At This Time? Yes  Explanation: Pt did not state who he plans to harm but that he will harm said person who a black and mild.  Are you currently experiencing any auditory, visual or other hallucinations? Yes  Please explain the hallucinations you are currently experiencing: 'The trees are talking to me."  Have You Used Any Alcohol or Drugs in the Past 24 Hours? No  Do you have any current medical co-morbidities that require immediate attention? No  Clinician description of patient physical appearance/behavior: CSW observed pt to exhibit bizarre behavior and undetermined if pt is under the influence  of substances or if pt is exhibiting signs of mania. Pt started laughing and stating that his name was [place].  What Do You Feel Would Help You the Most Today? Social Support;Treatment for Depression or other mood problem  If access to [facility] was not available, would you have sought care in the Emergency Department? Yes  Determination of Need Emergent (2 hours)  Options For Referral Inpatient Hospitalization;Mobile Crisis;Intensive Outpatient Therapy;Medication Management   Maryjean Ka, MSW, [facility] 08/14/2021 6:57 AM

## 2021-08-14 NOTE — ED Notes (Signed)
Pt pacing in the observation area. Asking "if it is a long process to leave the hospital". Informed Pt that he has just recently arrived and should lay down to get some rest, to stay with [place] today and it is cold and rainy outside. Pt continues to pace the unit. Safety maintained and will continue to monitor.

## 2021-08-14 NOTE — Discharge Instructions (Signed)

## 2021-08-14 NOTE — ED Notes (Signed)
Pt stated that he wanted to leave and needed to go to work. Informed provider via secure chat.

## 2021-08-14 NOTE — ED Notes (Signed)
Patient was admitted to the obs unit today. Patient is refusing to answer questions. Patient will be reassessed for homicidal and suicidal ideations. Patient whispered softly when asked about SI/HI and AVH. It was difficult to determine patient's response.Patient became agitated when writer asked patient if he would like to wear a scrub top under his jacket, that he is refusing to take off. Patient has been offered something to eat or drink, patient declined. Patient did report he was tired and wanted to rest.

## 2021-08-14 NOTE — ED Notes (Signed)
Discharge instructions provided and Pt stated understanding. Pt alert, orient and ambulatory prior to d/c from facility. Sample prescription provided and informed Pt that he would need to pick up his prescription at [facility]. Personal belongings returned from locker number 28. Pt escorted to the front lobby to d/c from facility. Safety maintained.

## 2021-08-14 NOTE — ED Provider Notes (Signed)
Behavioral Health Urgent Care Medical Screening Exam  Patient Name: Garrett Bolton MRN: [id number] Date of Evaluation: 08/14/21 Diagnosis:  Final diagnoses:  Schizophrenia, unspecified type (HCC)    History of Present illness: Garrett Bolton is a 21 year old male patient who presents to the [facility] behavioral health urgent care voluntarily as a walk-in accompanied by Patent examiner.  Patient seen and evaluated face-to-face by this provider, and chart reviewed. Patient has a past psychiatric history significant of schizophrenia, unspecified type. On evaluation, patient presents disheveled with a body odor. He is alert and partially oriented to person. He is disoriented to place, and states he is in an "ice box." He states that he does not know the month, he states that he does not know the year and then states that it is 2023. Patient appears to be thought blocking AEB delayed responses and slow speech. His thoughts are disorganized with loose association. He appears to be responding to internal and external stimuli  AEB fleeting eye contact, constantly scanning the room, and bizarre body gestures with this mouth and hands. He is unable to recall why he was brought to the behavioral health urgent care for an evaluation. He states, he lives with "visualities." When asked to elaborate, he states "ghosts and people who wear capes." He denies SI and states, "when the wind is blowing I take a blacky mild and sizzz... and the wind screams." He reports HI towards the "ghost." When asked if he is hearing voices or seeing things other people cannot hear or see, he states, "I was before." He reports no sleep. He describes his appetite as eating "salty stuff like chips." He reports smoking "a little bit of weed." He states that he stopped 2 or 3 weeks ago. He denies drinking alcohol by nodding his head no. He nods his head no to taking prescribed medications for mental health.   He gives verbal  consent to contact his mother Treveon Bourcier [phone] for collateral information. Ms. Werntz states that the patient lives with this sister Ranard Harte [phone]. She states that the patient keeps using drugs (marijuana) and it makes him hallucinate and he thinks people are trying to hurt him. She states that he has been talking out of his head. She states that she lives two hours away from the patient but sees him on facetime. She states that she face-timed the patient last night but she didn't see him because he kept hiding his face from the camera.   Update reevaluation at noon per nursing request from patient: Patient reevaluated by this provider. On evaluation, patient is alert and oriented to person, place, time and situation. His thought process is relevant and linear. His speech is clear and coherent. He is calm and cooperative. He  is not making bizarre gestures. He tells me that he is ready to go and wants to discharge. He states that he made everything up when he came in because he lives in poverty and wants to get a check every month. He states that [place] with his sister, her 3 children and her boyfriend. He states that this morning he was sitting on the porch and decided to call the police to come in for an evaluation so that he can get a check every month. He states that he is not hallucinating or crazy and that he made everything up. He states that he has been doing fine and has not been to a psychiatric hospital  since last year. He states that he has his medication at home and will take the prescription to the pharmacy so that he can take his medications. He states that he does not have an outpatient psychiatrist for medication management. He gives permission for me to speak with his sister to obtain collateral information.   I spoke to the patient's sister Reason Helzer [phone]. [name] states that she has been telling the patient to get  help for the past two weeks. She states that he went to the hospital around the beginning of this month and was given a prescription for his medication but they could not afford the $15.00 co-pay. She states that she is glad the patient came in for an evaluation this morning because he needs to get back on his medications. She states that patient hasn't been bathing, eating much, starring at the walls and stopped going to work for the past two weeks. She states that the patient need to get back on his medications. She was advised that the patient is requesting to discharge and no longer wants to stay overnight for treatment. She states that she is willing to pick the patient up from the facility today if he can restart his medications and receive sample medications until she can get the money to pay for his prescription.   Plan. I dicussed with the patient taking Risperdal 2 mg po BID. Patient was administered Risperdal 2 mg po once. Patient advised to follow up here at the [facility] outpatient for medication management. Patient verbalizes understanding. I discussed with the patient's sister that the patient will need to attend open access for the initial assessment to begin medication management. She was advised that open access hours are on Mondays, Wednesdays, and Fridays from 8-11 am, patient recommended to show up at 7:30 am.    Patient was admitted to the [facility] continuous assessment unit for overnight observation. Labs obtained (cmp, cbc, uds, bal, covid, a1c, lipid, tsh and ekg). Patient was observed on the unit from 7:30 am to 2:30 PM. Safety planning established with patient's sister [name]. Patient discharged with outpatient follow up.  Psychiatric Specialty Exam  Presentation  General Appearance:Disheveled  Eye Contact:Fleeting  Speech:Slow  Speech Volume:Decreased  Handedness:Right   Mood and Affect  Mood:Anxious  Affect:Congruent   Thought Process  Thought  Processes:Disorganized  Descriptions of Associations:Loose  Orientation:Partial  Thought Content:Illogical; Scattered  Diagnosis of Schizophrenia or Schizoaffective disorder in past: Yes  Duration of Psychotic Symptoms: Less than six months  Hallucinations:-- (UTA)  Ideas of Reference:Delusions  Suicidal Thoughts:No  Homicidal Thoughts:No   Sensorium  Memory:Immediate Poor; Recent Poor; Remote Poor  Judgment:Poor  Insight:Poor   Executive Functions  Concentration:Poor  Attention Span:Poor  Recall:Poor  Fund of Knowledge:Poor  Language:Poor   Psychomotor Activity  Psychomotor Activity:Restlessness   Assets  Assets:Desire for Improvement   Sleep  Sleep:Poor  Number of hours: 7   No data recorded  Physical Exam: Physical Exam HENT:     Head: Normocephalic.     Nose: Nose normal.  Eyes:     Conjunctiva/sclera: Conjunctivae normal.  Musculoskeletal:        General: Normal range of motion.  Neurological:     Mental Status: He is alert and oriented to person, place, and time.   Review of Systems  Constitutional: Negative.   HENT: Negative.    Eyes: Negative.   Respiratory: Negative.    Cardiovascular: Negative.   Gastrointestinal: Negative.   Genitourinary: Negative.   Musculoskeletal:  Negative.   Skin: Negative.   Neurological: Negative.   Endo/Heme/Allergies: Negative.   Blood pressure 117/81, pulse 63, temperature 98.3 F (36.8 C), temperature source Tympanic, resp. rate 18, SpO2 100 %. There is no height or weight on file to calculate BMI.  Musculoskeletal: Strength & Muscle Tone: within normal limits Gait & Station: normal Patient leans: N/A   [facility] MSE Discharge Disposition for Follow up and Recommendations: Based on my evaluation the patient does not appear to have an emergency medical condition and can be discharged with resources and follow up care in outpatient services for Medication Management and Individual Therapy  Lab  Orders         Resp Panel by RT-PCR (Flu A&B, Covid) Anterior Nasal Swab         CBC with Differential/Platelet         Comprehensive metabolic panel         Hemoglobin A1c         Ethanol         TSH         POCT Urine Drug Screen - (I-Screen)         POC SARS Coronavirus 2 Ag-ED - Nasal Swab         POC SARS Coronavirus 2 Ag    EKG  Medications  risperiDONE  2 mg Oral BID  7 day sample medications provided for Risperdal. A -30 day [facility].   Follow up recommendations:  Follow-up Information     [facility].   Specialty: Urgent Care Why: Walk-in hours for open access for psychiatry are Mondays, Wednesdays and Fridays 8 am to 11 pm. Please arrive at 7:30 am. Open access for therapy are Mondays, Tuesdays, and Wednesdays from 8:00 am to 11:00 pm. Please arrive at 7:30 am Contact information: [address] [place] [phone]                Layla BarterWhite, Addysin Porco L, NP 08/14/2021, 12:20 PM

## 2021-08-14 NOTE — ED Notes (Signed)
Patient given meal. 

## 2021-08-14 NOTE — ED Notes (Signed)
[facility] called to collect STAT specimens and to deliver to [facility]. ?

## 2021-08-14 NOTE — ED Notes (Signed)
Patient arrived on unit. Patient calm. Patient adjusting to unit aeb MHT orienting patient to unit. Patient safe on unit with continued monitoring.

## 2021-08-14 NOTE — ED Notes (Signed)
Patient is restless aeb sudden walks to batheroom. Patient is confused aeb going to wrong bed. Patient is responding to stimuli aeb expressing violent movement with hands jerking them to his hips. Patient is sitting on edge of bed with hands in his face. Patient is looking around his space as if he hears something aeb staff report. Patient safe on unit with continued monitoring.

## 2021-09-25 ENCOUNTER — Ambulatory Visit (HOSPITAL_COMMUNITY)
Admission: EM | Admit: 2021-09-25 | Discharge: 2021-09-28 | Disposition: A | Discharge status: Other Acute Inpatient Hospital | Payer: Federal, State, Local not specified - PPO | Attending: Behavioral Health | Admitting: Behavioral Health

## 2021-09-25 DIAGNOSIS — F22 Delusional disorders: Secondary | ICD-10-CM | POA: Diagnosis present

## 2021-09-25 DIAGNOSIS — F1290 Cannabis use, unspecified, uncomplicated: Secondary | ICD-10-CM | POA: Diagnosis not present

## 2021-09-25 DIAGNOSIS — Z20822 Contact with and (suspected) exposure to covid-19: Secondary | ICD-10-CM | POA: Insufficient documentation

## 2021-09-25 DIAGNOSIS — R456 Violent behavior: Secondary | ICD-10-CM | POA: Diagnosis not present

## 2021-09-25 DIAGNOSIS — L989 Disorder of the skin and subcutaneous tissue, unspecified: Secondary | ICD-10-CM

## 2021-09-25 DIAGNOSIS — F209 Schizophrenia, unspecified: Secondary | ICD-10-CM | POA: Diagnosis not present

## 2021-09-25 LAB — POCT URINE DRUG SCREEN - MANUAL ENTRY (I-SCREEN)
POC Amphetamine UR: NOT DETECTED
POC Buprenorphine (BUP): NOT DETECTED
POC Cocaine UR: NOT DETECTED
POC Marijuana UR: POSITIVE — AB
POC Methadone UR: NOT DETECTED
POC Methamphetamine UR: NOT DETECTED
POC Morphine: NOT DETECTED
POC Oxazepam (BZO): NOT DETECTED
POC Oxycodone UR: NOT DETECTED
POC Secobarbital (BAR): NOT DETECTED

## 2021-09-25 LAB — POC SARS CORONAVIRUS 2 AG: SARSCOV2ONAVIRUS 2 AG: NEGATIVE

## 2021-09-25 LAB — RESP PANEL BY RT-PCR (FLU A&B, COVID) ARPGX2
Influenza A by PCR: NEGATIVE
Influenza B by PCR: NEGATIVE
SARS Coronavirus 2 by RT PCR: NEGATIVE

## 2021-09-25 MED ORDER — MAGNESIUM HYDROXIDE 400 MG/5ML PO SUSP: 30.0000 mL | Freq: Every day | ORAL | Status: DC | PRN

## 2021-09-25 MED ORDER — ACETAMINOPHEN 325 MG PO TABS: 650.0000 mg | ORAL_TABLET | Freq: Four times a day (QID) | ORAL | Status: DC | PRN

## 2021-09-25 MED ORDER — TRAZODONE HCL 50 MG PO TABS
50.0000 mg | ORAL_TABLET | Freq: Every evening | ORAL | Status: DC | PRN
  Administered 2021-09-25 – 2021-09-27 (×3): 50 mg via ORAL
  Filled 2021-09-25 (×3): qty 1

## 2021-09-25 MED ORDER — DIPHENHYDRAMINE HCL 25 MG PO CAPS: 25.0000 mg | ORAL_CAPSULE | Freq: Four times a day (QID) | ORAL | Status: DC | PRN

## 2021-09-25 MED ORDER — ALUM & MAG HYDROXIDE-SIMETH 200-200-20 MG/5ML PO SUSP: 30.0000 mL | ORAL | Status: DC | PRN

## 2021-09-25 MED ORDER — RISPERIDONE 2 MG PO TBDP
2.0000 mg | ORAL_TABLET | Freq: Two times a day (BID) | ORAL | Status: DC
  Administered 2021-09-25 – 2021-09-27 (×5): 2 mg via ORAL
  Filled 2021-09-25 (×5): qty 1

## 2021-09-25 NOTE — ED Notes (Signed)
Received patient this PM. Patient is sleeping in his bed. No acute distress. Patient respirations are even and unlabored. Will continue to monitor for safety.

## 2021-09-25 NOTE — ED Notes (Signed)
Pt A&O x 4, presents under IVC. Patient IVC'd by his sister. IVC papers states "The respondent is not taking his medication as prescribed. The respondent is hearing voices and going around the home breaking things. The respondent states that his going to hunt his family and that his tied of them taking his soul, so his going to take his own soul. The respondent acted in a way that created a substantial risk of serious bodily harm and there is a reasonable probability that this conduct will be repeated." Patient SI,Denies HI or AVH.  Patient oriented  to unit and given a food to eat. Pt calm & cooperative, monitoring for safety.
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
## 2021-09-26 ENCOUNTER — Encounter (HOSPITAL_COMMUNITY): Payer: Self-pay | Admitting: Student

## 2021-09-26 NOTE — ED Notes (Signed)
Patient alert and oriented x 4. Denies SI and HI. Patient resting quietly in bed with eyes closed, Respirations equal and unlabored, skin warm and dry, NAD noted. Routine safety checks conducted according to facility protocol. Will continue to monitor for safety and update as needed.

## 2021-09-26 NOTE — ED Notes (Signed)
Patient compliant with night medication administration. Patient responds slowly to question. Patient slept all night. No acute distress noted. Will continue to monitor for safety.
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
## 2021-09-26 NOTE — ED Notes (Signed)
Patient was awake and given breakfast of muffin and orange juice. Denies SI and HI. Responds slowly when asked questions. He wants to see provider so he can leave. Will continue to monitor for safety.

## 2021-09-26 NOTE — ED Notes (Signed)
Patient resting quietly in bed with eyes closed, Respirations equal and unlabored, skin warm and dry, NAD. No change in assessment or acuity. Routine safety checks conducted according to facility protocol. Will continue to monitor for safety.      

## 2021-09-26 NOTE — Progress Notes (Signed)
Inpatient Behavioral Health Placement  Pt meets inpatient criteria per Princess Bruins, DO. There are no available beds at [facility] per [facility], RN.  Referral was sent to the following facilities;   Destination Service Provider Address Phone Fax  [facility]  [address], [place] [address] [phone]  [facility]  [address] [place] [address] [phone]  [facility]  [address] [place], [place] [address] [phone]  [facility]  [address] [place] [address] [phone]  [facility]  [address] [place], [place] [address] [phone]  [facility]  [address] [place] [address] [phone]  [facility]  [address], [place] [address] [phone]  [facility]  [address], [place] [address] [phone]  [facility]  [address] [place] [address] [phone]  [facility]  [address], [place] [address] [phone]  [facility]  [address] [place] [address] [phone]  [facility]  [address] [place] [address] [phone]  [facility]  [address] [place] [address] [phone]    Situation ongoing,  CSW will follow up.   Maryjean Ka, MSW, LCSWA 09/26/2021  @ 12:51 PM
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
## 2021-09-26 NOTE — Discharge Instructions (Addendum)
To see which pharmacy near you is the CHEAPEST for certain medications, please use GoodRx. It is free website and has a free phone app.      Also consider looking at [facility] $4.00 or Public's $7.00 prescription list. Both are free to view if googled "[facility] $4 prescription" and "public's $7 prescription". These are set prices, no insurance required.   For issues with sleep, please use this free app for insomnia called CBT-I. Let your doctors and therapists know so they can help with extra tips and tricks or for guidance and accountability. NO ADDS on the app.     For therapy outside the hospital, please ask for these specific types of therapy: CBT   Please make regular appointments with an outpatient psychiatrist and other doctors once you leave the hospital.    Suicide hotline: 988 Emergency: 911   Upon discharge from either the [facility] or an inpatient facility, it is imperative that you follow through with treatment recommendations 7-10 from the day of discharge to maintain gains in stabilization and mitigate further risk to your safety and overall well-being.  Based on your insurance coverage, a list of providers has been provided to get you started on finding the right provider best suited to your needs. In case of an urgent emergency, you have the option of contacting the Mobile Crisis Unit with [facility] at [phone].  You also have the option to dial 211 or go to www.nc211.org for additional community resources for legal aid, food, utility assistance, healthcare and other needs.  It is imperative that you follow through with treatment recommendations within 5-7 days from the day of discharge to mitigate further risk to your safety and overall mental well-being.  A list of outpatient therapy and psychiatric providers for medication management has been provided below to get you started in finding the right provider for you.            [place]  [facility] [address] [place], [place], [address] [phone] phone (Medicare, Private insurance except Tricare, [facility], and [facility])  [facility] [address] [place], [place], [address] [phone] phone (Call to confirm insurance coverage) Consultation & Support Services     o Drop-In Hours: 1:00 PM to 5:00 PM     o Days: Monday - Thursday  Crisis Services (24/7)   [facility] [address] [place], [place], [address] [phone] phone [email or web address]  (to complete the intake form and upload ID and insurance cards)  [facility] [address] [place], [place], [address] [phone] phone ([address], [place], [address] Complete Health, [employer], [place], [place], UHC, [place], and certain Medicaid plans)  [facility] [address], [place], [address] [phone] phone (Sliding scale, Medicaid, call about other insurance coverage)  [facility] [address] [place], [place], [address] [phone] phone [phone] fax (Medicaid, Medicare, Self-pay, call about other insurance coverage)  [facility] (age 39+) [address] [place], [place], [address] [phone] hone [phone] fax ([place], [address], [place], [place], [place], [address], [place], [place], [place], [place], certain [facility], [facility], UMR)  [facility] [address] [place], [place], [address] [phone] phone (Medicare, Medicaid, Artemio Aly, call about other insurance coverage)  [facility] [address] [place], [place], [address] [phone] phone [phone] fax (Call [phone] to see  what insurance is accepted) Archer Asa, MD specializes in  geropsych)  [facility], [facility]  (medication management only) [address] [place], [place], [address] [phone] phone [phone] fax ([address] Medicaid, [place], [place], [place], [place], [place], [place], [place])  [facility] (medication management only) [address] [place], [place], [address] [phone] phone [phone] fax ([address] Medicare, [place], [place], Tricare [place])  [facility] [address] [place], [place], [address] [phone] phone [phone] fax ([address], [place], [place], [place], [place], [facility], [place] Medicaid/[place] Health Choice)  [facility] [place] [employer]. [address] [place], [place], [address] [phone] phone [phone] fax (Medicare, Medicaid, [facility])  [facility] [address] [place], [place] [address] [phone] phone ([address] [place], [place], [place], [place], Medicare, [place], [facility]) Does genetic testing for medications; does transcranial magnetic stimulation along with basic services)  [facility] [address] [place], [place], [address] [phone] phone [phone] fax (Call about insurance coverage)  [facility] [phone] [address], [place], [address] [phone] phone (Medicaid, Tricare, [place], [place], [place])  [facility] [address] [place], [place], [address] [phone] phone [phone] fax (Medicaid, Medicare, Tricare, call about other insurance coverage)  [facility] [address] [place], [address] [phone] phone ([address], [place], [place], [place], [place] types - Alliance, Secretary/administrator, Partners, [place], [place] Health Choice, Healthy [place], [place], [place], and Complete)  [facility] [address] [place], [place], [address] [phone] phone Completely online treatment platform Contact: Personal assistant - [facility] Specialist [phone] phone [phone] fax ([address], [place], [place], Friday Health Plan, [place], [place], [place], [place], [place], [place] Optum)   Education officer, environmental at [facility] [address] [place], [place], [address] [phone] phone [phone] phone - call for referral and intake

## 2021-09-27 ENCOUNTER — Other Ambulatory Visit: Payer: Self-pay

## 2021-09-27 ENCOUNTER — Encounter (HOSPITAL_COMMUNITY): Payer: Self-pay | Admitting: Student

## 2021-09-27 DIAGNOSIS — F22 Delusional disorders: Secondary | ICD-10-CM | POA: Diagnosis present

## 2021-09-27 NOTE — Progress Notes (Signed)
Pt was accept to [facility] 09/28/21; Deatra Canter building unit C   Pt meets inpatient criteria per Princess Bruins, DO  Attending Physician will be Dr. Forrestine Him   Report can be called to: [phone]   Pt can arrive after 8:00am  Care Team notified: Princess Bruins, DO, Joslyn Devon, RN Jake Samples, LCSW.  Kelton Pillar, LCSWA 09/27/2021 @ 2:32 PM
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
## 2021-09-27 NOTE — BH Assessment (Signed)
LCSW Progress Note  LCSW provided a list of providers for outpatient therapy and psychiatric services in pt's AVS at the request of Dr. Princess Bruins, in the case that he should not be accepted into an inpatient facility.  Patient was faxed out by Staci Acosta, LCSW on 26 September 2021.  As of 1035, she is still waiting to here back from [facility] for placement.   Hansel Starling, MSW, LCSW [facility] [phone] phone
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
## 2021-09-27 NOTE — Progress Notes (Addendum)
CSW spoke with [facility] at length. Pt has been accepted PENDING verification of pt's insurance. CSW is awaiting on [facility] Intake, Kia to follow up.  Maryjean Ka, MSW, LCSWA 09/27/2021 1:03 PM

## 2021-09-27 NOTE — ED Notes (Signed)
Extra meal given

## 2021-09-27 NOTE — Progress Notes (Signed)
Patient is alert X 3, denies SI, HI and AVH. Patient is calm and pleasant on the unit, taking scheduled medications. Patient does not understand why inpatient is recommended at this time when he has not exhibit any behaviors since coming onto unit. Patient states, "Why are they trying to send me to a hospital, I would rather work with my own hands instead of receiving a check for being crazy. There is nothing wrong with me." RN offered emotional support by active listening. Nursing staff will continue to monitor patient while on unit.

## 2021-09-27 NOTE — Progress Notes (Signed)
CSW called and spoke with [facility] who advised that they are reviewing the updated notes and will call this CSW back. CSW will assist and follow with placement.  Maryjean Ka, MSW, [facility] 09/27/2021 9:49 AM

## 2021-09-27 NOTE — Progress Notes (Signed)
Inpatient Behavioral Health Placement  CSW resent referral to [facility] as requested by intake who advised that pt could possibly admit after their discharges. CSW will assist and follow up.    Pt meets inpatient criteria per Princess Bruins, DO.  RN.  Referral was sent to the following facilities;    Destination Service Provider Address Phone Fax  [facility]  [address] [place] [address] [phone]  [facility]  [address] [place] [address] [phone]  [facility]  [address] [place], [place] [address] [phone]  [facility]  [address] [place] [address] [phone]  [facility]  [address] [place], [place] [address] [phone]  [facility]  [address] [place] [address] [phone]  [facility]  [address], [place] [address] [phone]  [facility]  [address], [place] [address] [phone]  [facility]  [address], [place] [address] [phone]  [facility]  [address] [place] [address] [phone]  [facility]  [address] [place] [address] [phone]  [facility]  [address] [place] [address] [phone]  [facility]  [address] [place] [address] [phone]      Situation ongoing,  CSW will follow up.

## 2021-09-27 NOTE — ED Notes (Signed)
Meal given
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
## 2021-09-27 NOTE — ED Notes (Signed)
Patient compliant with medications. Patient resting quietly in bed with eyes closed, Respirations equal and unlabored, skin warm and dry, NAD. Routine safety checks conducted according to facility protocol. Will continue to monitor for safety.

## 2021-09-28 DIAGNOSIS — F1290 Cannabis use, unspecified, uncomplicated: Secondary | ICD-10-CM

## 2021-09-28 DIAGNOSIS — F209 Schizophrenia, unspecified: Secondary | ICD-10-CM

## 2021-09-28 DIAGNOSIS — L989 Disorder of the skin and subcutaneous tissue, unspecified: Secondary | ICD-10-CM

## 2021-09-28 DIAGNOSIS — F22 Delusional disorders: Secondary | ICD-10-CM | POA: Diagnosis not present

## 2021-10-18 ENCOUNTER — Ambulatory Visit: Payer: Federal, State, Local not specified - PPO | Admitting: Physician Assistant

## 2022-06-21 ENCOUNTER — Encounter (HOSPITAL_COMMUNITY): Payer: Self-pay | Admitting: Emergency Medicine

## 2022-06-21 ENCOUNTER — Emergency Department (HOSPITAL_COMMUNITY)
Admission: EM | Admit: 2022-06-21 | Discharge: 2022-06-21 | Disposition: A | Discharge status: Home / Self Care | Payer: Federal, State, Local not specified - PPO | Attending: Emergency Medicine | Admitting: Emergency Medicine

## 2022-06-21 DIAGNOSIS — H5789 Other specified disorders of eye and adnexa: Secondary | ICD-10-CM | POA: Insufficient documentation

## 2022-06-21 MED ORDER — TETRACAINE HCL 0.5 % OP SOLN
2.0000 [drp] | Freq: Once | OPHTHALMIC | Status: AC
  Administered 2022-06-21: 2 [drp] via OPHTHALMIC
  Filled 2022-06-21: qty 4

## 2022-06-21 MED ORDER — FLUORESCEIN SODIUM 1 MG OP STRP
1.0000 | ORAL_STRIP | Freq: Once | OPHTHALMIC | Status: AC
  Administered 2022-06-21: 1 via OPHTHALMIC
  Filled 2022-06-21: qty 1

## 2022-06-21 NOTE — ED Provider Notes (Signed)
[facility] Provider Note   CSN: [id number] Arrival date & time: 06/21/22  1907     History  Chief Complaint  Patient presents with   Eye Pain    Colten Rikuto Vonbergen is a 22 y.o. male.  HPI   22 year old male presents emergency department with right-sided eye irritation.  Patient states yesterday he felt like a bug flew into his eye and since then he has been had itchiness.  He denies any vision loss.  No pain with eye movement.  No face swelling/redness.  Home Medications Prior to Admission medications   Medication Sig Start Date End Date Taking? Authorizing Provider  risperiDONE (RISPERDAL M-TABS) 2 MG disintegrating tablet Dissolve 1 tablet (2 mg total) by mouth 2 (two) times daily. 08/14/21 09/26/21  Marissa Calamity, NP      Allergies    Penicillins    Review of Systems   Review of Systems  Constitutional:  Negative for fever.  HENT:  Negative for facial swelling.   Eyes:  Positive for redness. Negative for photophobia, pain, discharge, itching and visual disturbance.  Neurological:  Negative for headaches.    Physical Exam Updated Vital Signs BP (!) 141/106 (BP Location: Right Arm)   Pulse 91   Temp 98.3 F (36.8 C) (Oral)   Resp 18   SpO2 100%  Physical Exam Constitutional:      General: He is not in acute distress. HENT:     Head: Normocephalic and atraumatic.     Right Ear: External ear normal.     Left Ear: External ear normal.     Nose: Nose normal.  Eyes:     Extraocular Movements: Extraocular movements intact.     Pupils: Pupils are equal, round, and reactive to light.     Comments: Very mild injected sclera at the most temporal region of the right eye.  No eyelid or periorbital edema/redness.  Vision intact, pupils normal.  Cardiovascular:     Rate and Rhythm: Normal rate.  Pulmonary:     Effort: Pulmonary effort is normal.  Musculoskeletal:        General: No deformity.  Neurological:     Mental  Status: He is alert.     ED Results / Procedures / Treatments   Labs (all labs ordered are listed, but only abnormal results are displayed) Labs Reviewed - No data to display  EKG None  Radiology No results found.  Procedures Procedures    Medications Ordered in ED Medications  fluorescein ophthalmic strip 1 strip (1 strip Right Eye Given 06/21/22 2332)  tetracaine (PONTOCAINE) 0.5 % ophthalmic solution 2 drop (2 drops Right Eye Given 06/21/22 2332)    ED Course/ Medical Decision Making/ A&P                             Medical Decision Making Risk Prescription drug management.   22 year old male presents emergency department with irritation of the right eye, felt like a bug flew into yesterday and since then has had mild irritation.  No other visual symptoms/deficits.  Vitals are stable on arrival.  Eye exam shows mild injection of the temporal corner of the eye.  No other significant facial swelling/findings of infection.  Fluorescein stain shows no corneal abrasion.  No foreign body noted.  Patient at this time appears safe and stable for discharge and close outpatient follow up. Discharge plan and strict return to ED  precautions discussed, patient verbalizes understanding and agreement.        Final Clinical Impression(s) / ED Diagnoses Final diagnoses:  Eye irritation    Rx / DC Orders ED Discharge Orders     None         Lorelle Gibbs, DO 06/21/22 2335

## 2022-06-21 NOTE — ED Triage Notes (Signed)
Pt reports something fly in eye and it feels like he cannot get it out. Occurred yesterday. R eye is reddened. Denies vision issues.

## 2022-06-21 NOTE — ED Notes (Signed)
Registration told this NT and RN Caryl Pina that the patient was making no sense and talking all kinds of "crazy".  Triage RN notified.

## 2022-06-21 NOTE — Discharge Instructions (Signed)
You have been seen and discharged from the emergency department.  Your eye was evaluated for abrasion/foreign body.  Eyedrops were used to flush out the eye along with other medicine.  Follow-up with your primary provider for further evaluation and further care. Take home medications as prescribed. If you have any worsening symptoms or further concerns for your health please return to an emergency department for further evaluation.
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
## 2023-05-17 ENCOUNTER — Other Ambulatory Visit: Payer: Self-pay

## 2023-11-05 ENCOUNTER — Encounter (HOSPITAL_COMMUNITY): Payer: Self-pay | Admitting: Psychiatry

## 2023-11-05 ENCOUNTER — Emergency Department (HOSPITAL_COMMUNITY)

## 2023-11-05 ENCOUNTER — Emergency Department (EMERGENCY_DEPARTMENT_HOSPITAL)
Admission: EM | Admit: 2023-11-05 | Discharge: 2023-11-06 | Disposition: A | Discharge status: Other Acute Inpatient Hospital | Source: Home / Self Care | Attending: Emergency Medicine | Admitting: Emergency Medicine

## 2023-11-05 DIAGNOSIS — F209 Schizophrenia, unspecified: Secondary | ICD-10-CM | POA: Diagnosis not present

## 2023-11-05 DIAGNOSIS — F29 Unspecified psychosis not due to a substance or known physiological condition: Secondary | ICD-10-CM | POA: Insufficient documentation

## 2023-11-05 DIAGNOSIS — F200 Paranoid schizophrenia: Secondary | ICD-10-CM | POA: Insufficient documentation

## 2023-11-05 DIAGNOSIS — F1210 Cannabis abuse, uncomplicated: Secondary | ICD-10-CM | POA: Diagnosis not present

## 2023-11-05 DIAGNOSIS — Z8659 Personal history of other mental and behavioral disorders: Secondary | ICD-10-CM | POA: Diagnosis not present

## 2023-11-05 DIAGNOSIS — F1290 Cannabis use, unspecified, uncomplicated: Secondary | ICD-10-CM | POA: Insufficient documentation

## 2023-11-05 LAB — CBC WITH DIFFERENTIAL/PLATELET
Abs Immature Granulocytes: 0.04 K/uL (ref 0.00–0.07)
Basophils Absolute: 0.1 K/uL (ref 0.0–0.1)
Basophils Relative: 1 %
Eosinophils Absolute: 0.2 K/uL (ref 0.0–0.5)
Eosinophils Relative: 2 %
HCT: 35.9 % — ABNORMAL LOW (ref 39.0–52.0)
Hemoglobin: 11 g/dL — ABNORMAL LOW (ref 13.0–17.0)
Immature Granulocytes: 0 %
Lymphocytes Relative: 30 %
Lymphs Abs: 3.1 K/uL (ref 0.7–4.0)
MCH: 23.2 pg — ABNORMAL LOW (ref 26.0–34.0)
MCHC: 30.6 g/dL (ref 30.0–36.0)
MCV: 75.6 fL — ABNORMAL LOW (ref 80.0–100.0)
Monocytes Absolute: 0.6 K/uL (ref 0.1–1.0)
Monocytes Relative: 6 %
Neutro Abs: 6.3 K/uL (ref 1.7–7.7)
Neutrophils Relative %: 61 %
Platelets: 251 K/uL (ref 150–400)
RBC: 4.75 MIL/uL (ref 4.22–5.81)
RDW: 12.2 % (ref 11.5–15.5)
WBC: 10.4 K/uL (ref 4.0–10.5)
nRBC: 0 % (ref 0.0–0.2)

## 2023-11-05 LAB — COMPREHENSIVE METABOLIC PANEL WITH GFR
ALT: 26 U/L (ref 0–44)
AST: 39 U/L (ref 15–41)
Albumin: 3.8 g/dL (ref 3.5–5.0)
Alkaline Phosphatase: 61 U/L (ref 38–126)
Anion gap: 10 (ref 5–15)
BUN: 18 mg/dL (ref 6–20)
CO2: 24 mmol/L (ref 22–32)
Calcium: 9.3 mg/dL (ref 8.9–10.3)
Chloride: 104 mmol/L (ref 98–111)
Creatinine, Ser: 0.77 mg/dL (ref 0.61–1.24)
GFR, Estimated: >60 mL/min (ref >60)
Glucose, Bld: 88 mg/dL (ref 70–99)
Potassium: 3.5 mmol/L (ref 3.5–5.1)
Sodium: 138 mmol/L (ref 135–145)
Total Bilirubin: 0.8 mg/dL (ref 0.0–1.2)
Total Protein: 6.2 g/dL — ABNORMAL LOW (ref 6.5–8.1)

## 2023-11-05 LAB — SALICYLATE LEVEL: Salicylate Lvl: <7 mg/dL — ABNORMAL LOW (ref 7.0–30.0)

## 2023-11-05 LAB — ETHANOL: Alcohol, Ethyl (B): <15 mg/dL (ref <15)

## 2023-11-05 LAB — ACETAMINOPHEN LEVEL: Acetaminophen (Tylenol), Serum: <10 ug/mL — ABNORMAL LOW (ref 10–30)

## 2023-11-05 MED ORDER — IBUPROFEN 800 MG PO TABS: 800.0000 mg | ORAL_TABLET | Freq: Once | ORAL | Status: DC

## 2023-11-05 MED ORDER — DROPERIDOL 2.5 MG/ML IJ SOLN: 5.0000 mg | Freq: Once | INTRAMUSCULAR | Status: DC

## 2023-11-05 MED ORDER — OLANZAPINE 5 MG PO TBDP: 5.0000 mg | ORAL_TABLET | Freq: Every day | ORAL | Status: DC

## 2023-11-05 NOTE — ED Notes (Signed)
 IVC PAPER WORK WAS REJECTED BY MAGISTRATE NEED MORE DETAILS INFORMED PA BY CHAT

## 2023-11-05 NOTE — ED Notes (Signed)
 IVC PAPERWORK ACCEPT AFTER BEING REJECTED  TWICE NOW IT IS ACCEPTED WAITING FOR OFFICER TO BRING FINDING AND CUSTODY
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
## 2023-11-05 NOTE — ED Notes (Signed)
 Patient stated to EMS that he does not want to be IVC and that he only has a health concern.

## 2023-11-05 NOTE — ED Notes (Signed)
 Attempted to obtain updated vitals on Pt. Pt stated he does not want it. Rn aware

## 2023-11-05 NOTE — ED Triage Notes (Signed)
 Patient BIB [facility] from street after he called for foot pain. Patient has multiple blisters to his left foot. Patient also not answering questions appropriately within reasonable time frame but does have hx of schizophrenia.  BP 118/82 HR 72 RR 18 98% RA

## 2023-11-05 NOTE — ED Notes (Signed)
 IVC CASE #[id number]

## 2023-11-05 NOTE — ED Notes (Signed)
IVC PAPERWORK IN PROCESS 

## 2023-11-05 NOTE — ED Provider Notes (Signed)
 [place] EMERGENCY DEPARTMENT AT [facility] Provider Note   CSN: [id number] Arrival date & time: 11/05/23  8283     Patient presents with: Foot Pain and Mental Health Problem   Garrett Bolton is a 23 y.o. male with a past medical history of schizophrenia who presents to the emergency department with complaint of bilateral foot pain.  Patient is a difficult historian at this time given his schizophrenia but he is able to tell me that he has been walking a lot and thinks that is why his feet hurt.  He allows me to examine only 1 slight, his right foot.  I am unable to get much history elsewise.    Foot Pain  Mental Health Problem      Prior to Admission medications   Medication Sig Start Date End Date Taking? Authorizing Provider  risperiDONE  (RISPERDAL  M-TABS) 2 MG disintegrating tablet Dissolve 1 tablet (2 mg total) by mouth 2 (two) times daily. 08/14/21 09/26/21  White, Wyline CROME, NP    Allergies: Penicillins    Review of Systems  Updated Vital Signs BP 120/88   Pulse 79   Temp 98.5 F (36.9 C) (Oral)   Resp 17   SpO2 99%   Physical Exam Vitals and nursing note reviewed.  Constitutional:      General: He is not in acute distress.    Appearance: He is well-developed. He is not diaphoretic.  HENT:     Head: Normocephalic and atraumatic.  Eyes:     General: No scleral icterus.    Conjunctiva/sclera: Conjunctivae normal.  Cardiovascular:     Rate and Rhythm: Normal rate and regular rhythm.     Heart sounds: Normal heart sounds.  Pulmonary:     Effort: Pulmonary effort is normal. No respiratory distress.     Breath sounds: Normal breath sounds.  Abdominal:     Palpations: Abdomen is soft.     Tenderness: There is no abdominal tenderness.  Musculoskeletal:     Cervical back: Normal range of motion and neck supple.  Skin:    General: Skin is warm and dry.  Neurological:     Mental Status: He is alert.     (all labs ordered are listed, but  only abnormal results are displayed) Labs Reviewed  COMPREHENSIVE METABOLIC PANEL WITH GFR  ETHANOL  RAPID URINE DRUG SCREEN, HOSP PERFORMED  CBC WITH DIFFERENTIAL/PLATELET  SALICYLATE LEVEL  ACETAMINOPHEN  LEVEL    EKG: EKG Interpretation Date/Time:  Monday November 05 2023 18:21:40 EDT Ventricular Rate:  62 PR Interval:  152 QRS Duration:  86 QT Interval:  386 QTC Calculation: 391 R Axis:   91  Text Interpretation: Normal sinus rhythm Rightward axis Nonspecific ST abnormality Abnormal ECG When compared with ECG of 14-Aug-2021 08:06, PREVIOUS ECG IS PRESENT Confirmed by Cottie Cough [phone]) on 11/05/2023 6:28:38 PM  Radiology: [facility] Foot Complete Left Result Date: 11/05/2023 CLINICAL DATA:  Left foot pain EXAM: LEFT FOOT - COMPLETE 3+ VIEW COMPARISON:  None FINDINGS: Frontal, oblique, and lateral views of the left foot are obtained. No acute fracture, subluxation, or dislocation. Joint spaces are well preserved. Soft tissues are unremarkable. IMPRESSION: 1. Unremarkable left foot. Electronically Signed   By: Ozell Daring M.D.   On: 11/05/2023 18:20     Procedures   Medications Ordered in the ED - No data to display  Clinical Course as of 11/05/23 2005  Mon Nov 05, 2023  1910 23 yo male presenting by EMS with concern for erratic  behavior.  Pt complained primarily of blisters on his foot, but is not directable in conversation.  History of schizophrenia. He reports he's homeless.  PA provider not able to contact mother listed as legal guardian.  Pt demonstrating pressured speech, flight of idea, refusal to cooperate with medical workup.  IVC filed given concerns for psychosis.  May require a dose of sedatives to ensure patient and staff safety for bloodwork [MT]    Clinical Course User Index [MT] Trifan, Donnice PARAS, MD                                 Medical Decision Making Amount and/or Complexity of Data Reviewed Labs: ordered. Radiology: ordered.  Risk Decision regarding  hospitalization.   Patient here with disorganized schizophrenia.  He has a blister on his foot but no signs of secondary infection.  Patient will need inpatient stabilization for his schizophrenia.     Final diagnoses:  None    ED Discharge Orders     None          Arloa Chroman, PA-C 11/09/23 2302    Gennaro Bouchard L, DO 11/10/23 1622

## 2023-11-05 NOTE — ED Notes (Signed)
 Pt refused labs.

## 2023-11-05 NOTE — ED Provider Triage Note (Signed)
 Emergency Medicine Provider Triage Evaluation Note  Garrett Bolton , a 23 y.o. male  was evaluated in triage.  Pt complains of left foot pain and blistering.  Review of Systems  Positive: Left foot pain Negative: fever  Physical Exam  BP 120/88   Pulse 79   Temp 98.5 F (36.9 C) (Oral)   Resp 17   SpO2 99%  Gen:   Awake, no distress   Resp:  Normal effort  MSK:   Moves extremities without difficulty  Other:  Left foot is difficult to examine in triage due to being covered into space.  Dorsal pedal pulses intact.  Medical Decision Making  Medically screening exam initiated at 5:46 PM.  Appropriate orders placed.  Garrett Bolton was informed that the remainder of the evaluation will be completed by another provider, this initial triage assessment does not replace that evaluation, and the importance of remaining in the ED until their evaluation is complete.  Denies SI, HI.  Patient is very slow to answer questions and mumbling/confused.  Does have history of schizophrenia.  Reports he not been taking his medicine.   Garrett Warren SAILOR, PA-C 11/05/23 1747

## 2023-11-05 NOTE — BH Assessment (Signed)
 Patient was deferred to [facility] for a telepsych assessment. The assigned care coordinator will provide updates regarding the scheduling of the assessment. [facility] coordinator can be reached at [phone] for further information on the timing of the telepsych evaluation.

## 2023-11-05 NOTE — ED Notes (Signed)
 Patient feet cleaned as best we are able, patient refused to allow feet to be cleaned. Patient also placed socks over toothpaste on feet.

## 2023-11-06 ENCOUNTER — Encounter (HOSPITAL_COMMUNITY): Payer: Self-pay | Admitting: Psychiatry

## 2023-11-06 ENCOUNTER — Inpatient Hospital Stay (HOSPITAL_COMMUNITY)
Admission: AD | Admit: 2023-11-06 | Discharge: 2023-11-16 | DRG: 885 | Disposition: A | Discharge status: Home / Self Care | Source: Intra-hospital | Attending: Student in an Organized Health Care Education/Training Program | Admitting: Student in an Organized Health Care Education/Training Program

## 2023-11-06 ENCOUNTER — Other Ambulatory Visit: Payer: Self-pay

## 2023-11-06 DIAGNOSIS — F600 Paranoid personality disorder: Secondary | ICD-10-CM | POA: Diagnosis present

## 2023-11-06 DIAGNOSIS — F1290 Cannabis use, unspecified, uncomplicated: Secondary | ICD-10-CM | POA: Diagnosis not present

## 2023-11-06 DIAGNOSIS — F200 Paranoid schizophrenia: Principal | ICD-10-CM | POA: Diagnosis present

## 2023-11-06 DIAGNOSIS — Z716 Tobacco abuse counseling: Secondary | ICD-10-CM | POA: Diagnosis not present

## 2023-11-06 DIAGNOSIS — Z5982 Transportation insecurity: Secondary | ICD-10-CM

## 2023-11-06 DIAGNOSIS — F1210 Cannabis abuse, uncomplicated: Secondary | ICD-10-CM | POA: Diagnosis present

## 2023-11-06 DIAGNOSIS — F209 Schizophrenia, unspecified: Secondary | ICD-10-CM

## 2023-11-06 DIAGNOSIS — F201 Disorganized schizophrenia: Secondary | ICD-10-CM | POA: Diagnosis not present

## 2023-11-06 DIAGNOSIS — F17210 Nicotine dependence, cigarettes, uncomplicated: Secondary | ICD-10-CM | POA: Diagnosis present

## 2023-11-06 DIAGNOSIS — Z5900 Homelessness unspecified: Secondary | ICD-10-CM

## 2023-11-06 DIAGNOSIS — D573 Sickle-cell trait: Secondary | ICD-10-CM | POA: Diagnosis present

## 2023-11-06 DIAGNOSIS — Z91148 Patient's other noncompliance with medication regimen for other reason: Secondary | ICD-10-CM

## 2023-11-06 DIAGNOSIS — Z560 Unemployment, unspecified: Secondary | ICD-10-CM

## 2023-11-06 DIAGNOSIS — Z5941 Food insecurity: Secondary | ICD-10-CM

## 2023-11-06 DIAGNOSIS — Z818 Family history of other mental and behavioral disorders: Secondary | ICD-10-CM | POA: Diagnosis not present

## 2023-11-06 LAB — RAPID URINE DRUG SCREEN, HOSP PERFORMED
Amphetamines: NOT DETECTED
Barbiturates: NOT DETECTED
Benzodiazepines: NOT DETECTED
Cocaine: NOT DETECTED
Opiates: NOT DETECTED
Tetrahydrocannabinol: POSITIVE — AB

## 2023-11-06 MED ORDER — DIPHENHYDRAMINE HCL 50 MG/ML IJ SOLN: 50.0000 mg | Freq: Three times a day (TID) | INTRAMUSCULAR | Status: DC | PRN

## 2023-11-06 MED ORDER — DIPHENHYDRAMINE HCL 25 MG PO CAPS
50.0000 mg | ORAL_CAPSULE | Freq: Four times a day (QID) | ORAL | Status: DC | PRN
Start: 2023-11-06 — End: 2023-11-06

## 2023-11-06 MED ORDER — HALOPERIDOL 5 MG PO TABS: 5.0000 mg | ORAL_TABLET | Freq: Three times a day (TID) | ORAL | Status: DC | PRN

## 2023-11-06 MED ORDER — OLANZAPINE 5 MG PO TBDP
5.0000 mg | ORAL_TABLET | Freq: Two times a day (BID) | ORAL | Status: DC
  Administered 2023-11-06 – 2023-11-07 (×2): 5 mg via ORAL
  Filled 2023-11-06 (×2): qty 1

## 2023-11-06 MED ORDER — TRAZODONE HCL 50 MG PO TABS
50.0000 mg | ORAL_TABLET | Freq: Every evening | ORAL | Status: DC | PRN
  Administered 2023-11-06 – 2023-11-11 (×2): 50 mg via ORAL
  Filled 2023-11-06 (×3): qty 1

## 2023-11-06 MED ORDER — LORAZEPAM 1 MG PO TABS: 1.0000 mg | ORAL_TABLET | Freq: Four times a day (QID) | ORAL | Status: DC | PRN

## 2023-11-06 MED ORDER — LORAZEPAM 2 MG/ML IJ SOLN: 2.0000 mg | Freq: Three times a day (TID) | INTRAMUSCULAR | Status: DC | PRN

## 2023-11-06 MED ORDER — OLANZAPINE 5 MG PO TBDP: 5.0000 mg | ORAL_TABLET | Freq: Two times a day (BID) | ORAL | Status: DC

## 2023-11-06 MED ORDER — HALOPERIDOL LACTATE 5 MG/ML IJ SOLN: 10.0000 mg | Freq: Three times a day (TID) | INTRAMUSCULAR | Status: DC | PRN

## 2023-11-06 MED ORDER — ACETAMINOPHEN 325 MG PO TABS: 650.0000 mg | ORAL_TABLET | Freq: Four times a day (QID) | ORAL | Status: DC | PRN

## 2023-11-06 MED ORDER — DIPHENHYDRAMINE HCL 25 MG PO CAPS: 50.0000 mg | ORAL_CAPSULE | Freq: Three times a day (TID) | ORAL | Status: DC | PRN

## 2023-11-06 MED ORDER — OLANZAPINE 5 MG PO TBDP: 5.0000 mg | ORAL_TABLET | Freq: Three times a day (TID) | ORAL | Status: DC | PRN

## 2023-11-06 MED ORDER — HYDROXYZINE HCL 25 MG PO TABS
25.0000 mg | ORAL_TABLET | Freq: Three times a day (TID) | ORAL | Status: DC | PRN
  Administered 2023-11-06 – 2023-11-11 (×4): 25 mg via ORAL
  Filled 2023-11-06 (×5): qty 1

## 2023-11-06 MED ORDER — MAGNESIUM HYDROXIDE 400 MG/5ML PO SUSP: 30.0000 mL | Freq: Every day | ORAL | Status: DC | PRN

## 2023-11-06 MED ORDER — OLANZAPINE 5 MG PO TBDP
5.0000 mg | ORAL_TABLET | Freq: Two times a day (BID) | ORAL | Status: DC
  Administered 2023-11-06: 5 mg via ORAL
  Filled 2023-11-06: qty 1

## 2023-11-06 MED ORDER — ENSURE PLUS HIGH PROTEIN PO LIQD
237.0000 mL | Freq: Two times a day (BID) | ORAL | Status: DC
  Administered 2023-11-06 – 2023-11-13 (×7): 237 mL via ORAL
  Filled 2023-11-06 (×22): qty 237

## 2023-11-06 MED ORDER — OLANZAPINE 5 MG PO TBDP: 5.0000 mg | ORAL_TABLET | Freq: Two times a day (BID) | ORAL | Status: DC | PRN

## 2023-11-06 MED ORDER — HALOPERIDOL LACTATE 5 MG/ML IJ SOLN: 5.0000 mg | Freq: Three times a day (TID) | INTRAMUSCULAR | Status: DC | PRN

## 2023-11-06 MED ORDER — ALUM & MAG HYDROXIDE-SIMETH 200-200-20 MG/5ML PO SUSP: 30.0000 mL | ORAL | Status: DC | PRN

## 2023-11-06 NOTE — Plan of Care (Signed)
   Problem: Education: Goal: Knowledge of [place] [facility] Education information/materials will improve Outcome: Progressing Goal: Emotional status will improve Outcome: Progressing Goal: Mental status will improve Outcome: Progressing
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
## 2023-11-06 NOTE — ED Provider Notes (Signed)
 Emergency Medicine Observation Re-evaluation Note  Garrett Bolton is a 23 y.o. male, seen on rounds today.  Pt initially presented to the ED for complaints of Foot Pain and Mental Health Problem Currently, the patient is resting comfortably.  Physical Exam  BP 120/88   Pulse 79   Temp 98.5 F (36.9 C) (Oral)   Resp 17   SpO2 99%  Physical Exam General: nad Cardiac: good peripheral perfusion Lungs: bilateral chest rise no tachypnea Psych: resting comfortably  ED Course / MDM  EKG:EKG Interpretation Date/Time:  Monday November 05 2023 18:21:40 EDT Ventricular Rate:  62 PR Interval:  152 QRS Duration:  86 QT Interval:  386 QTC Calculation: 391 R Axis:   91  Text Interpretation: Normal sinus rhythm Rightward axis Nonspecific ST abnormality Abnormal ECG When compared with ECG of 14-Aug-2021 08:06, PREVIOUS ECG IS PRESENT Confirmed by Cottie Cough [phone]) on 11/05/2023 6:28:38 PM  I have reviewed the labs performed to date as well as medications administered while in observation.  Recent changes in the last 24 hours include seen for foot pain found to be psychotic.  Evalulated by TTS recommends in patient.  Plan  Current plan is for psych dispo.    Emil Share, DO 11/06/23 [phone]

## 2023-11-06 NOTE — Plan of Care (Signed)

## 2023-11-06 NOTE — Tx Team (Signed)
 Initial Treatment Plan 11/06/2023 5:25 PM Heinz Eckert FMW:[id number]    PATIENT STRESSORS: Financial difficulties   Medication change or noncompliance   Substance abuse     PATIENT STRENGTHS: Physical Health  Supportive family/friends    PATIENT IDENTIFIED PROBLEMS: Psychosis   Paranoia   Cannabis use                  DISCHARGE CRITERIA:  Improved stabilization in mood, thinking, and/or behavior Need for constant or close observation no longer present  PRELIMINARY DISCHARGE PLAN: Return to previous living arrangement  PATIENT/FAMILY INVOLVEMENT: This treatment plan has been presented to and reviewed with the patient, Garrett Bolton.  The patient has been given the opportunity to ask questions and make suggestions.  Powell JAYSON Sharps, RN 11/06/2023, 5:25 PM
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
## 2023-11-06 NOTE — ED Notes (Signed)
 TTS in process

## 2023-11-06 NOTE — ED Notes (Signed)
 IVC'd 11/05/23, exp 11/12/23

## 2023-11-06 NOTE — Progress Notes (Signed)
 Pt has been accepted to [facility] on 11/06/2023 Bed assignment: 501-02  Pt meets inpatient criteria per: Elveria Batter NP  Attending Physician will be: Pasyhan MD  Report can be called to: Adult unit: [phone]  Pt can arrive after: [facility] WILL UPDATE  Care Team Notified: [facility] Cherylynn Ernst RN, Elveria Batter NP  [place] Sircharles Holzheimer LCSW-A   11/06/2023 11:42 AM

## 2023-11-06 NOTE — ED Notes (Signed)
 Patient Blood Pressure 0942 was 96/62 and now at 1348 it is 98/61. Dr Emil was made aware of blood pressure. Per Dr Emil patient can go to [facility].

## 2023-11-07 ENCOUNTER — Encounter (HOSPITAL_COMMUNITY): Payer: Self-pay

## 2023-11-07 DIAGNOSIS — F1290 Cannabis use, unspecified, uncomplicated: Secondary | ICD-10-CM

## 2023-11-07 DIAGNOSIS — F209 Schizophrenia, unspecified: Secondary | ICD-10-CM

## 2023-11-07 MED ORDER — OLANZAPINE 10 MG IM SOLR: 10.0000 mg | Freq: Three times a day (TID) | INTRAMUSCULAR | Status: DC | PRN

## 2023-11-07 MED ORDER — RISPERIDONE 1 MG PO TBDP
1.0000 mg | ORAL_TABLET | Freq: Two times a day (BID) | ORAL | Status: DC
  Administered 2023-11-07 – 2023-11-08 (×3): 1 mg via ORAL
  Filled 2023-11-07 (×5): qty 1

## 2023-11-07 MED ORDER — OLANZAPINE 5 MG PO TBDP: 5.0000 mg | ORAL_TABLET | Freq: Three times a day (TID) | ORAL | Status: DC | PRN

## 2023-11-07 MED ORDER — OLANZAPINE 10 MG IM SOLR: 5.0000 mg | Freq: Three times a day (TID) | INTRAMUSCULAR | Status: DC | PRN

## 2023-11-07 NOTE — BH IP Treatment Plan (Signed)
 Interdisciplinary Treatment and Diagnostic Plan Update  11/07/2023 Time of Session: 10:35 AM Garrett Bolton MRN: [id number]  Principal Diagnosis: Schizophrenia [facility])  Secondary Diagnoses: Principal Problem:   Schizophrenia (HCC) Active Problems:   Episodic cannabis use   Current Medications:  Current Facility-Administered Medications  Medication Dose Route Frequency Provider Last Rate Last Admin   acetaminophen  (TYLENOL ) tablet 650 mg  650 mg Oral Q6H PRN Coleman, Carolyn H, NP       alum & mag hydroxide-simeth (MAALOX/MYLANTA) 200-200-20 MG/5ML suspension 30 mL  30 mL Oral Q4H PRN Coleman, Carolyn H, NP       feeding supplement (ENSURE PLUS HIGH PROTEIN) liquid 237 mL  237 mL Oral BID BM Towana Leita SAILOR, MD   237 mL at 11/07/23 1406   hydrOXYzine  (ATARAX ) tablet 25 mg  25 mg Oral TID PRN Coleman, Carolyn H, NP   25 mg at 11/06/23 2047   magnesium  hydroxide (MILK OF MAGNESIA) suspension 30 mL  30 mL Oral Daily PRN Coleman, Carolyn H, NP       OLANZapine  (ZYPREXA ) injection 10 mg  10 mg Intramuscular TID PRN Lenard Calin, MD       OLANZapine  (ZYPREXA ) injection 5 mg  5 mg Intramuscular TID PRN Lenard Calin, MD       OLANZapine  zydis (ZYPREXA ) disintegrating tablet 5 mg  5 mg Oral TID PRN Lenard Calin, MD       risperiDONE  (RISPERDAL  M-TABS) disintegrating tablet 1 mg  1 mg Oral BID Lenard Calin, MD   1 mg at 11/07/23 1652   traZODone  (DESYREL ) tablet 50 mg  50 mg Oral QHS PRN Coleman, Carolyn H, NP   50 mg at 11/06/23 2047   PTA Medications: No medications prior to admission.    Patient Stressors: Financial difficulties   Medication change or noncompliance   Substance abuse    Patient Strengths: Physical Health  Supportive family/friends   Treatment Modalities: Medication Management, Group therapy, Case management,  1 to 1 session with clinician, Psychoeducation, Recreational therapy.   Physician Treatment Plan for Primary Diagnosis: Schizophrenia  (HCC) Long Term Goal(s): Improvement in symptoms so as ready for discharge   Short Term Goals: Ability to identify changes in lifestyle to reduce recurrence of condition will improve Ability to maintain clinical measurements within normal limits will improve Compliance with prescribed medications will improve Ability to identify triggers associated with substance abuse/mental health issues will improve  Medication Management: Evaluate patient's response, side effects, and tolerance of medication regimen.  Therapeutic Interventions: 1 to 1 sessions, Unit Group sessions and Medication administration.  Evaluation of Outcomes: Not Progressing  Physician Treatment Plan for Secondary Diagnosis: Principal Problem:   Schizophrenia (HCC) Active Problems:   Episodic cannabis use  Long Term Goal(s): Improvement in symptoms so as ready for discharge   Short Term Goals: Ability to identify changes in lifestyle to reduce recurrence of condition will improve Ability to maintain clinical measurements within normal limits will improve Compliance with prescribed medications will improve Ability to identify triggers associated with substance abuse/mental health issues will improve     Medication Management: Evaluate patient's response, side effects, and tolerance of medication regimen.  Therapeutic Interventions: 1 to 1 sessions, Unit Group sessions and Medication administration.  Evaluation of Outcomes: Not Progressing   RN Treatment Plan for Primary Diagnosis: Schizophrenia (HCC) Long Term Goal(s): Knowledge of disease and therapeutic regimen to maintain health will improve  Short Term Goals: Ability to remain free from injury will improve, Ability to verbalize frustration and  anger appropriately will improve, Ability to demonstrate self-control, Ability to participate in decision making will improve, Ability to verbalize feelings will improve, Ability to disclose and discuss suicidal ideas,  Ability to identify and develop effective coping behaviors will improve, and Compliance with prescribed medications will improve  Medication Management: RN will administer medications as ordered by provider, will assess and evaluate patient's response and provide education to patient for prescribed medication. RN will report any adverse and/or side effects to prescribing provider.  Therapeutic Interventions: 1 on 1 counseling sessions, Psychoeducation, Medication administration, Evaluate responses to treatment, Monitor vital signs and CBGs as ordered, Perform/monitor CIWA, COWS, AIMS and Fall Risk screenings as ordered, Perform wound care treatments as ordered.  Evaluation of Outcomes: Not Progressing   LCSW Treatment Plan for Primary Diagnosis: Schizophrenia (HCC) Long Term Goal(s): Safe transition to appropriate next level of care at discharge, Engage patient in therapeutic group addressing interpersonal concerns.  Short Term Goals: Engage patient in aftercare planning with referrals and resources, Increase social support, Increase ability to appropriately verbalize feelings, Increase emotional regulation, Facilitate acceptance of mental health diagnosis and concerns, Facilitate patient progression through stages of change regarding substance use diagnoses and concerns, Identify triggers associated with mental health/substance abuse issues, and Increase skills for wellness and recovery  Therapeutic Interventions: Assess for all discharge needs, 1 to 1 time with Social worker, Explore available resources and support systems, Assess for adequacy in community support network, Educate family and significant other(s) on suicide prevention, Complete Psychosocial Assessment, Interpersonal group therapy.  Evaluation of Outcomes: Not Progressing   Progress in Treatment: Attending groups:  Patient was just admitted to the hospital, and the group sessions have not yet been held Participating in groups:  N/A Taking medication as prescribed: Yes. Toleration medication: Yes. Family/Significant other contact made: consents are pending Patient understands diagnosis: No. Discussing patient identified problems/goals with staff: No. Medical problems stabilized or resolved: Yes. Denies suicidal/homicidal ideation: Yes. Issues/concerns per patient self-inventory: No.  New problem(s) identified:  No  New Short Term/Long Term Goal(s):    medication stabilization, elimination of SI thoughts, development of comprehensive mental wellness plan.    Patient Goals:  When I'm anxious, I get tired easily.  I want to be discharged.   Discharge Plan or Barriers:  Patient recently admitted. CSW will continue to follow and assess for appropriate referrals and possible discharge planning.   Reason for Continuation of Hospitalization: Hallucinations Medication stabilization Suicidal ideation  Estimated Length of Stay:  5 - 7 days  Last 3 [place] Suicide Severity Risk Score: Flowsheet Row [facility] ED from 09/25/2021 in [facility]  C-SSRS RISK CATEGORY No Risk No Risk No Risk    Last [facility] 2/9 Scores:    02/21/2021    4:55 PM  Depression screen PHQ 2/9  Decreased Interest 1  Down, Depressed, Hopeless 1  PHQ - 2 Score 2  Altered sleeping 1  Tired, decreased energy 0  Change in appetite 0  Feeling bad or failure about yourself  1  Trouble concentrating 0  Moving slowly or fidgety/restless 0  Suicidal thoughts 0  PHQ-9 Score 4  Difficult doing work/chores Somewhat difficult    Scribe for Treatment Team: Byrd Rushlow O Tylyn Stankovich, LCSWA 11/07/2023 6:44 PM

## 2023-11-07 NOTE — Progress Notes (Signed)
(  Sleep Hours) -9.5 (Any PRNs that were needed, meds refused, or side effects to meds)- prn hydroxyzine  25mg , trazodone  50mg  @ 2047 (Any disturbances and when (visitation, over night)-none (Concerns raised by the patient)- none (SI/HI/AVH)- Denies all

## 2023-11-07 NOTE — Plan of Care (Signed)
   Problem: Education: Goal: Emotional status will improve Outcome: Progressing Goal: Mental status will improve Outcome: Progressing Goal: Verbalization of understanding the information provided will improve Outcome: Progressing   Problem: Activity: Goal: Interest or engagement in activities will improve Outcome: Progressing

## 2023-11-07 NOTE — BHH Counselor (Signed)
 Adult Comprehensive Assessment  Patient ID: Garrett Bolton, male   DOB: [birth year], 23 y.o.   MRN: [id number]  Information Source: Information source: Patient  Current Stressors:  Patient states their primary concerns and needs for treatment are:: my feet were bothering me so I can called ambulance.  I came here to get my medicine. Patient states their goals for this hospitilization and ongoing recovery are:: I was trying to see if I could leave today. Educational / Learning stressors: no Employment / Job issues: yes, I'm looking for work at [employer]. Family Relationships: yes Financial / Lack of resources (include bankruptcy): no Housing / Lack of housing: a little bit Physical health (include injuries & life threatening diseases): I have a blister on my foot on and off, but it's getting better. Social relationships: no Substance abuse: no Bereavement / Loss: no  Living/Environment/Situation:  Living conditions (as described by patient or guardian): Ive been staying in the shelter so I wanted to get there today, before they close, so I can get the room Who else lives in the home?: [employer] How long has patient lived in current situation?: I've been at the shelter for 1 day.  Before that I was staying with my sister at [address] in [place]. What is atmosphere in current home: Dangerous  Family History:  Marital status: Single Are you sexually active?: Yes What is your sexual orientation?: heterosexual Has your sexual activity been affected by drugs, alcohol, medication, or emotional stress?: when I came here, we had a break Does patient have children?: No  Childhood History:  By whom was/is the patient raised?: Mother, Sibling Additional childhood history information: My mom and my sister raised me.  According to the information in the file, grandmother was involved in his life during childhood, while his father was  sporadically involved. Description of patient's relationship with caregiver when they were a child: good Patient's description of current relationship with people who raised him/her: good How were you disciplined when you got in trouble as a child/adolescent?: I was standing at [employer]. Does patient have siblings?: Yes Number of Siblings: 1 Description of patient's current relationship with siblings: I have a sister.  I was staying with my sister, but her boyfriend and I didn't get along.  I'm wating to hear back from an apartment. Did patient suffer any verbal/emotional/physical/sexual abuse as a child?: No Did patient suffer from severe childhood neglect?: No Has patient ever been sexually abused/assaulted/raped as an adolescent or adult?: No Was the patient ever a victim of a crime or a disaster?: No Witnessed domestic violence?: No Has patient been affected by domestic violence as an adult?: No  Education:  Highest grade of school patient has completed: I completed the 12th grade. Currently a student?: No Learning disability?: No  Employment/Work Situation:   Employment Situation: Employed Where is Patient Currently Employed?: Patient is currently not working. How Long has Patient Been Employed?: N/A Are You Satisfied With Your Job?: Yes Do You Work More Than One Job?: No Work Stressors: I have been working at [employer] for 2 years.  I wish I still had the job. Patient's Job has Been Impacted by Current Illness: Yes Describe how Patient's Job has Been Impacted: I wish I had my job back. 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?: No  Financial Resources:   Financial resources:  No income Does patient have a representative payee or guardian?: Yes Name of representative payee or guardian: My mom is my legal guardian. You may have to call  her.  Alcohol/Substance Abuse:   What has been your use of drugs/alcohol within the last 12 months?: none reported If attempted suicide, did drugs/alcohol play a role in this?: No If yes, describe treatment: no Has alcohol/substance abuse ever caused legal problems?: No  Social Support System:   Describe Community Support System: My mom is helping me to get an apartment. Type of faith/religion: Hinduism How does patient's faith help to cope with current illness?: I pray before I eat  Leisure/Recreation:   Do You Have Hobbies?: Yes Leisure and Hobbies: I light candles  Strengths/Needs:   What is the patient's perception of their strengths?: I like to read. Patient states they can use these personal strengths during their treatment to contribute to their recovery: nothing Patient states these barriers may affect/interfere with their treatment: none reported Patient states these barriers may affect their return to the community: none reported Other important information patient would like considered in planning for their treatment: none reported  Discharge Plan:   Patient states concerns and preferences for aftercare planning are: I did already Patient states they will know when they are safe and ready for discharge when: today Does patient have access to transportation?: No Patient description of barriers related to discharge medications: i did, but they never put the money on my card. Plan for no access to transportation at discharge: i need a bus pass. Will patient be returning to same living situation after discharge?: Yes  Summary/Recommendations:   Summary and Recommendations (to be completed by the evaluator): Garrett Bolton is a 23 year old involuntarily admitted to [facility] due to severe impairment of reality assessment, judgment, logical thinking and planning that result in being an imminent danger to self or others.  During the assessment, patient was polite,  however wasn't coherent.  He became confused whether he is currently working or not, and came to the conclusion he is not working.  He said that he would like to find a job.  He often smiled at innapropriate times during the assessment. He reported that he was living with his sister, but didn't get along with her boyfriend, so the previous night he stayed in a homeless shelter.  He wanted to leave the hospital today, so he can secure a bed in the shelter.  He said that his mom is his legal guardian, and is looking for an apartment for him.  At admission, patient tested positive for marijuana.  While here, Garrett Bolton can benefit from crisis stabilization, medication management, therapeutic milieu, and referrals for services.   Garrett Bolton O Lanisa Ishler, LCSWA  11/07/2023
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
## 2023-11-08 DIAGNOSIS — F209 Schizophrenia, unspecified: Secondary | ICD-10-CM | POA: Diagnosis not present

## 2023-11-08 DIAGNOSIS — F1290 Cannabis use, unspecified, uncomplicated: Secondary | ICD-10-CM | POA: Diagnosis not present

## 2023-11-08 LAB — LIPID PANEL
Cholesterol: 120 mg/dL (ref 0–200)
HDL: 56 mg/dL (ref >40)
LDL Cholesterol: 53 mg/dL (ref 0–99)
Total CHOL/HDL Ratio: 2.1 ratio
Triglycerides: 56 mg/dL (ref <150)
VLDL: 11 mg/dL (ref 0–40)

## 2023-11-08 LAB — HEMOGLOBIN A1C
Hgb A1c MFr Bld: 4.5 % — ABNORMAL LOW (ref 4.8–5.6)
Mean Plasma Glucose: 82.45 mg/dL

## 2023-11-08 LAB — TSH: TSH: 0.851 u[IU]/mL (ref 0.350–4.500)

## 2023-11-08 NOTE — Plan of Care (Signed)
  Problem: Education: Goal: Knowledge of [facility] Education information/materials will improve Outcome: Progressing Goal: Mental status will improve Outcome: Progressing   Problem: Activity: Goal: Sleeping patterns will improve Outcome: Progressing   Problem: Coping: Goal: Ability to demonstrate self-control will improve Outcome: Progressing   Problem: Health Behavior/Discharge Planning: Goal: Identification of resources available to assist in meeting health care needs will improve Outcome: Progressing

## 2023-11-08 NOTE — Progress Notes (Signed)
(  Sleep Hours) -11.25 (Any PRNs that were needed, meds refused, or side effects to meds)- none (Any disturbances and when (visitation, over night)-none (Concerns raised by the patient)- none (SI/HI/AVH)-pt would not answer

## 2023-11-08 NOTE — [facility] Suicide Risk Assessment (Addendum)
 [facility] INPATIENT:  Family/Significant Other Suicide Prevention Education  Suicide Prevention Education:  Education Completed; Garrett Bolton (mom / legal guardian) [phone],  (name of family member/significant other) has been identified by the patient as the family member/significant other with whom the patient will be residing, and identified as the person(s) who will aid the patient in the event of a mental health crisis (suicidal ideations/suicide attempt).  With written consent from the patient, the family member/significant other has been provided the following suicide prevention education, prior to the and/or following the discharge of the patient.  Mom said that she is patient's legal guardian and patient's sister, Darvell Monteforte, will drop off the paperwork.  Mom said someone will be available to pick up patient upon discharge.  Mom said that until recently patient was staying with his sister in [place], but experienced symptoms, used marijuana and was on streets, so he couldn't stay with her any longer, and became homeless.  Mom said she is in the process of applying for social security benefits for patient.    Mom said that she lives in [place], [place] (2 hours away).  She lives with someone, so patient can't stay with her.  Mom said patient doesn't have any guns or weapons.    Mom said that patient was using marijuana, and walked and walked for so long that he developed blisters on his feet.  Mom wasn't sure if patient is receiving ACTT services ([facility]), or has other providers.  She believes he has Medicaid.  Mom will speak with her daughter (patient's sister), and will call CSW with this information.  The suicide prevention education provided includes the following: Suicide risk factors Suicide prevention and interventions National Suicide Hotline telephone number [facility] assessment telephone number [facility] Emergency Assistance  911 [facility] and/or Residential Mobile Crisis Unit telephone number  Request made of family/significant other to: Remove weapons (e.g., guns, rifles, knives), all items previously/currently identified as safety concern.     The family member/significant other verbalizes understanding of the suicide prevention education information provided.  The family member/significant other agrees to remove the items of safety concern listed above.  Avenir Lozinski O Michel Hendon, LCSWA 11/08/2023, 7:35 PM

## 2023-11-08 NOTE — Progress Notes (Signed)
   11/08/23 2217  Psych Admission Type (Psych Patients Only)  Admission Status Involuntary  Psychosocial Assessment  Patient Complaints None  Eye Contact Darting  Facial Expression Animated  Affect Preoccupied  Speech Soft  Interaction Isolative  Motor Activity Fidgety  Appearance/Hygiene Bizarre;Disheveled  Behavior Characteristics Guarded  Mood Preoccupied  Aggressive Behavior  Effect No apparent injury  Thought Process  Coherency Blocking  Content UTA  Delusions UTA  Perception UTA  Hallucination UTA  Judgment Impaired  Confusion Mild  Danger to Self  Current suicidal ideation? Denies  Danger to Others  Danger to Others None reported or observed

## 2023-11-08 NOTE — Plan of Care (Signed)

## 2023-11-08 NOTE — Progress Notes (Signed)
   11/08/23 0830  Psych Admission Type (Psych Patients Only)  Admission Status Involuntary  Psychosocial Assessment  Patient Complaints None  Eye Contact Darting  Facial Expression Animated  Affect Preoccupied  Speech Soft  Interaction Isolative  Motor Activity Fidgety  Appearance/Hygiene Disheveled  Behavior Characteristics Guarded  Mood Preoccupied  Thought Process  Coherency Blocking  Content UTA  Delusions UTA;Paranoid  Perception UTA  Hallucination UTA  Judgment Impaired  Confusion Mild  Danger to Self  Current suicidal ideation? Denies  Danger to Others  Danger to Others None reported or observed

## 2023-11-08 NOTE — Progress Notes (Signed)
 Psychoeducational Group Note  Date:  11/08/2023 Time:  2030  Group Topic/Focus:  Wrap-Up Group:   The focus of this group is to help patients review their daily goal of treatment and discuss progress on daily workbooks.  Participation Level: Did Not Attend  Participation Quality:  Not Applicable  Affect:  Not Applicable  Cognitive:  Not Applicable  Insight:  Not Applicable  Engagement in Group: Not Applicable  Additional Comments:  Patient did not attend group this evening.   Shaconda Hajduk S 11/08/2023, 8:30 PM
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
## 2023-11-09 DIAGNOSIS — F1290 Cannabis use, unspecified, uncomplicated: Secondary | ICD-10-CM | POA: Diagnosis not present

## 2023-11-09 DIAGNOSIS — F209 Schizophrenia, unspecified: Secondary | ICD-10-CM | POA: Diagnosis not present

## 2023-11-09 LAB — RPR: RPR Ser Ql: NONREACTIVE

## 2023-11-09 MED ORDER — RISPERIDONE 2 MG PO TBDP
2.0000 mg | ORAL_TABLET | Freq: Two times a day (BID) | ORAL | Status: AC
  Administered 2023-11-09 – 2023-11-15 (×14): 2 mg via ORAL
  Filled 2023-11-09 (×14): qty 1

## 2023-11-09 NOTE — Plan of Care (Signed)
  Problem: Education: Goal: Knowledge of [facility] Education information/materials will improve Outcome: Progressing Goal: Verbalization of understanding the information provided will improve Outcome: Progressing   Problem: Coping: Goal: Ability to verbalize frustrations and anger appropriately will improve Outcome: Progressing   Problem: Activity: Goal: Sleeping patterns will improve Outcome: Progressing

## 2023-11-09 NOTE — Progress Notes (Signed)
   11/09/23 0915  Psych Admission Type (Psych Patients Only)  Admission Status Involuntary  Psychosocial Assessment  Patient Complaints None  Eye Contact Darting  Facial Expression Animated  Affect Preoccupied  Speech Soft  Interaction Cautious  Motor Activity Fidgety  Appearance/Hygiene Bizarre;Disheveled  Behavior Characteristics Guarded  Mood Preoccupied  Thought Process  Coherency Blocking  Content UTA  Delusions Paranoid  Perception UTA  Hallucination UTA  Judgment Impaired  Confusion Mild  Danger to Self  Current suicidal ideation? Denies  Danger to Others  Danger to Others None reported or observed

## 2023-11-09 NOTE — Progress Notes (Signed)
 [facility] MD Progress Note  11/09/2023 1:09 PM Garrett [facility]  MRN:  [id number] Subjective:   Garrett Bolton is a 23 y.o. male with a past psychiatric history of schizophrenia, unspecific schizophrenia spectrum disorder, substance induced psychosis, history of cannabis use who presents involuntarily to [facility] from [facility] emergency department due to thought blocking, paranoia, disorganization and erratic behavior in the setting of medication noncompliance.  He was transferred here for symptom stabilization and medication management. Denies issues with sleep or appetite.     24 hour events:  (Sleep Hours) -7 (Any PRNs that were needed, meds refused, or side effects to meds)- none (Any disturbances and when (visitation, over night)-none (Concerns raised by the patient)- none (SI/HI/AVH)-would not answer  Per nurse: Patient pleasant, cooperative, taking medications, no issues overnight   Patient interview:   On morning assessment, patient still somewhat disorganized, bizarre behavior, responding to internal stimuli and circumstantial with answers.  States that he came to get evaluated for his feet and to get his meds the one that starts with the R. Denies any side effects. Patient has spoken with sister and she is a possible location for discharge. Consent to provider speaking with sister to confirm.  Patient denies suicidal ideations, homicidal ideations, auditory or visual hallucinations.   Garrett [facility], [phone], 1300 8/22  Attempted to call x1, will re-attempt to gain for further collateral and discuss further dispo planning   Principal Problem: Schizophrenia (HCC) Diagnosis: Principal Problem:   Schizophrenia (HCC) Active Problems:   Episodic cannabis use  Total Time spent with patient: 30 minutes  Past Psychiatric History:   Previous Psych Diagnoses: schizophrenia, unspecific schizophrenia spectrum disorder, substance induced psychosis, history of  cannabis use Prior inpatient treatment: Multiple, 03/2019, 04/2019, 04/2019, 02/2020, 12/2020 and per chart review last hospitalization was with old [facility] in 2023 Current/prior outpatient treatment: Yes, not currently following with psychiatry Prior rehab hx: Unable to assess due to disorganization Psychotherapy hx: Unable to assess due to disorganization History of suicide: Unable to assess due to disorganization History of homicide or aggression: Unable to assess due to disorganization Psychiatric medication history: Per chart review Invega , Risperdal , Depakote , trazodone , hydroxyzine , benztropine , Haldol  Psychiatric medication compliance history: Noncompliance Neuromodulation history: Unaware Current Psychiatrist: Denies Current therapist: Denies  Past Medical History:  Past Medical History:  Diagnosis Date   Anemia    Sickle cell trait (HCC)     Past Surgical History:  Procedure Laterality Date   CIRCUMCISION  2001   LAPAROTOMY N/A 08/23/2019   Procedure: EXPLORATORY LAPAROTOMY;  Surgeon: Sebastian Moles, MD;  Location: [facility] OR;  Service: General;  Laterality: N/A;   Family History: History reviewed. No pertinent family history. Family Psychiatric  History:  Psych:depression on mother side,  bipolar and schizophrenia in paternal great grandparents per EMR  Psych Mk:Ejupzwu unable to disclose due to disorganization SA/HA:Patient unable to disclose due to disorganization Substance use family yk:Ejupzwu unable to disclose due to disorganization  Social History:  Social History   Substance and Sexual Activity  Alcohol Use Yes     Social History   Substance and Sexual Activity  Drug Use Yes   Types: Methamphetamines, Marijuana   Comment: Daily    Social History   Socioeconomic History   Marital status: Single    Spouse name: Not on file   Number of children: Not on file   Years of education: Not on file   Highest education level: Not on file  Occupational History    Not on  file  Tobacco Use   Smoking status: Some Days    Current packs/day: 0.25    Average packs/day: 0.3 packs/day for 4.0 years (1.0 ttl pk-yrs)    Types: Cigars, Cigarettes   Smokeless tobacco: Never  Vaping Use   Vaping status: Never Used  Substance and Sexual Activity   Alcohol use: Yes   Drug use: Yes    Types: Methamphetamines, Marijuana    Comment: Daily   Sexual activity: Never  Other Topics Concern   Not on file  Social History Narrative   Not on file   Social Drivers of Health   Financial Resource Strain: Not on file  Food Insecurity: Food Insecurity Present (11/06/2023)   Hunger Vital Sign    Worried About Running Out of Food in the Last Year: Often true    Ran Out of Food in the Last Year: Often true  Transportation Needs: Unmet Transportation Needs (11/06/2023)   PRAPARE - Administrator, Civil Service (Medical): Yes    Lack of Transportation (Non-Medical): Yes  Physical Activity: Not on file  Stress: Not on file  Social Connections: Not on file   Additional Social History:                         Sleep: Good Estimated Sleeping Duration (Last 24 Hours): 6.50-6.75 hours  Appetite:  Good  Current Medications: Current Facility-Administered Medications  Medication Dose Route Frequency Provider Last Rate Last Admin   acetaminophen  (TYLENOL ) tablet 650 mg  650 mg Oral Q6H PRN Coleman, Carolyn H, NP       alum & mag hydroxide-simeth (MAALOX/MYLANTA) 200-200-20 MG/5ML suspension 30 mL  30 mL Oral Q4H PRN Mardy Elveria DEL, NP       feeding supplement (ENSURE PLUS HIGH PROTEIN) liquid 237 mL  237 mL Oral BID BM Towana Leita SAILOR, MD   237 mL at 11/09/23 1007   hydrOXYzine  (ATARAX ) tablet 25 mg  25 mg Oral TID PRN Coleman, Carolyn H, NP   25 mg at 11/06/23 2047   magnesium  hydroxide (MILK OF MAGNESIA) suspension 30 mL  30 mL Oral Daily PRN Mardy Elveria DEL, NP       OLANZapine  (ZYPREXA ) injection 10 mg  10 mg Intramuscular TID PRN Lenard Calin, MD       OLANZapine  (ZYPREXA ) injection 5 mg  5 mg Intramuscular TID PRN Lenard Calin, MD       OLANZapine  zydis (ZYPREXA ) disintegrating tablet 5 mg  5 mg Oral TID PRN Lenard Calin, MD       risperiDONE  (RISPERDAL  M-TABS) disintegrating tablet 2 mg  2 mg Oral BID Lenard Calin, MD   2 mg at 11/09/23 0804   traZODone  (DESYREL ) tablet 50 mg  50 mg Oral QHS PRN Mardy Elveria DEL, NP   50 mg at 11/06/23 2047    Lab Results:  Results for orders placed or performed during the hospital encounter of 11/06/23 (from the past 48 hours)  Lipid panel     Status: None   Collection Time: 11/08/23  6:36 PM  Result Value Ref Range   Cholesterol 120 0 - 200 mg/dL   Triglycerides 56 <849 mg/dL   HDL 56 >59 mg/dL   Total CHOL/HDL Ratio 2.1 RATIO   VLDL 11 0 - 40 mg/dL   LDL Cholesterol 53 0 - 99 mg/dL    Comment:        Total Cholesterol/HDL:CHD Risk Coronary Heart Disease Risk Table  Men   Women  1/2 Average Risk   3.4   3.3  Average Risk       5.0   4.4  2 X Average Risk   9.6   7.1  3 X Average Risk  23.4   11.0        Use the calculated Patient Ratio above and the CHD Risk Table to determine the patient's CHD Risk.        ATP III CLASSIFICATION (LDL):  <100     mg/dL   Optimal  899-870  mg/dL   Near or Above                    Optimal  130-159  mg/dL   Borderline  839-810  mg/dL   High  >809     mg/dL   Very High Performed at [facility], [address] [place], [place] [address]   Hemoglobin A1c     Status: Abnormal   Collection Time: 11/08/23  6:36 PM  Result Value Ref Range   Hgb A1c MFr Bld 4.5 (L) 4.8 - 5.6 %    Comment: (NOTE) Diagnosis of Diabetes The following HbA1c ranges recommended by the American Diabetes Association (ADA) may be used as an aid in the diagnosis of diabetes mellitus.  Hemoglobin             Suggested A1C NGSP%              Diagnosis  <5.7                   Non Diabetic  5.7-6.4                 Pre-Diabetic  >6.4                   Diabetic  <7.0                   Glycemic control for                       adults with diabetes.     Mean Plasma Glucose 82.45 mg/dL    Comment: Performed at [facility], [address] [place], [place] [address]  TSH     Status: None   Collection Time: 11/08/23  6:36 PM  Result Value Ref Range   TSH 0.851 0.350 - 4.500 uIU/mL    Comment: Performed by a 3rd Generation assay with a functional sensitivity of <=0.01 uIU/mL. Performed at [facility], [address] [place], [place] [address]   RPR     Status: None   Collection Time: 11/08/23  6:36 PM  Result Value Ref Range   RPR Ser Ql NON REACTIVE NON REACTIVE    Comment: Performed at [facility], [address], [place], [place] [address]    Blood Alcohol level:  Lab Results  Component Value Date   [facility] <15 11/05/2023   ETH <10 08/14/2021    Metabolic Disorder Labs: Lab Results  Component Value Date   HGBA1C 4.5 (L) 11/08/2023   MPG 82.45 11/08/2023   MPG 79.58 08/14/2021   Lab Results  Component Value Date   PROLACTIN 15.2 04/21/2019   Lab Results  Component Value Date   CHOL 120 11/08/2023   TRIG 56 11/08/2023   HDL 56 11/08/2023   CHOLHDL 2.1 11/08/2023   VLDL 11 11/08/2023   LDLCALC  53 11/08/2023   LDLCALC 90 01/01/2021    Physical Findings: AIMS:  ,  ,  ,  ,  ,  ,   CIWA:    COWS:     Musculoskeletal: Strength & Muscle Tone: within normal limits Gait & Station: normal Patient leans: N/A  Psychiatric Specialty Exam:  Presentation  General Appearance:  Bizarre; Casual  Eye Contact: Fleeting  Speech: Clear and Coherent; Normal Rate  Speech Volume: Normal  Handedness: Right   Mood and Affect  Mood: Euthymic  Affect: Non-Congruent; Full Range; Restricted   Thought Process  Thought Processes: Disorganized; Other (comment) (more linear than admission)  Descriptions of  Associations:Circumstantial  Orientation:Partial  Thought Content:Illogical; Paranoid Ideation  History of Schizophrenia/Schizoaffective disorder:Yes  Duration of Psychotic Symptoms:Greater than six months  Hallucinations:Hallucinations: Other (comment) (appears to be responding to internal stimuli)  Ideas of Reference:Paranoia  Suicidal Thoughts:Suicidal Thoughts: No  Homicidal Thoughts:Homicidal Thoughts: No   Sensorium  Memory: Immediate Poor; Recent Poor  Judgment: Poor  Insight: Poor   Executive Functions  Concentration: Poor  Attention Span: Poor  Recall: Poor  Fund of Knowledge: Fair  Language: Fair   Psychomotor Activity  Psychomotor Activity: Psychomotor Activity: Normal  Assets  Assets: Physical Health   Sleep  Sleep: Sleep: Good Number of Hours of Sleep: 7    Physical Exam: Physical Exam Constitutional:      General: He is not in acute distress.    Appearance: He is not toxic-appearing or diaphoretic.  Eyes:     Conjunctiva/sclera: Conjunctivae normal.  Pulmonary:     Effort: Pulmonary effort is normal.  Musculoskeletal:        General: Normal range of motion.  Neurological:     General: No focal deficit present.     Mental Status: He is alert.    Review of Systems  Constitutional:  Negative for chills, diaphoresis and fever.  Respiratory:  Negative for cough.   Gastrointestinal:  Negative for nausea and vomiting.  Neurological:  Negative for headaches.  Psychiatric/Behavioral:  Negative for depression, hallucinations, substance abuse and suicidal ideas. The patient is not nervous/anxious and does not have insomnia.    Blood pressure 110/71, pulse 75, temperature 98.5 F (36.9 C), resp. rate 14, height 5' 10 (1.778 m), weight 55.3 kg, SpO2 100%. Body mass index is 17.51 kg/m.  Treatment Plan Summary: [facility] is a 23 y.o. male with a past psychiatric history of schizophrenia, unspecific schizophrenia  spectrum disorder, substance induced psychosis, history of cannabis use who presents involuntarily to [facility] from [facility] emergency department due to thought blocking, paranoia, disorganization and erratic behavior in the setting of medication noncompliance.  He was transferred here for symptom stabilization and medication management.   On intake assessment, patient does appear to be psychotic given disorganization, responding to internal stimuli on the floor throughout interview and lack of insight of events leading up to hospitalization.  Patient is calm and cooperative throughout interview and has not required any agitation protocol.  Patient reports that he was previously on risperidone  help control symptoms.  However per the pharmacy dispensed report patient has not recently had risperidone  filled (no record of dispenses since 04/2023).  Patient has been hospitalized here multiple times before and seemed to do well on risperidone .  Will restart a low dose and continue to adjust to optimize symptom stabilization and hopes of transitioning to [facility].  We will call mother for further collateral after given patient's consent during intake assessment.  8/22:  On  morning assessment, patients disorganization slightly improved, bizarre and appears to be responding less internally throughout exam.  Risperdal  increased for symptom optimization. Patient has not required any agitation protocol and has been cooperative on the unit.  No incidents with roommate or other concerns noted at this time. Will re-attempt to call sister, to discuss possible dispo placement with her and paperwork proving mom is legal guardian. Per social work documentation someone can pick up the patient when ready for discharge.    Diagnoses / Active Problems: Schizophrenia  Cannabis Use (per UDS)    PLAN: Safety and Monitoring:             --  Involuntary admission to inpatient psychiatric unit for safety, stabilization and treatment              -- Daily contact with patient to assess and evaluate symptoms and progress in treatment             -- Patient's case to be discussed in multi-disciplinary team meeting             -- Observation Level : q15 minute checks             -- Vital signs:  q12 hours             -- Precautions: suicide, elopement, and assault   2. Psychiatric Diagnoses and Treatment: --Increased Risperdal  2 mg twice daily for psychosis, we will consider increasing dose as needed throughout hospitalization and consider long-acting injectable prior to discharge the patient --As needed agitation protocol ordered, Zyprexa  ordered and discontinued Haldol  given patient prior history of acute dystonic reaction with Haldol  -- PRNs available: Detailed in the [facility] --  The risks/benefits/side-effects/alternatives to this medication were discussed in detail with the patient and time was given for questions. The patient consents to medication trial.              -- Metabolic profile and EKG monitoring obtained while on an atypical antipsychotic (BMI: Lipid Panel: HbgA1c: QTc:)             -- f/u Lipid, A1c, TSH and RPR              -- Encouraged patient to participate in unit milieu and in scheduled group therapies              -- Short Term Goals: Ability to identify changes in lifestyle to reduce recurrence of condition will improve, Ability to maintain clinical measurements within normal limits will improve, Compliance with prescribed medications will improve, and Ability to identify triggers associated with substance abuse/mental health issues will improve             -- Long Term Goals: Improvement in symptoms so as ready for discharge   3. Medical Issues Being Addressed:              Cannabis Use              -- UDS positive for THC              -- Smoking cessation encouraged   4. Discharge Planning:              -- Social work and case management to assist with discharge planning and identification of hospital  follow-up needs prior to discharge             -- Estimated LOS: 5-7 days             --  Discharge Concerns: Need to establish a safety plan; Medication compliance and effectiveness             -- Discharge Goals: Return home with outpatient referrals for mental health follow-up including medication management/psychotherapy   I certify that inpatient services furnished can reasonably be expected to improve the patient's condition.    PATTI OLDEN, MD 11/09/2023, 1:09 PM

## 2023-11-09 NOTE — Progress Notes (Signed)
(  Sleep Hours) -7 (Any PRNs that were needed, meds refused, or side effects to meds)- none (Any disturbances and when (visitation, over night)-none (Concerns raised by the patient)- none (SI/HI/AVH)-would not answer

## 2023-11-09 NOTE — [facility] Group Notes (Signed)

## 2023-11-09 NOTE — Plan of Care (Signed)
   Problem: Education: Goal: Knowledge of [place] [facility] Education information/materials will improve Outcome: Progressing Goal: Emotional status will improve Outcome: Progressing Goal: Mental status will improve Outcome: Progressing

## 2023-11-09 NOTE — Group Note (Signed)
 Date:  11/09/2023 Time:  8:41 PM  Group Topic/Focus:  Wrap-Up Group:   The focus of this group is to help patients review their daily goal of treatment and discuss progress on daily workbooks.    Participation Level:  Did Not Attend  Participation Quality:  Did Not Attend  Affect:  Did Not Attend  Cognitive:  Did Not Attend   Insight: None  Engagement in Group:  Did not Attend  Modes of Intervention:  Did not attend  Additional Comments:  Pt was encouraged to attend wrap up group but did not attend.  Lonni Na 11/09/2023, 8:41 PM

## 2023-11-09 NOTE — [facility] Group Notes (Signed)
 Adult Psychoeducational Group Note  Date:  11/09/2023 Time:  7:06 PM  Group Topic/Focus:  Goals Group:   The focus of this group is to help patients establish daily goals to achieve during treatment and discuss how the patient can incorporate goal setting into their daily lives to aide in recovery. Orientation:   The focus of this group is to educate the patient on the purpose and policies of crisis stabilization and provide a format to answer questions about their admission.  The group details unit policies and expectations of patients while admitted.  Participation Level:  Active  Participation Quality:  Appropriate  Affect:  Appropriate  Cognitive:  Disorganized  Insight: Lacking  Engagement in Group:  Engaged  Modes of Intervention:  Discussion  Additional Comments:  Pt attended the goals group and remained appropriate and engaged throughout the duration of the group.   Giovonnie Trettel O 11/09/2023, 7:06 PM

## 2023-11-10 DIAGNOSIS — F209 Schizophrenia, unspecified: Secondary | ICD-10-CM | POA: Diagnosis not present

## 2023-11-10 DIAGNOSIS — F1290 Cannabis use, unspecified, uncomplicated: Secondary | ICD-10-CM | POA: Diagnosis not present

## 2023-11-10 NOTE — Group Note (Unsigned)
 Date:  11/15/2023 Time:  4:00 PM  Group Topic/Focus:  Goals Group:   The focus of this group is to help patients establish daily goals to achieve during treatment and discuss how the patient can incorporate goal setting into their daily lives to aide in recovery. Orientation:   The focus of this group is to educate the patient on the purpose and policies of crisis stabilization and provide a format to answer questions about their admission.  The group details unit policies and expectations of patients while admitted.    Participation Level:  Active  Participation Quality:  Appropriate  Affect:  Appropriate  Cognitive:  Appropriate  Insight: Appropriate  Engagement in Group:  Engaged  Modes of Intervention:  Discussion  Additional Comments: Pt goal is to work on his discharge plan.   [facility] 11/15/2023, 4:00 PM

## 2023-11-10 NOTE — Group Note (Signed)
 Date:  11/10/2023 Time:  4:55 PM  Group Topic/Focus:  Goals Group:   The focus of this group is to help patients establish daily goals to achieve during treatment and discuss how the patient can incorporate goal setting into their daily lives to aide in recovery. Orientation:   The focus of this group is to educate the patient on the purpose and policies of crisis stabilization and provide a format to answer questions about their admission.  The group details unit policies and expectations of patients while admitted.    Participation Level:  Did Not Attend  Additional Comments:  did not attend  [facility] [name] 11/10/2023, 4:55 PM

## 2023-11-10 NOTE — Progress Notes (Signed)
 [facility] MD Progress Note  11/10/2023 10:52 AM Garrett Bolton  MRN:  [id number] Subjective:   Garrett Bolton is a 23 y.o. male with a past psychiatric history of schizophrenia, unspecific schizophrenia spectrum disorder, substance induced psychosis, history of cannabis use who presents involuntarily to [facility] from [facility] emergency department due to thought blocking, paranoia, disorganization and erratic behavior in the setting of medication noncompliance.  He was transferred here for symptom stabilization and medication management. Denies issues with sleep or appetite.   24 hour events:  (Sleep Hours) -7.75 (Any PRNs that were needed, meds refused, or side effects to meds)- none (Any disturbances and when (visitation, over night)-none (Concerns raised by the patient)- none (SI/HI/AVH)- unable to determine, pt not talking  Per nurse: Taking medications, no issues overnight, still has moments where he just stare at you on the unit  Patient interview:   On morning assessment, patient disorganization slightly improved, intermittent bizarre behavior, responding to internal stimuli and circumstantial with answers. Patient reports that his day was on and off yesterday. Feels like his mood is fine. Patient still focused on discharge. Denies any side effects. Patient denies suicidal ideations, homicidal ideations, auditory or visual hallucinations.  Patient reports that he did talk to mom and sister this morning.  However did not disclose what type of conversation was.   Garrett Bolton, [phone], 1300 8/22  Attempted to call x1, will re-attempt to gain for further collateral and discuss further dispo planning   Principal Problem: Schizophrenia (HCC) Diagnosis: Principal Problem:   Schizophrenia (HCC) Active Problems:   Episodic cannabis use  Total Time spent with patient: 30 minutes  Past Psychiatric History:   Previous Psych Diagnoses: schizophrenia, unspecific  schizophrenia spectrum disorder, substance induced psychosis, history of cannabis use Prior inpatient treatment: Multiple, 03/2019, 04/2019, 04/2019, 02/2020, 12/2020 and per chart review last hospitalization was with old Norbert in 2023 Current/prior outpatient treatment: Yes, not currently following with psychiatry Prior rehab hx: Unable to assess due to disorganization Psychotherapy hx: Unable to assess due to disorganization History of suicide: Unable to assess due to disorganization History of homicide or aggression: Unable to assess due to disorganization Psychiatric medication history: Per chart review Invega , Risperdal , Depakote , trazodone , hydroxyzine , benztropine , Haldol  Psychiatric medication compliance history: Noncompliance Neuromodulation history: Unaware Current Psychiatrist: Denies Current therapist: Denies  Past Medical History:  Past Medical History:  Diagnosis Date   Anemia    Sickle cell trait (HCC)     Past Surgical History:  Procedure Laterality Date   CIRCUMCISION  2001   LAPAROTOMY N/A 08/23/2019   Procedure: EXPLORATORY LAPAROTOMY;  Surgeon: Sebastian Moles, MD;  Location: [facility] OR;  Service: General;  Laterality: N/A;   Family History: History reviewed. No pertinent family history. Family Psychiatric  History:  Psych:depression on mother side,  bipolar and schizophrenia in paternal great grandparents per EMR  Psych Mk:Ejupzwu unable to disclose due to disorganization SA/HA:Patient unable to disclose due to disorganization Substance use family yk:Ejupzwu unable to disclose due to disorganization  Social History:  Social History   Substance and Sexual Activity  Alcohol Use Yes     Social History   Substance and Sexual Activity  Drug Use Yes   Types: Methamphetamines, Marijuana   Comment: Daily    Social History   Socioeconomic History   Marital status: Single    Spouse name: Not on file   Number of children: Not on file   Years of education: Not on  file   Highest education level:  Not on file  Occupational History   Not on file  Tobacco Use   Smoking status: Some Days    Current packs/day: 0.25    Average packs/day: 0.3 packs/day for 4.0 years (1.0 ttl pk-yrs)    Types: Cigars, Cigarettes   Smokeless tobacco: Never  Vaping Use   Vaping status: Never Used  Substance and Sexual Activity   Alcohol use: Yes   Drug use: Yes    Types: Methamphetamines, Marijuana    Comment: Daily   Sexual activity: Never  Other Topics Concern   Not on file  Social History Narrative   Not on file   Social Drivers of Health   Financial Resource Strain: Not on file  Food Insecurity: Food Insecurity Present (11/06/2023)   Hunger Vital Sign    Worried About Running Out of Food in the Last Year: Often true    Ran Out of Food in the Last Year: Often true  Transportation Needs: Unmet Transportation Needs (11/06/2023)   PRAPARE - Administrator, Civil Service (Medical): Yes    Lack of Transportation (Non-Medical): Yes  Physical Activity: Not on file  Stress: Not on file  Social Connections: Not on file   Additional Social History:                         Sleep: Good Estimated Sleeping Duration (Last 24 Hours): 5.50-7.00 hours  Appetite:  Good  Current Medications: Current Facility-Administered Medications  Medication Dose Route Frequency Provider Last Rate Last Admin   acetaminophen  (TYLENOL ) tablet 650 mg  650 mg Oral Q6H PRN Coleman, Carolyn H, NP       alum & mag hydroxide-simeth (MAALOX/MYLANTA) 200-200-20 MG/5ML suspension 30 mL  30 mL Oral Q4H PRN Mardy Elveria DEL, NP       feeding supplement (ENSURE PLUS HIGH PROTEIN) liquid 237 mL  237 mL Oral BID BM Towana Leita SAILOR, MD   237 mL at 11/10/23 9093   hydrOXYzine  (ATARAX ) tablet 25 mg  25 mg Oral TID PRN Mardy Elveria DEL, NP   25 mg at 11/10/23 9156   magnesium  hydroxide (MILK OF MAGNESIA) suspension 30 mL  30 mL Oral Daily PRN Mardy Elveria DEL, NP        OLANZapine  (ZYPREXA ) injection 10 mg  10 mg Intramuscular TID PRN Lenard Calin, MD       OLANZapine  (ZYPREXA ) injection 5 mg  5 mg Intramuscular TID PRN Lenard Calin, MD       OLANZapine  zydis (ZYPREXA ) disintegrating tablet 5 mg  5 mg Oral TID PRN Lenard Calin, MD       risperiDONE  (RISPERDAL  M-TABS) disintegrating tablet 2 mg  2 mg Oral BID Lenard Calin, MD   2 mg at 11/10/23 9156   traZODone  (DESYREL ) tablet 50 mg  50 mg Oral QHS PRN Mardy Elveria DEL, NP   50 mg at 11/06/23 2047    Lab Results:  Results for orders placed or performed during the hospital encounter of 11/06/23 (from the past 48 hours)  Lipid panel     Status: None   Collection Time: 11/08/23  6:36 PM  Result Value Ref Range   Cholesterol 120 0 - 200 mg/dL   Triglycerides 56 <849 mg/dL   HDL 56 >59 mg/dL   Total CHOL/HDL Ratio 2.1 RATIO   VLDL 11 0 - 40 mg/dL   LDL Cholesterol 53 0 - 99 mg/dL    Comment:  Total Cholesterol/HDL:CHD Risk Coronary Heart Disease Risk Table                     Men   Women  1/2 Average Risk   3.4   3.3  Average Risk       5.0   4.4  2 X Average Risk   9.6   7.1  3 X Average Risk  23.4   11.0        Use the calculated Patient Ratio above and the CHD Risk Table to determine the patient's CHD Risk.        ATP III CLASSIFICATION (LDL):  <100     mg/dL   Optimal  899-870  mg/dL   Near or Above                    Optimal  130-159  mg/dL   Borderline  839-810  mg/dL   High  >809     mg/dL   Very High Performed at [facility], [address], [place], [place] [address]   Hemoglobin A1c     Status: Abnormal   Collection Time: 11/08/23  6:36 PM  Result Value Ref Range   Hgb A1c MFr Bld 4.5 (L) 4.8 - 5.6 %    Comment: (NOTE) Diagnosis of Diabetes The following HbA1c ranges recommended by the American Diabetes Association (ADA) may be used as an aid in the diagnosis of diabetes mellitus.  Hemoglobin             Suggested A1C NGSP%               Diagnosis  <5.7                   Non Diabetic  5.7-6.4                Pre-Diabetic  >6.4                   Diabetic  <7.0                   Glycemic control for                       adults with diabetes.     Mean Plasma Glucose 82.45 mg/dL    Comment: Performed at [facility], [address] [place], [place] [address]  TSH     Status: None   Collection Time: 11/08/23  6:36 PM  Result Value Ref Range   TSH 0.851 0.350 - 4.500 uIU/mL    Comment: Performed by a 3rd Generation assay with a functional sensitivity of <=0.01 uIU/mL. Performed at [facility], [address] [place], [place] [address]   RPR     Status: None   Collection Time: 11/08/23  6:36 PM  Result Value Ref Range   RPR Ser Ql NON REACTIVE NON REACTIVE    Comment: Performed at [facility], [address], [place], [place] [address]    Blood Alcohol level:  Lab Results  Component Value Date   [facility] <15 11/05/2023   ETH <10 08/14/2021    Metabolic Disorder Labs: Lab Results  Component Value Date   HGBA1C 4.5 (L) 11/08/2023   MPG 82.45 11/08/2023   MPG 79.58 08/14/2021   Lab Results  Component Value Date   PROLACTIN 15.2 04/21/2019   Lab Results  Component Value Date  CHOL 120 11/08/2023   TRIG 56 11/08/2023   HDL 56 11/08/2023   CHOLHDL 2.1 11/08/2023   VLDL 11 11/08/2023   LDLCALC 53 11/08/2023   LDLCALC 90 01/01/2021    Physical Findings: AIMS:  ,  ,  ,  ,  ,  ,   CIWA:    COWS:     Musculoskeletal: Strength & Muscle Tone: within normal limits Gait & Station: normal Patient leans: N/A  Psychiatric Specialty Exam:  Presentation  General Appearance:  Casual; Bizarre  Eye Contact: Fair  Speech: Clear and Coherent; Normal Rate  Speech Volume: Normal  Handedness: Right   Mood and Affect  Mood: Euthymic  Affect: Full Range; Appropriate   Thought Process  Thought Processes: Other (comment); Coherent (Less disorganized during  interview this morning,)  Descriptions of Associations:Circumstantial  Orientation:Partial  Thought Content:Illogical  History of Schizophrenia/Schizoaffective disorder:Yes  Duration of Psychotic Symptoms:Greater than six months  Hallucinations:Hallucinations: Other (comment) (RIS)  Ideas of Reference:None  Suicidal Thoughts:Suicidal Thoughts: No  Homicidal Thoughts:Homicidal Thoughts: No   Sensorium  Memory: Immediate Fair; Remote Poor  Judgment: Poor  Insight: Shallow   Executive Functions  Concentration: Poor  Attention Span: Poor  Recall: Poor  Fund of Knowledge: Fair  Language: Fair   Psychomotor Activity  Psychomotor Activity: Psychomotor Activity: Normal  Assets  Assets: Physical Health   Sleep  Sleep: Sleep: Good Number of Hours of Sleep: 7.75    Physical Exam: Physical Exam Constitutional:      General: He is not in acute distress.    Appearance: He is not toxic-appearing or diaphoretic.  Eyes:     Conjunctiva/sclera: Conjunctivae normal.  Pulmonary:     Effort: Pulmonary effort is normal.  Musculoskeletal:        General: Normal range of motion.  Neurological:     General: No focal deficit present.     Mental Status: He is alert.    Review of Systems  Constitutional:  Negative for chills, diaphoresis and fever.  Respiratory:  Negative for cough.   Gastrointestinal:  Negative for nausea and vomiting.  Neurological:  Negative for headaches.  Psychiatric/Behavioral:  Negative for depression, hallucinations, substance abuse and suicidal ideas. The patient is not nervous/anxious and does not have insomnia.    Blood pressure 113/82, pulse 81, temperature 98.1 F (36.7 C), resp. rate (!) 22, height 5' 10 (1.778 m), weight 55.3 kg, SpO2 100%. Body mass index is 17.51 kg/m.  Treatment Plan Summary: Garrett Bolton is a 23 y.o. male with a past psychiatric history of schizophrenia, unspecific schizophrenia spectrum  disorder, substance induced psychosis, history of cannabis use who presents involuntarily to [facility] from [facility] emergency department due to thought blocking, paranoia, disorganization and erratic behavior in the setting of medication noncompliance.  He was transferred here for symptom stabilization and medication management.   On intake assessment, patient does appear to be psychotic given disorganization, responding to internal stimuli on the floor throughout interview and lack of insight of events leading up to hospitalization.  Patient is calm and cooperative throughout interview and has not required any agitation protocol.  Patient reports that he was previously on risperidone  help control symptoms.  However per the pharmacy dispensed report patient has not recently had risperidone  filled (no record of dispenses since 04/2023).  Patient has been hospitalized here multiple times before and seemed to do well on risperidone .  Will restart a low dose and continue to adjust to optimize symptom stabilization and hopes of transitioning to  LAI.  We will call mother for further collateral after given patient's consent during intake assessment.  8/23:  On morning assessment, patients disorganization slightly improved, bizarre and appears to be responding less internally throughout exam.  Will maintain Risperdal  throughout the weekend at current dose and consider discussion of LAI prior to discharge.  Patient has not required any agitation protocol and has been cooperative on the unit.  No incidents with roommate or other concerns noted at this time. Will re-attempt to call sister, to discuss possible dispo placement with her and paperwork proving mom is legal guardian. Per social work documentation someone can pick up the patient when ready for discharge.    Diagnoses / Active Problems: Schizophrenia  Cannabis Use (per UDS)    PLAN: Safety and Monitoring:             --  Involuntary admission to inpatient  psychiatric unit for safety, stabilization and treatment             -- Daily contact with patient to assess and evaluate symptoms and progress in treatment             -- Patient's case to be discussed in multi-disciplinary team meeting             -- Observation Level : q15 minute checks             -- Vital signs:  q12 hours             -- Precautions: suicide, elopement, and assault   2. Psychiatric Diagnoses and Treatment: --Continue Risperdal  2 mg twice daily for psychosis, we will consider increasing dose as needed throughout hospitalization and consider long-acting injectable prior to discharge the patient --As needed agitation protocol ordered, Zyprexa  ordered and discontinued Haldol  given patient prior history of acute dystonic reaction with Haldol  -- PRNs available: Detailed in the [facility] --  The risks/benefits/side-effects/alternatives to this medication were discussed in detail with the patient and time was given for questions. The patient consents to medication trial.              -- Metabolic profile and EKG monitoring obtained while on an atypical antipsychotic (BMI: Lipid Panel: HbgA1c: QTc:)             -- Labs unremarkable              -- Encouraged patient to participate in unit milieu and in scheduled group therapies              -- Short Term Goals: Ability to identify changes in lifestyle to reduce recurrence of condition will improve, Ability to maintain clinical measurements within normal limits will improve, Compliance with prescribed medications will improve, and Ability to identify triggers associated with substance abuse/mental health issues will improve             -- Long Term Goals: Improvement in symptoms so as ready for discharge   3. Medical Issues Being Addressed:              Cannabis Use              -- UDS positive for THC              -- Smoking cessation encouraged   4. Discharge Planning:              -- Social work and case management to assist with  discharge planning and identification of hospital follow-up needs prior to discharge             --  Estimated LOS: 5-7 days             -- Discharge Concerns: Need to establish a safety plan; Medication compliance and effectiveness             -- Discharge Goals: Return home with outpatient referrals for mental health follow-up including medication management/psychotherapy   I certify that inpatient services furnished can reasonably be expected to improve the patient's condition.    PATTI OLDEN, MD 11/10/2023, 10:52 AM

## 2023-11-10 NOTE — Progress Notes (Signed)
(  Sleep Hours) -7.75 (Any PRNs that were needed, meds refused, or side effects to meds)- none (Any disturbances and when (visitation, over night)-none (Concerns raised by the patient)- none (SI/HI/AVH)- unable to determine, pt not talking

## 2023-11-10 NOTE — Plan of Care (Signed)

## 2023-11-10 NOTE — Group Note (Signed)
 [facility] LCSW Group Therapy Note    Group Date: 11/10/2023 Start Time: 1030 End Time: 1130  Type of Therapy and Topic:  Group Therapy:  Overcoming Obstacles  Participation Level:  [facility] PARTICIPATION LEVEL: Active  Mood:  Description of Group:   In this group patients will be encouraged to explore what they see as obstacles to their own wellness and recovery. They will be guided to discuss their thoughts, feelings, and behaviors related to these obstacles. The group will process together ways to cope with barriers, with attention given to specific choices patients can make. Each patient will be challenged to identify changes they are motivated to make in order to overcome their obstacles. This group will be process-oriented, with patients participating in exploration of their own experiences as well as giving and receiving support and challenge from other group members.  Therapeutic Goals: 1. Patient will identify personal and current obstacles as they relate to admission. 2. Patient will identify barriers that currently interfere with their wellness or overcoming obstacles.  3. Patient will identify feelings, thought process and behaviors related to these barriers.  Summary of Patient Progress - Patient shared information and obstacles pertaining to losing his job and lack of sleep and identified source of support to help with coping.   Therapeutic Modalities:   Cognitive Behavioral Therapy Solution Focused Therapy Motivational Interviewing Relapse Prevention Therapy   Hunter JONELLE Lever, LCSWA

## 2023-11-10 NOTE — Group Note (Signed)
 Date:  11/10/2023 Time:  8:38 PM  Group Topic/Focus:  Wrap-Up Group:   The focus of this group is to help patients review their daily goal of treatment and discuss progress on daily workbooks.    Participation Level:  Active  Participation Quality:  Appropriate  Affect:  Appropriate  Cognitive:  Appropriate  Insight: Appropriate  Engagement in Group:  Developing/Improving  Modes of Intervention:  Discussion  Additional Comments:  Pt stated his goal for today was to focus on his treatment plan. Pt stated he accomplished his goal today. Pt stated he talked with his doctor and social worker about his care today. Pt rated his overall day a 7 out of 10. Pt stated he made no calls today. Pt stated he felt better about himself today. Pt stated he was able to attend all meals. Pt stated he took all medications provided today. Pt stated he attend all groups held today. Pt stated his appetite was pretty good today. Pt rated sleep last night was pretty good. Pt stated the goal tonight was to get some rest. Pt stated he had some physical pain tonight. Pt stated he had some mild pain in his right and left feet tonight. Pt rated the mild pain in his feet a 4 on the pain level scale. Pt nurse was updated on the situation. Pt deny visual hallucinations and auditory issues tonight. Pt denies thoughts of harming himself or others. Pt stated he would alert staff if anything changed  Lonni Na 11/10/2023, 8:38 PM

## 2023-11-10 NOTE — Progress Notes (Signed)
 Patient presents: Animated mood with some thought blocking but improving. Patient is med compliant and out in the milieu.    SI/HI/AVH: Denies   Plan: Denies   Groups attended: 1/2   Appetite: Adequate. Attended meals.   Sleep: No sleep disturbances reported.   PRNS: Hydroxyzine  po prn for anxiety.   Disturbances: No disturbances. Patient remains cooperative in milieu.    Questions/concerns: No further questions or concerns other than discharge date.    VS: BP 107/74 (BP Location: Right Arm)   Pulse 79   Temp 98.1 F (36.7 C)   Resp (!) 22   Ht 5' 10 (1.778 m)   Wt 55.3 kg   SpO2 100%   BMI 17.51 kg/m

## 2023-11-10 NOTE — Group Note (Signed)
 LCSW Group Therapy Note  @TD @   10:00am-11:00am  Type of Therapy and Topic:  Group Therapy: Gratitude  Participation Level:  Did Not Attend   Description of Group:   In this group, patients shared and discussed the importance of acknowledging the elements in their lives for which they are grateful and how this can positively impact their mood.  The group discussed how bringing the positive elements of their lives to the forefront of their minds can help with recovery from any illness, physical or mental.  An exercise was done as a group in which a list was made of gratitude items in order to encourage participants to consider other potential positives in their lives.  Therapeutic Goals: Patients will discuss quotes about gratitude and explore how a change of attitude can make life more joyful. Patients will identify one or more items for which they are grateful in each of 6 categories:  people, experiences, things, places, skills, and other. Patients will discuss how it is possible to seek out gratitude in even bad situations. Patients will explore how the lack of gratitude can bring them down.   Summary of Patient Progress:  NA  Therapeutic Modalities:   Solution-Focused Therapy Activity    Kourosh Jablonsky O Hong Moring, LCSWA 11/10/2023  12:19 PM

## 2023-11-11 NOTE — Plan of Care (Signed)
   Problem: Education: Goal: Emotional status will improve Outcome: Progressing Goal: Mental status will improve Outcome: Progressing Goal: Verbalization of understanding the information provided will improve Outcome: Progressing   Problem: Activity: Goal: Interest or engagement in activities will improve Outcome: Progressing

## 2023-11-11 NOTE — [facility] Group Notes (Signed)
The patient did not attend group this evening.  

## 2023-11-11 NOTE — Group Note (Signed)
  Date:  11/11/2023 Time:  10:43 AM  Group Topic/Focus:  Goals Group:   The focus of this group is to help patients establish daily goals to achieve during treatment and discuss how the patient can incorporate goal setting into their daily lives to aide in recovery. Orientation:   The focus of this group is to educate the patient on the purpose and policies of crisis stabilization and provide a format to answer questions about their admission.  The group details unit policies and expectations of patients while admitted.    Participation Level:  Active  Participation Quality:  Appropriate  Affect:  Appropriate  Cognitive:  Alert  Insight: Improving  Engagement in Group:  Engaged  Modes of Intervention:  Discussion  Additional Comments:  Pt goal is focusing on his recovery.    Bud Kaeser M Elif Yonts 11/11/2023, 10:43 AM

## 2023-11-11 NOTE — Progress Notes (Signed)
   11/11/23 0825  Psych Admission Type (Psych Patients Only)  Admission Status Involuntary  Psychosocial Assessment  Patient Complaints None  Eye Contact Brief  Facial Expression Flat  Affect Appropriate to circumstance  Speech Logical/coherent  Interaction Guarded  Motor Activity Other (Comment) (WDL)  Appearance/Hygiene Unremarkable  Behavior Characteristics Cooperative  Mood Pleasant  Thought Process  Coherency WDL  Content WDL  Delusions None reported or observed  Perception WDL  Hallucination None reported or observed  Judgment Poor  Confusion None  Danger to Self  Current suicidal ideation? Denies  Agreement Not to Harm Self Yes  Description of Agreement verbal  Danger to Others  Danger to Others None reported or observed

## 2023-11-11 NOTE — Progress Notes (Signed)
(  Sleep Hours) - 7.75 (Any PRNs that were needed, meds refused, or side effects to meds)- No PRN meds given, no meds refused.  (Any disturbances and when (visitation, over night)- None  (Concerns raised by the patient)- None  (SI/HI/AVH)- Denies SI/HI/AVH

## 2023-11-11 NOTE — Progress Notes (Signed)
   11/11/23 0046  Psych Admission Type (Psych Patients Only)  Admission Status Involuntary  Psychosocial Assessment  Patient Complaints None  Eye Contact Brief  Facial Expression Flat  Affect Anxious  Speech Logical/coherent  Interaction Guarded  Motor Activity Other (Comment) (WDL)  Appearance/Hygiene Unremarkable  Behavior Characteristics Cooperative  Mood Anxious  Thought Process  Coherency Blocking  Content WDL  Delusions None reported or observed  Perception WDL  Hallucination None reported or observed  Judgment Poor  Confusion Mild  Danger to Self  Current suicidal ideation? Denies  Agreement Not to Harm Self Yes  Description of Agreement Verbal  Danger to Others  Danger to Others None reported or observed

## 2023-11-11 NOTE — Group Note (Unsigned)
 Date:  11/11/2023 Time:  10:41 AM  Group Topic/Focus:  Goals Group:   The focus of this group is to help patients establish daily goals to achieve during treatment and discuss how the patient can incorporate goal setting into their daily lives to aide in recovery. Orientation:   The focus of this group is to educate the patient on the purpose and policies of crisis stabilization and provide a format to answer questions about their admission.  The group details unit policies and expectations of patients while admitted.     Participation Level:  {[facility] PARTICIPATION OZCZO:77735}  Participation Quality:  {[facility] PARTICIPATION QUALITY:22265}  Affect:  {[facility] AFFECT:22266}  Cognitive:  {[facility] COGNITIVE:22267}  Insight: {B[facility] Insight2:20797}  Engagement in Group:  {[facility] ENGAGEMENT IN HMNLE:77731}  Modes of Intervention:  {[facility] MODES OF INTERVENTION:22269}  Additional Comments:  ***  Delrae Hagey M Torrey Horseman 11/11/2023, 10:41 AM

## 2023-11-11 NOTE — Progress Notes (Signed)
 [facility] MD Progress Note  11/11/2023 8:21 AM Hall Birchard  MRN:  [id number] Subjective:   Drayden Lukas is a 23 y.o. male with a past psychiatric history of schizophrenia, unspecific schizophrenia spectrum disorder, substance induced psychosis, history of cannabis use who presents involuntarily to [facility] from [facility] emergency department due to thought blocking, paranoia, disorganization and erratic behavior in the setting of medication noncompliance.  He was transferred here for symptom stabilization and medication management. Denies issues with sleep or appetite.   24 hour events:  (Sleep Hours) - 7.75 (Any PRNs that were needed, meds refused, or side effects to meds)- No PRN meds given, no meds refused.  (Any disturbances and when (visitation, over night)- None  (Concerns raised by the patient)- None  (SI/HI/AVH)- Denies SI/HI/AVH  Patient interview:   On morning assessment, patient disorganization slightly improved, intermittent bizarre behavior, responding to internal stimuli and circumstantial with answers. Patient reports that his day was on and off yesterday. Feels like his mood is fine. Patient still focused on discharge. Denies any side effects. Patient denies suicidal ideations, homicidal ideations, auditory or visual hallucinations.  Patient reports that he did talk to mom and sister this morning.  However did not disclose what type of conversation was.   Collateral, [name] Ahumada, [phone], 1400 8/22  Attempted to call x2, will re-attempt to gain for further collateral and discuss further dispo planning.    Collateral, conducted by Child psychotherapist, 11/08/2023  [facility] INPATIENT:  Family/Significant Other Suicide Prevention Education   Suicide Prevention Education:  Education Completed; Kipp Shank (mom / legal guardian) [phone],  (name of family member/significant other) has been identified by the patient as the family member/significant other with whom the  patient will be residing, and identified as the person(s) who will aid the patient in the event of a mental health crisis (suicidal ideations/suicide attempt).  With written consent from the patient, the family member/significant other has been provided the following suicide prevention education, prior to the and/or following the discharge of the patient.   Mom said that she is patient's legal guardian and patient's sister, Karo Rog, will drop off the paperwork.  Mom said someone will be available to pick up patient upon discharge.   Mom said that until recently patient was staying with his sister in [place], but experienced symptoms, used marijuana and was on streets, so he couldn't stay with her any longer, and became homeless.  Mom said she is in the process of applying for social security benefits for patient.     Mom said that she lives in [place], [place] (2 hours away).  She lives with someone, so patient can't stay with her.   Mom said patient doesn't have any guns or weapons.     Mom said that patient was using marijuana, and walked and walked for so long that he developed blisters on his feet.   Mom wasn't sure if patient is receiving ACTT services ([facility]), or has other providers.  She believes he has Medicaid.  Mom will speak with her daughter (patient's sister), and will call CSW with this information.    Principal Problem: Schizophrenia (HCC) Diagnosis: Principal Problem:   Schizophrenia (HCC) Active Problems:   Episodic cannabis use  Total Time spent with patient: 30 minutes  Past Psychiatric History:   Previous Psych Diagnoses: schizophrenia, unspecific schizophrenia spectrum disorder, substance induced psychosis, history of cannabis use Prior inpatient treatment: Multiple, 03/2019, 04/2019, 04/2019, 02/2020, 12/2020 and per chart review last hospitalization  was with old [facility] in 2023 Current/prior outpatient treatment: Yes, not currently following with  psychiatry Prior rehab hx: Unable to assess due to disorganization Psychotherapy hx: Unable to assess due to disorganization History of suicide: Unable to assess due to disorganization History of homicide or aggression: Unable to assess due to disorganization Psychiatric medication history: Per chart review Invega , Risperdal , Depakote , trazodone , hydroxyzine , benztropine , Haldol  Psychiatric medication compliance history: Noncompliance Neuromodulation history: Unaware Current Psychiatrist: Denies Current therapist: Denies  Past Medical History:  Past Medical History:  Diagnosis Date   Anemia    Sickle cell trait (HCC)     Past Surgical History:  Procedure Laterality Date   CIRCUMCISION  2001   LAPAROTOMY N/A 08/23/2019   Procedure: EXPLORATORY LAPAROTOMY;  Surgeon: Sebastian Moles, MD;  Location: [facility] OR;  Service: General;  Laterality: N/A;   Family History: History reviewed. No pertinent family history. Family Psychiatric  History:  Psych:depression on mother side,  bipolar and schizophrenia in paternal great grandparents per EMR  Psych Mk:Ejupzwu unable to disclose due to disorganization SA/HA:Patient unable to disclose due to disorganization Substance use family yk:Ejupzwu unable to disclose due to disorganization  Social History:  Social History   Substance and Sexual Activity  Alcohol Use Yes     Social History   Substance and Sexual Activity  Drug Use Yes   Types: Methamphetamines, Marijuana   Comment: Daily    Social History   Socioeconomic History   Marital status: Single    Spouse name: Not on file   Number of children: Not on file   Years of education: Not on file   Highest education level: Not on file  Occupational History   Not on file  Tobacco Use   Smoking status: Some Days    Current packs/day: 0.25    Average packs/day: 0.3 packs/day for 4.0 years (1.0 ttl pk-yrs)    Types: Cigars, Cigarettes   Smokeless tobacco: Never  Vaping Use   Vaping  status: Never Used  Substance and Sexual Activity   Alcohol use: Yes   Drug use: Yes    Types: Methamphetamines, Marijuana    Comment: Daily   Sexual activity: Never  Other Topics Concern   Not on file  Social History Narrative   Not on file   Social Drivers of Health   Financial Resource Strain: Not on file  Food Insecurity: Food Insecurity Present (11/06/2023)   Hunger Vital Sign    Worried About Running Out of Food in the Last Year: Often true    Ran Out of Food in the Last Year: Often true  Transportation Needs: Unmet Transportation Needs (11/06/2023)   PRAPARE - Administrator, Civil Service (Medical): Yes    Lack of Transportation (Non-Medical): Yes  Physical Activity: Not on file  Stress: Not on file  Social Connections: Not on file   Additional Social History:                         Sleep: Good Estimated Sleeping Duration (Last 24 Hours): 6.75-7.50 hours  Appetite:  Good  Current Medications: Current Facility-Administered Medications  Medication Dose Route Frequency Provider Last Rate Last Admin   acetaminophen  (TYLENOL ) tablet 650 mg  650 mg Oral Q6H PRN Coleman, Carolyn H, NP       alum & mag hydroxide-simeth (MAALOX/MYLANTA) 200-200-20 MG/5ML suspension 30 mL  30 mL Oral Q4H PRN Mardy Elveria DEL, NP       feeding supplement (ENSURE  PLUS HIGH PROTEIN) liquid 237 mL  237 mL Oral BID BM Butler, Laura N, MD   237 mL at 11/10/23 9093   hydrOXYzine  (ATARAX ) tablet 25 mg  25 mg Oral TID PRN Coleman, Carolyn H, NP   25 mg at 11/10/23 9156   magnesium  hydroxide (MILK OF MAGNESIA) suspension 30 mL  30 mL Oral Daily PRN Coleman, Carolyn H, NP       OLANZapine  (ZYPREXA ) injection 10 mg  10 mg Intramuscular TID PRN Lenard Calin, MD       OLANZapine  (ZYPREXA ) injection 5 mg  5 mg Intramuscular TID PRN Lenard Calin, MD       OLANZapine  zydis (ZYPREXA ) disintegrating tablet 5 mg  5 mg Oral TID PRN Lenard Calin, MD       risperiDONE  (RISPERDAL   M-TABS) disintegrating tablet 2 mg  2 mg Oral BID Lenard Calin, MD   2 mg at 11/10/23 1615   traZODone  (DESYREL ) tablet 50 mg  50 mg Oral QHS PRN Coleman, Carolyn H, NP   50 mg at 11/06/23 2047    Lab Results:  No results found for this or any previous visit (from the past 48 hours).   Blood Alcohol level:  Lab Results  Component Value Date   [facility] <15 11/05/2023   ETH <10 08/14/2021    Metabolic Disorder Labs: Lab Results  Component Value Date   HGBA1C 4.5 (L) 11/08/2023   MPG 82.45 11/08/2023   MPG 79.58 08/14/2021   Lab Results  Component Value Date   PROLACTIN 15.2 04/21/2019   Lab Results  Component Value Date   CHOL 120 11/08/2023   TRIG 56 11/08/2023   HDL 56 11/08/2023   CHOLHDL 2.1 11/08/2023   VLDL 11 11/08/2023   LDLCALC 53 11/08/2023   LDLCALC 90 01/01/2021    Physical Findings: AIMS:  ,  ,  ,  ,  ,  ,   CIWA:    COWS:     Musculoskeletal: Strength & Muscle Tone: within normal limits Gait & Station: normal Patient leans: N/A  Psychiatric Specialty Exam:  Presentation  General Appearance:  Casual; Bizarre  Eye Contact: Fair  Speech: Clear and Coherent; Normal Rate  Speech Volume: Normal  Handedness: Right   Mood and Affect  Mood: Euthymic  Affect: Full Range; Appropriate   Thought Process  Thought Processes: Other (comment); Coherent (Less disorganized during interview this morning,)  Descriptions of Associations:Circumstantial  Orientation:Partial  Thought Content:Illogical  History of Schizophrenia/Schizoaffective disorder:Yes  Duration of Psychotic Symptoms:Greater than six months  Hallucinations:Hallucinations: Other (comment) (RIS)  Ideas of Reference:None  Suicidal Thoughts:Suicidal Thoughts: No  Homicidal Thoughts:Homicidal Thoughts: No   Sensorium  Memory: Immediate Fair; Remote Poor  Judgment: Poor  Insight: Shallow   Executive Functions  Concentration: Poor  Attention  Span: Poor  Recall: Poor  Fund of Knowledge: Fair  Language: Fair   Psychomotor Activity  Psychomotor Activity: Psychomotor Activity: Normal  Assets  Assets: Physical Health   Sleep  Sleep: Number of Hours of Sleep: 7.75    Physical Exam: Physical Exam Constitutional:      General: He is not in acute distress.    Appearance: He is not toxic-appearing or diaphoretic.  Eyes:     Conjunctiva/sclera: Conjunctivae normal.  Pulmonary:     Effort: Pulmonary effort is normal.  Musculoskeletal:        General: Normal range of motion.  Neurological:     General: No focal deficit present.     Mental Status: He is  alert.    Review of Systems  Constitutional:  Negative for chills, diaphoresis and fever.  Respiratory:  Negative for cough.   Gastrointestinal:  Negative for nausea and vomiting.  Neurological:  Negative for headaches.  Psychiatric/Behavioral:  Negative for depression, hallucinations, substance abuse and suicidal ideas. The patient is not nervous/anxious and does not have insomnia.    Blood pressure 118/75, pulse 86, temperature 98.1 F (36.7 C), resp. rate (!) 22, height 5' 10 (1.778 m), weight 55.3 kg, SpO2 100%. Body mass index is 17.51 kg/m.  Treatment Plan Summary: Latwan Luchsinger is a 23 y.o. male with a past psychiatric history of schizophrenia, unspecific schizophrenia spectrum disorder, substance induced psychosis, history of cannabis use who presents involuntarily to [facility] from [facility] emergency department due to thought blocking, paranoia, disorganization and erratic behavior in the setting of medication noncompliance.  He was transferred here for symptom stabilization and medication management.   On intake assessment, patient does appear to be psychotic given disorganization, responding to internal stimuli on the floor throughout interview and lack of insight of events leading up to hospitalization.  Patient is calm and cooperative  throughout interview and has not required any agitation protocol.  Patient reports that he was previously on risperidone  help control symptoms.  However per the pharmacy dispensed report patient has not recently had risperidone  filled (no record of dispenses since 04/2023). Patient has been hospitalized here multiple times before and seemed to do well on risperidone .  Will restart a low dose and continue to adjust to optimize symptom stabilization and hopes of transitioning to [facility].  We will call mother for further collateral after given patient's consent during intake assessment.  8/24:  On morning assessment, patients disorganization is improving, still has moments of being bizarre, is cooperative approachable and attempts to answer providers questions.  Will maintain Risperdal  dosage today and patient amenable with [facility] prior to discharge. Per social work documentation someone can pick up the patient when ready for discharge.    Diagnoses / Active Problems: Schizophrenia  Cannabis Use (per UDS)    PLAN: Safety and Monitoring:             --  Involuntary admission to inpatient psychiatric unit for safety, stabilization and treatment             -- Daily contact with patient to assess and evaluate symptoms and progress in treatment             -- Patient's case to be discussed in multi-disciplinary team meeting             -- Observation Level : q15 minute checks             -- Vital signs:  q12 hours             -- Precautions: suicide, elopement, and assault   2. Psychiatric Diagnoses and Treatment: --Continue Risperdal  2 mg twice daily for psychosis, we will consider increasing dose to 2 mg qAM and 3 mg at bedtime symptoms not well controlled, patient amenable with long-acting injectable prior to discharge the patient --As needed agitation protocol ordered, Zyprexa  ordered and discontinued Haldol  given patient prior history of acute dystonic reaction with Haldol  -- PRNs available: Detailed in  the [facility] --  The risks/benefits/side-effects/alternatives to this medication were discussed in detail with the patient and time was given for questions. The patient consents to medication trial.              --  Metabolic profile and EKG monitoring obtained while on an atypical antipsychotic (BMI: Lipid Panel: HbgA1c: QTc:)             -- Labs unremarkable              -- Encouraged patient to participate in unit milieu and in scheduled group therapies              -- Short Term Goals: Ability to identify changes in lifestyle to reduce recurrence of condition will improve, Ability to maintain clinical measurements within normal limits will improve, Compliance with prescribed medications will improve, and Ability to identify triggers associated with substance abuse/mental health issues will improve             -- Long Term Goals: Improvement in symptoms so as ready for discharge   3. Medical Issues Being Addressed:              Cannabis Use              -- UDS positive for THC              -- Smoking cessation encouraged   4. Discharge Planning:              -- Social work and case management to assist with discharge planning and identification of hospital follow-up needs prior to discharge             -- Estimated LOS: 5-7 days             -- Discharge Concerns:Medication compliance and effectiveness             -- Discharge Goals: Return home with outpatient referrals for mental health follow-up including medication management/psychotherapy   I certify that inpatient services furnished can reasonably be expected to improve the patient's condition.    PATTI OLDEN, MD 11/11/2023, 8:21 AM

## 2023-11-12 ENCOUNTER — Encounter (HOSPITAL_COMMUNITY): Payer: Self-pay

## 2023-11-12 DIAGNOSIS — F201 Disorganized schizophrenia: Secondary | ICD-10-CM

## 2023-11-12 MED ORDER — PALIPERIDONE PALMITATE ER 234 MG/1.5ML IM SUSY
234.0000 mg | PREFILLED_SYRINGE | Freq: Once | INTRAMUSCULAR | Status: AC
  Administered 2023-11-12: 234 mg via INTRAMUSCULAR
  Filled 2023-11-12: qty 1.5

## 2023-11-12 NOTE — Progress Notes (Signed)
 [facility] MD Progress Note  11/12/2023 11:27 AM Garrett Bolton  MRN:  [id number] Subjective:   Garrett Bolton is a 23 y.o. male with a past psychiatric history of schizophrenia, unspecific schizophrenia spectrum disorder, substance induced psychosis, history of cannabis use who presents involuntarily to [facility] from [facility] emergency department due to thought blocking, paranoia, disorganization and erratic behavior in the setting of medication noncompliance.  He was transferred here for symptom stabilization and medication management. Denies issues with sleep or appetite.   24 hour events:  (Sleep Hours) - 7.75 (Any PRNs that were needed, meds refused, or side effects to meds)- No PRN meds given, no meds refused.  (Any disturbances and when (visitation, over night)- None  (Concerns raised by the patient)- None  (SI/HI/AVH)- Denies SI/HI/AVH  Patient interview:   On morning assessment, patient more clear and linear this morning. Not observed responding to internal stimuli during interview. Denies depression and anxiety. Patient amenable with LAI and thought he may be getting it yesterday, still desires getting injectable. Denies any side effects. Patient denies suicidal ideations, homicidal ideations, auditory or visual hallucinations.  Patient reports that he did talk to mom this morning and states she plans to visit him on Tuesday.   Collateral, Mom, [phone], attempted at 1006 on 8/25 Attempted to call x1, No answer and went to voice mail.   Collateral, [name] Ahumada, [phone], 1400 8/24 Attempted to call x2, will re-attempt to gain for further collateral and discuss further dispo planning.    Collateral, conducted by Child psychotherapist, 11/08/2023  [facility] INPATIENT:  Family/Significant Other Suicide Prevention Education   Suicide Prevention Education:  Education Completed; Juanantonio Stolar (mom / legal guardian) [phone],  (name of family member/significant other) has been  identified by the patient as the family member/significant other with whom the patient will be residing, and identified as the person(s) who will aid the patient in the event of a mental health crisis (suicidal ideations/suicide attempt).  With written consent from the patient, the family member/significant other has been provided the following suicide prevention education, prior to the and/or following the discharge of the patient.   Mom said that she is patient's legal guardian and patient's sister, Rufus Beske, will drop off the paperwork.  Mom said someone will be available to pick up patient upon discharge.   Mom said that until recently patient was staying with his sister in [place], but experienced symptoms, used marijuana and was on streets, so he couldn't stay with her any longer, and became homeless.  Mom said she is in the process of applying for social security benefits for patient.     Mom said that she lives in [place], [place] (2 hours away).  She lives with someone, so patient can't stay with her.   Mom said patient doesn't have any guns or weapons.     Mom said that patient was using marijuana, and walked and walked for so long that he developed blisters on his feet.   Mom wasn't sure if patient is receiving ACTT services ([facility]), or has other providers.  She believes he has Medicaid.  Mom will speak with her daughter (patient's sister), and will call CSW with this information.    Principal Problem: Schizophrenia (HCC) Diagnosis: Principal Problem:   Schizophrenia (HCC) Active Problems:   Episodic cannabis use  Total Time spent with patient: 30 minutes  Past Psychiatric History:   Previous Psych Diagnoses: schizophrenia, unspecific schizophrenia spectrum disorder, substance induced psychosis, history of cannabis  use Prior inpatient treatment: Multiple, 03/2019, 04/2019, 04/2019, 02/2020, 12/2020 and per chart review last hospitalization was with old [facility] in  2023 Current/prior outpatient treatment: Yes, not currently following with psychiatry Prior rehab hx: Unable to assess due to disorganization Psychotherapy hx: Unable to assess due to disorganization History of suicide: Unable to assess due to disorganization History of homicide or aggression: Unable to assess due to disorganization Psychiatric medication history: Per chart review Invega , Risperdal , Depakote , trazodone , hydroxyzine , benztropine , Haldol  Psychiatric medication compliance history: Noncompliance Neuromodulation history: Unaware Current Psychiatrist: Denies Current therapist: Denies  Past Medical History:  Past Medical History:  Diagnosis Date   Anemia    Sickle cell trait (HCC)     Past Surgical History:  Procedure Laterality Date   CIRCUMCISION  2001   LAPAROTOMY N/A 08/23/2019   Procedure: EXPLORATORY LAPAROTOMY;  Surgeon: Sebastian Moles, MD;  [facility]: [facility] OR;  Service: General;  Laterality: N/A;   Family History: History reviewed. No pertinent family history. Family Psychiatric  History:  Psych:depression on mother side,  bipolar and schizophrenia in paternal great grandparents per EMR  Psych Mk:Ejupzwu unable to disclose due to disorganization SA/HA:Patient unable to disclose due to disorganization Substance use family yk:Ejupzwu unable to disclose due to disorganization  Social History:  Social History   Substance and Sexual Activity  Alcohol Use Yes     Social History   Substance and Sexual Activity  Drug Use Yes   Types: Methamphetamines, Marijuana   Comment: Daily    Social History   Socioeconomic History   Marital status: Single    Spouse name: Not on file   Number of children: Not on file   Years of education: Not on file   Highest education level: Not on file  Occupational History   Not on file  Tobacco Use   Smoking status: Some Days    Current packs/day: 0.25    Average packs/day: 0.3 packs/day for 4.0 years (1.0 ttl pk-yrs)    Types:  Cigars, Cigarettes   Smokeless tobacco: Never  Vaping Use   Vaping status: Never Used  Substance and Sexual Activity   Alcohol use: Yes   Drug use: Yes    Types: Methamphetamines, Marijuana    Comment: Daily   Sexual activity: Never  Other Topics Concern   Not on file  Social History Narrative   Not on file   Social Drivers of Health   Financial Resource Strain: Not on file  Food Insecurity: Food Insecurity Present (11/06/2023)   Hunger Vital Sign    Worried About Running Out of Food in the Last Year: Often true    Ran Out of Food in the Last Year: Often true  Transportation Needs: Unmet Transportation Needs (11/06/2023)   PRAPARE - Administrator, Civil Service (Medical): Yes    Lack of Transportation (Non-Medical): Yes  Physical Activity: Not on file  Stress: Not on file  Social Connections: Not on file   Additional Social History:                         Sleep: Good Estimated Sleeping Duration (Last 24 Hours): 7.50-8.75 hours  Appetite:  Good  Current Medications: Current Facility-Administered Medications  Medication Dose Route Frequency Provider Last Rate Last Admin   acetaminophen  (TYLENOL ) tablet 650 mg  650 mg Oral Q6H PRN Coleman, Carolyn H, NP       alum & mag hydroxide-simeth (MAALOX/MYLANTA) 200-200-20 MG/5ML suspension 30 mL  30 mL  Oral Q4H PRN Mardy Elveria DEL, NP       feeding supplement (ENSURE PLUS HIGH PROTEIN) liquid 237 mL  237 mL Oral BID BM Butler, Laura N, MD   237 mL at 11/12/23 9045   hydrOXYzine  (ATARAX ) tablet 25 mg  25 mg Oral TID PRN Coleman, Carolyn H, NP   25 mg at 11/11/23 2000   magnesium  hydroxide (MILK OF MAGNESIA) suspension 30 mL  30 mL Oral Daily PRN Coleman, Carolyn H, NP       OLANZapine  (ZYPREXA ) injection 10 mg  10 mg Intramuscular TID PRN Lenard Calin, MD       OLANZapine  (ZYPREXA ) injection 5 mg  5 mg Intramuscular TID PRN Lenard Calin, MD       OLANZapine  zydis (ZYPREXA ) disintegrating tablet 5 mg   5 mg Oral TID PRN Lenard Calin, MD       paliperidone  (INVEGA  SUSTENNA) injection 234 mg  234 mg Intramuscular Once Lenard Calin, MD       risperiDONE  (RISPERDAL  M-TABS) disintegrating tablet 2 mg  2 mg Oral BID Lenard Calin, MD   2 mg at 11/12/23 9195   traZODone  (DESYREL ) tablet 50 mg  50 mg Oral QHS PRN Coleman, Carolyn H, NP   50 mg at 11/11/23 2000    Lab Results:  No results found for this or any previous visit (from the past 48 hours).   Blood Alcohol level:  Lab Results  Component Value Date   [facility] <15 11/05/2023   ETH <10 08/14/2021    Metabolic Disorder Labs: Lab Results  Component Value Date   HGBA1C 4.5 (L) 11/08/2023   MPG 82.45 11/08/2023   MPG 79.58 08/14/2021   Lab Results  Component Value Date   PROLACTIN 15.2 04/21/2019   Lab Results  Component Value Date   CHOL 120 11/08/2023   TRIG 56 11/08/2023   HDL 56 11/08/2023   CHOLHDL 2.1 11/08/2023   VLDL 11 11/08/2023   LDLCALC 53 11/08/2023   LDLCALC 90 01/01/2021    Physical Findings: AIMS:  ,  ,  ,  ,  ,  ,   CIWA:    COWS:     Musculoskeletal: Strength & Muscle Tone: within normal limits Gait & Station: normal Patient leans: N/A  Psychiatric Specialty Exam:  Presentation  General Appearance:  Appropriate for Environment; Casual  Eye Contact: Fair  Speech: Clear and Coherent; Normal Rate  Speech Volume: Normal  Handedness: Right   Mood and Affect  Mood: Euthymic  Affect: Appropriate; Congruent   Thought Process  Thought Processes: Linear; Coherent  Descriptions of Associations:Intact  Orientation:Full (Time, Place and Person)  Thought Content:WDL  History of Schizophrenia/Schizoaffective disorder:Yes  Duration of Psychotic Symptoms:Greater than six months  Hallucinations:Hallucinations: None   Ideas of Reference:None  Suicidal Thoughts:Suicidal Thoughts: No   Homicidal Thoughts:Homicidal Thoughts: No    Sensorium  Memory: Immediate  Fair  Judgment: Fair  Insight: Fair   Art therapist  Concentration: Fair  Attention Span: Fair  Recall: [employer] of Knowledge: Fair  Language: Fair   Psychomotor Activity  Psychomotor Activity: Psychomotor Activity: Normal   Assets  Assets: Physical Health; Desire for Improvement   Sleep  Sleep: Sleep: Good Number of Hours of Sleep: 9.25     Physical Exam: Physical Exam Constitutional:      General: He is not in acute distress.    Appearance: He is not toxic-appearing or diaphoretic.  Eyes:     Conjunctiva/sclera: Conjunctivae normal.  Pulmonary:  Effort: Pulmonary effort is normal.  Musculoskeletal:        General: Normal range of motion.  Neurological:     General: No focal deficit present.     Mental Status: He is alert.    Review of Systems  Constitutional:  Negative for chills, diaphoresis and fever.  Respiratory:  Negative for cough.   Gastrointestinal:  Negative for nausea and vomiting.  Neurological:  Negative for headaches.  Psychiatric/Behavioral:  Negative for depression, hallucinations, substance abuse and suicidal ideas. The patient is not nervous/anxious and does not have insomnia.    Blood pressure 112/65, pulse 87, temperature 97.8 F (36.6 C), resp. rate (!) 22, height 5' 10 (1.778 m), weight 55.3 kg, SpO2 99%. Body mass index is 17.51 kg/m.  Treatment Plan Summary: Vint Pola is a 23 y.o. male with a past psychiatric history of schizophrenia, unspecific schizophrenia spectrum disorder, substance induced psychosis, history of cannabis use who presents involuntarily to [facility] from [facility] emergency department due to thought blocking, paranoia, disorganization and erratic behavior in the setting of medication noncompliance.  He was transferred here for symptom stabilization and medication management.   On intake assessment, patient does appear to be psychotic given disorganization, responding to internal  stimuli on the floor throughout interview and lack of insight of events leading up to hospitalization.  Patient is calm and cooperative throughout interview and has not required any agitation protocol.  Patient reports that he was previously on risperidone  help control symptoms.  However per the pharmacy dispensed report patient has not recently had risperidone  filled (no record of dispenses since 04/2023). Patient has been hospitalized here multiple times before and seemed to do well on risperidone .  Will restart a low dose and continue to adjust to optimize symptom stabilization and hopes of transitioning to LAI.  We will call mother for further collateral after given patient's consent during intake assessment.  8/24:  On morning assessment, patient more clear and organized with thought process. Will maintain Risperdal  dosage today and give loading dose of Invega  today and additional dose Thursday. Can consider discharge Thursday if continues to be stable. Per social work documentation someone can pick up the patient when ready for discharge. Will attempt to speak with mom, patient states she will visit Tuesday.    Diagnoses / Active Problems: Schizophrenia  Cannabis Use (per UDS)    PLAN: Safety and Monitoring:             --  Involuntary admission to inpatient psychiatric unit for safety, stabilization and treatment             -- Daily contact with patient to assess and evaluate symptoms and progress in treatment             -- Patient's case to be discussed in multi-disciplinary team meeting             -- Observation Level : q15 minute checks             -- Vital signs:  q12 hours             -- Precautions: suicide, elopement, and assault   2. Psychiatric Diagnoses and Treatment: --Continue Risperdal  2 mg twice daily for psychosis,  -- Administer Invega  Sustena 234 mg dose today, and 156 mg 8/28 --As needed agitation protocol ordered, Zyprexa  ordered and discontinued Haldol  given patient  prior history of acute dystonic reaction with Haldol  -- PRNs available: Detailed in the [facility] --  The risks/benefits/side-effects/alternatives to  this medication were discussed in detail with the patient and time was given for questions. The patient consents to medication trial.              -- Metabolic profile and EKG monitoring obtained while on an atypical antipsychotic (BMI: Lipid Panel: HbgA1c: QTc:)             -- Labs unremarkable              -- Encouraged patient to participate in unit milieu and in scheduled group therapies              -- Short Term Goals: Ability to identify changes in lifestyle to reduce recurrence of condition will improve, Ability to maintain clinical measurements within normal limits will improve, Compliance with prescribed medications will improve, and Ability to identify triggers associated with substance abuse/mental health issues will improve             -- Long Term Goals: Improvement in symptoms so as ready for discharge   3. Medical Issues Being Addressed:              Cannabis Use              -- UDS positive for THC              -- Smoking cessation encouraged   4. Discharge Planning:              -- Social work and case management to assist with discharge planning and identification of hospital follow-up needs prior to discharge             -- Estimated LOS: 5-7 days, possible Thursday after LAI              -- Discharge Concerns:Medication compliance and effectiveness             -- Discharge Goals: Return home with outpatient referrals for mental health follow-up including medication management/psychotherapy   I certify that inpatient services furnished can reasonably be expected to improve the patient's condition.    PATTI OLDEN, MD 11/12/2023, 11:27 AM

## 2023-11-12 NOTE — Progress Notes (Signed)
   11/12/23 0804  Psych Admission Type (Psych Patients Only)  Admission Status Involuntary  Psychosocial Assessment  Patient Complaints None  Eye Contact Fair  Facial Expression Flat  Affect Appropriate to circumstance  Speech Logical/coherent  Interaction Childlike  Motor Activity Other (Comment) (WDL)  Appearance/Hygiene Unremarkable  Behavior Characteristics Cooperative;Calm  Mood Pleasant  Thought Process  Coherency WDL  Content WDL  Delusions None reported or observed  Perception WDL  Hallucination None reported or observed  Judgment Poor  Confusion None  Danger to Self  Current suicidal ideation? Denies  Agreement Not to Harm Self Yes  Description of Agreement verbal  Danger to Others  Danger to Others None reported or observed

## 2023-11-12 NOTE — Group Note (Unsigned)
 Date:  11/12/2023 Time:  9:52 AM  Group Topic/Focus:  Goals Group:   The focus of this group is to help patients establish daily goals to achieve during treatment and discuss how the patient can incorporate goal setting into their daily lives to aide in recovery. Orientation:   The focus of this group is to educate the patient on the purpose and policies of crisis stabilization and provide a format to answer questions about their admission.  The group details unit policies and expectations of patients while admitted.     Participation Level:  {[facility] PARTICIPATION OZCZO:77735}  Participation Quality:  {[facility] PARTICIPATION QUALITY:22265}  Affect:  {[facility] AFFECT:22266}  Cognitive:  {[facility] COGNITIVE:22267}  Insight: {[facility] Insight2:20797}  Engagement in Group:  {[facility] ENGAGEMENT IN HMNLE:77731}  Modes of Intervention:  {[facility] MODES OF INTERVENTION:22269}  Additional Comments:  ***  Adriana GORMAN Blush 11/12/2023, 9:52 AM

## 2023-11-12 NOTE — Plan of Care (Signed)
   Problem: Education: Goal: Emotional status will improve Outcome: Progressing Goal: Mental status will improve Outcome: Progressing Goal: Verbalization of understanding the information provided will improve Outcome: Progressing   Problem: Activity: Goal: Interest or engagement in activities will improve Outcome: Progressing

## 2023-11-12 NOTE — Group Note (Signed)
 LCSW Group Therapy Note   Group Date: 11/12/2023 Start Time: 1300 End Time: 1400   Participation:  attended.  Patient was respectful and listened but didn't participate in the discussion.  Type of Therapy:  Group Therapy  Topic:  Understanding Your Path to Change  Objective:  The goal is to help individuals understand the stages of change, identify where they currently are in the process, and provide actionable next steps to continue moving forward in their journey of change.  Goals: Learn about the six stages of change:  Precontemplation, Contemplation, Preparation, Action, Maintenance, and Relapse Reflect on Current Change Efforts:  Recognize which stage participants are in regarding a personal change. Plan Next Steps for Moving Forward:  Create an action plan based on their current stage of change.  Class Summary:  In this session, we explored the Stages of Change as a framework to understand the process of change.  We discussed how each stage helps individuals recognize where they are in their personal journey and used the Stages of Change Worksheet for self-reflection. Participants answered questions to better understand their current stage, challenges, and progress. We also emphasized the importance of moving forward, even if setbacks (Relapse) occur, and created actionable steps to help participants continue progressing. By the end of the session, participants gained a clearer understanding of their path to change and left with a clear plan for next steps.  Modalities:  Elements of CBT (cognitive restructuring, problem solving)  Element of DBT (mindfulness, distress tolerance)   Jani Ploeger O Peggy Loge, LCSWA 11/12/2023  6:03 PM

## 2023-11-12 NOTE — Progress Notes (Incomplete)
(  Sleep Hours) -7.75  (Any PRNs that were needed, meds refused, or side effects to meds)- N/A  (Any disturbances and when (visitation, over night)-N/A  (Concerns raised by the patient)- N/A  (SI/HI/AVH)-denies

## 2023-11-12 NOTE — Progress Notes (Signed)
 Collateral contact - Estill Llerena (mom / legal guardian) [phone]   CSW left a voicemail for mom.   Shanikqua Zarzycki, LCSWA 11/12/2023

## 2023-11-12 NOTE — Plan of Care (Signed)
°  Problem: Education: °Goal: Emotional status will improve °Outcome: Progressing °  °Problem: Activity: °Goal: Sleeping patterns will improve °Outcome: Progressing °  °Problem: Safety: °Goal: Periods of time without injury will increase °Outcome: Progressing °  °

## 2023-11-12 NOTE — Group Note (Signed)
 Recreation Therapy Group Note   Group Topic:Coping Skills  Group Date: 11/12/2023 Start Time: 1015 End Time: 1045 Facilitators: Carnel Stegman-McCall, LRT,CTRS Location: [address] Dayroom   Group Topic: Coping Skills   Goal Area(s) Addresses: Patient will define what a coping skill is. Patient will work to create a list of healthy coping skills beginning with each letter of the alphabet. Patient will successfully identify positive coping skills they can use post d/c.  Patient will acknowledge benefit(s) of using learned coping skills post d/c.   Behavioral Response: Unable to focus   Intervention: Group work   Activity: Coping A to Z. Patient asked to identify what a coping skill is and when they use them. Patients with Clinical research associate discussed healthy versus unhealthy coping skills. Next patients were given a blank worksheet titled Coping Skills A-Z. Patients were instructed to come up with at least one positive coping skill per letter of the alphabet, addressing a specific challenge (ex: stress, anger, anxiety, depression, grief, doubt, isolation, self-harm/suicidal thoughts, substance use). Patients were given 15 minutes to brainstorm before ideas were presented to the large group. Patients and LRT debriefed on the importance of coping skill selection based on situation and back-up plans when a skill tried is not effective. At the end of group, patients were given an handout of alphabetized strategies to keep for future reference.   Education: Pharmacologist, Scientist, physiological, Discharge Planning.    Education Outcome: Acknowledges education/Verbalizes understanding/In group clarification offered/Additional education needed   Affect/Mood: Appropriate   Participation Level: Minimal   Participation Quality: Independent   Behavior: Distracted   Speech/Thought Process: Distracted   Insight: None   Judgement: None   Modes of Intervention: Music and Worksheet   Patient Response to  Interventions:  Disengaged   Education Outcome:  In group clarification offered    Clinical Observations/Individualized Feedback: Pt was to come up with coping skills for fear. Pt was unable to complete the assignment. Pt was distracted and couldn't focus on the task at hand. Pt would drift off into his own thoughts.     Plan: Continue to engage patient in RT group sessions 2-3x/week.   Darrick Greenlaw-McCall, LRT,CTRS 11/12/2023 1:13 PM

## 2023-11-12 NOTE — BH IP Treatment Plan (Signed)
 Interdisciplinary Treatment and Diagnostic Plan Update  11/12/2023 Time of Session: 12:20 PM - UPDATE Garrett Bolton MRN: [id number]  Principal Diagnosis: Schizophrenia [facility])  Secondary Diagnoses: Principal Problem:   Schizophrenia (HCC) Active Problems:   Episodic cannabis use   Current Medications:  Current Facility-Administered Medications  Medication Dose Route Frequency Provider Last Rate Last Admin   acetaminophen  (TYLENOL ) tablet 650 mg  650 mg Oral Q6H PRN Coleman, Carolyn H, NP       alum & mag hydroxide-simeth (MAALOX/MYLANTA) 200-200-20 MG/5ML suspension 30 mL  30 mL Oral Q4H PRN Coleman, Carolyn H, NP       feeding supplement (ENSURE PLUS HIGH PROTEIN) liquid 237 mL  237 mL Oral BID BM Towana Leita SAILOR, MD   237 mL at 11/12/23 9045   hydrOXYzine  (ATARAX ) tablet 25 mg  25 mg Oral TID PRN Coleman, Carolyn H, NP   25 mg at 11/11/23 2000   magnesium  hydroxide (MILK OF MAGNESIA) suspension 30 mL  30 mL Oral Daily PRN Mardy Elveria DEL, NP       OLANZapine  (ZYPREXA ) injection 10 mg  10 mg Intramuscular TID PRN Lenard Calin, MD       OLANZapine  (ZYPREXA ) injection 5 mg  5 mg Intramuscular TID PRN Lenard Calin, MD       OLANZapine  zydis (ZYPREXA ) disintegrating tablet 5 mg  5 mg Oral TID PRN Lenard Calin, MD       risperiDONE  (RISPERDAL  M-TABS) disintegrating tablet 2 mg  2 mg Oral BID Lenard Calin, MD   2 mg at 11/12/23 1637   traZODone  (DESYREL ) tablet 50 mg  50 mg Oral QHS PRN Coleman, Carolyn H, NP   50 mg at 11/11/23 2000   PTA Medications: No medications prior to admission.    Patient Stressors: Financial difficulties   Medication change or noncompliance   Substance abuse    Patient Strengths: Physical Health  Supportive family/friends   Treatment Modalities: Medication Management, Group therapy, Case management,  1 to 1 session with clinician, Psychoeducation, Recreational therapy.   Physician Treatment Plan for Primary Diagnosis: Schizophrenia  (HCC) Long Term Goal(s): Improvement in symptoms so as ready for discharge   Short Term Goals: Ability to identify changes in lifestyle to reduce recurrence of condition will improve Ability to maintain clinical measurements within normal limits will improve Compliance with prescribed medications will improve Ability to identify triggers associated with substance abuse/mental health issues will improve  Medication Management: Evaluate patient's response, side effects, and tolerance of medication regimen.  Therapeutic Interventions: 1 to 1 sessions, Unit Group sessions and Medication administration.  Evaluation of Outcomes: Progressing  Physician Treatment Plan for Secondary Diagnosis: Principal Problem:   Schizophrenia (HCC) Active Problems:   Episodic cannabis use  Long Term Goal(s): Improvement in symptoms so as ready for discharge   Short Term Goals: Ability to identify changes in lifestyle to reduce recurrence of condition will improve Ability to maintain clinical measurements within normal limits will improve Compliance with prescribed medications will improve Ability to identify triggers associated with substance abuse/mental health issues will improve     Medication Management: Evaluate patient's response, side effects, and tolerance of medication regimen.  Therapeutic Interventions: 1 to 1 sessions, Unit Group sessions and Medication administration.  Evaluation of Outcomes: Progressing   RN Treatment Plan for Primary Diagnosis: Schizophrenia (HCC) Long Term Goal(s): Knowledge of disease and therapeutic regimen to maintain health will improve  Short Term Goals: Ability to remain free from injury will improve, Ability to verbalize frustration and  anger appropriately will improve, Ability to verbalize feelings will improve, and Ability to disclose and discuss suicidal ideas  Medication Management: RN will administer medications as ordered by provider, will assess and  evaluate patient's response and provide education to patient for prescribed medication. RN will report any adverse and/or side effects to prescribing provider.  Therapeutic Interventions: 1 on 1 counseling sessions, Psychoeducation, Medication administration, Evaluate responses to treatment, Monitor vital signs and CBGs as ordered, Perform/monitor CIWA, COWS, AIMS and Fall Risk screenings as ordered, Perform wound care treatments as ordered.  Evaluation of Outcomes: Progressing   LCSW Treatment Plan for Primary Diagnosis: Schizophrenia (HCC) Long Term Goal(s): Safe transition to appropriate next level of care at discharge, Engage patient in therapeutic group addressing interpersonal concerns.  Short Term Goals: Engage patient in aftercare planning with referrals and resources, Increase ability to appropriately verbalize feelings, Facilitate acceptance of mental health diagnosis and concerns, and Identify triggers associated with mental health/substance abuse issues  Therapeutic Interventions: Assess for all discharge needs, 1 to 1 time with Social worker, Explore available resources and support systems, Assess for adequacy in community support network, Educate family and significant other(s) on suicide prevention, Complete Psychosocial Assessment, Interpersonal group therapy.  Evaluation of Outcomes: Progressing   Progress in Treatment: Attending groups:  Yes Participating in groups:  Yes Taking medication as prescribed: Yes. Toleration medication: Yes. Family/Significant other contact made: Yes, contacted:  Garrett Bolton (mom / legal guardian) [phone]  Patient understands diagnosis: No. Discussing patient identified problems/goals with staff: No. Medical problems stabilized or resolved: Yes. Denies suicidal/homicidal ideation: Yes. Issues/concerns per patient self-inventory: No.   New problem(s) identified:  No   New Short Term/Long Term Goal(s):     medication stabilization,  elimination of SI thoughts, development of comprehensive mental wellness plan.      Patient Goals:  When I'm anxious, I get tired easily.  I want to be discharged.    Discharge Plan or Barriers:  Patient recently admitted. CSW will continue to follow and assess for appropriate referrals and possible discharge planning.    Reason for Continuation of Hospitalization: Hallucinations Medication stabilization Suicidal ideation   Estimated Length of Stay:  4 - 6 days  Last 3 [place] Suicide Severity Risk Score: Flowsheet Row Ad[facility] (Current) [facility] from [facility]21/20[facility] ED from [facility]25/2021 in [facility]  C-SSRS RISK CATEGORY No Risk No Risk No Risk    Last [facility] 2/9 Scores:    02/21/2021    4:55 PM  Depression screen PHQ 2/9  Decreased Interest 1  Down, Depressed, Hopeless 1  PHQ - 2 Score 2  Altered sleeping 1  Tired, decreased energy 0  Change in appetite 0  Feeling bad or failure about yourself  1  Trouble concentrating 0  Moving slowly or fidgety/restless 0  Suicidal thoughts 0  PHQ-9 Score 4  Difficult doing work/chores Somewhat difficult    Scribe for Treatment Team: Zakyah Yanes O Fabrizio Filip, LCSWA 11/12/2023 5:42 PM

## 2023-11-12 NOTE — Progress Notes (Signed)
   11/11/23 2157  Psych Admission Type (Psych Patients Only)  Admission Status Involuntary  Psychosocial Assessment  Patient Complaints None  Eye Contact Fair  Facial Expression Flat  Affect Appropriate to circumstance  Speech Logical/coherent  Interaction Guarded  Motor Activity Other (Comment) (WDL)  Appearance/Hygiene Unremarkable  Behavior Characteristics Cooperative;Calm  Mood Pleasant  Thought Process  Coherency WDL  Content WDL  Delusions None reported or observed  Perception WDL  Hallucination None reported or observed  Judgment Poor  Confusion None  Danger to Self  Current suicidal ideation? Denies  Agreement Not to Harm Self Yes  Description of Agreement Verbal  Danger to Others  Danger to Others None reported or observed

## 2023-11-12 NOTE — Progress Notes (Signed)
(  Sleep Hours) - 9.25 (Any PRNs that were needed, meds refused, or side effects to meds)- PRN vistaril  25 mg and trazodone  50 mg given at pt request, no meds refused.  (Any disturbances and when (visitation, over night)- None  (Concerns raised by the patient)- None  (SI/HI/AVH)- Denies SI/HI/AVH

## 2023-11-12 NOTE — Group Note (Signed)
 Date:  11/12/2023 Time:  9:57 AM  Group Topic/Focus:  Goals Group:   The focus of this group is to help patients establish daily goals to achieve during treatment and discuss how the patient can incorporate goal setting into their daily lives to aide in recovery. Orientation:   The focus of this group is to educate the patient on the purpose and policies of crisis stabilization and provide a format to answer questions about their admission.  The group details unit policies and expectations of patients while admitted.    Participation Level:  Active  Participation Quality:  Appropriate  Affect:  Appropriate  Cognitive:  Appropriate  Insight: Appropriate  Engagement in Group:  Engaged  Modes of Intervention:  Discussion  Additional Comments:  Pt 's goal is to,  Stay in here and watch TV  Adriana GORMAN Blush 11/12/2023, 9:57 AM

## 2023-11-13 NOTE — Progress Notes (Signed)
   11/13/23 2100  Psych Admission Type (Psych Patients Only)  Admission Status Involuntary  Psychosocial Assessment  Patient Complaints None  Eye Contact Fair  Facial Expression Animated  Affect Appropriate to circumstance  Speech Logical/coherent;Soft  Interaction Childlike  Motor Activity Slow  Appearance/Hygiene Unremarkable  Behavior Characteristics Cooperative  Mood Pleasant  Thought Process  Coherency WDL  Content WDL  Delusions None reported or observed  Perception WDL  Hallucination None reported or observed  Judgment Limited  Confusion None  Danger to Self  Current suicidal ideation? Denies  Agreement Not to Harm Self Yes  Description of Agreement Verbal  Danger to Others  Danger to Others None reported or observed

## 2023-11-13 NOTE — [facility] Group Notes (Signed)
 Psychoeducational Group Note  Date:  11/13/2023 Time:  2102  Group Topic/Focus:  Wrap-Up Group:   The focus of this group is to help patients review their daily goal of treatment and discuss progress on daily workbooks.  Participation Level: Did Not Attend  Participation Quality:  Not Applicable  Affect:  Not Applicable  Cognitive:  Not Applicable  Insight:  Not Applicable  Engagement in Group: Not Applicable  Additional Comments:  The patient did not attend group this evening.   Leba Tibbitts S 11/13/2023, 9:02 PM

## 2023-11-13 NOTE — Progress Notes (Signed)
 [facility] MD Progress Note  11/13/2023 11:24 AM Garrett Bolton  MRN:  [id number] Subjective:   Garrett Bolton is a 23 y.o. male with a past psychiatric history of schizophrenia, unspecific schizophrenia spectrum disorder, substance induced psychosis, history of cannabis use who presents involuntarily to [facility] from [facility] emergency department due to thought blocking, paranoia, disorganization and erratic behavior in the setting of medication noncompliance.  He was transferred here for symptom stabilization and medication management. Denies issues with sleep or appetite.   24 hour events:  (Sleep Hours) - 7.75 (Any PRNs that were needed, meds refused, or side effects to meds)- No PRN meds given, no meds refused.  (Any disturbances and when (visitation, over night)- None  (Concerns raised by the patient)- None  (SI/HI/AVH)- Denies SI/HI/AVH  Patient interview:  Patient continues to be clear and organized with answering providers questions.  Is not overtly responding to internal stimuli.  Denies any complaints with long-acting injectable administration yesterday.  Denies any depressive symptoms, anxiety, suicidal ideations auditory hallucinations or visual hallucinations or homicidal ideations.  Patient amenable with receiving second injectable on Thursday and will consider discharge shortly afterwards.  No additional complaints noted   Collateral, Mom, [phone], attempted at 1006 on 8/25 Attempted to call x1, No answer and went to voice mail.   Collateral, [name] Ahumada, [phone], 1400 8/24 Attempted to call x2, will re-attempt to gain for further collateral and discuss further dispo planning.    Collateral, conducted by Child psychotherapist, 11/08/2023  [facility] INPATIENT:  Family/Significant Other Suicide Prevention Education   Suicide Prevention Education:  Education Completed; Carden Teel (mom / legal guardian) [phone],  (name of family member/significant other) has been  identified by the patient as the family member/significant other with whom the patient will be residing, and identified as the person(s) who will aid the patient in the event of a mental health crisis (suicidal ideations/suicide attempt).  With written consent from the patient, the family member/significant other has been provided the following suicide prevention education, prior to the and/or following the discharge of the patient.   Mom said that she is patient's legal guardian and patient's sister, [name], will drop off the paperwork.  Mom said someone will be available to pick up patient upon discharge.   Mom said that until recently patient was staying with his sister in [place], but experienced symptoms, used marijuana and was on streets, so he couldn't stay with her any longer, and became homeless.  Mom said she is in the process of applying for social security benefits for patient.     Mom said that she lives in [place], [place] (2 hours away).  She lives with someone, so patient can't stay with her.   Mom said patient doesn't have any guns or weapons.     Mom said that patient was using marijuana, and walked and walked for so long that he developed blisters on his feet.   Mom wasn't sure if patient is receiving ACTT services ([facility]), or has other providers.  She believes he has Medicaid.  Mom will speak with her daughter (patient's sister), and will call CSW with this information.    Principal Problem: Schizophrenia (HCC) Diagnosis: Principal Problem:   Schizophrenia (HCC) Active Problems:   Episodic cannabis use  Total Time spent with patient: 30 minutes  Past Psychiatric History:   Previous Psych Diagnoses: schizophrenia, unspecific schizophrenia spectrum disorder, substance induced psychosis, history of cannabis use Prior inpatient treatment: Multiple, 03/2019, 04/2019, 04/2019, 02/2020,  12/2020 and per chart review last hospitalization was with old [facility] in  2023 Current/prior outpatient treatment: Yes, not currently following with psychiatry Prior rehab hx: Unable to assess due to disorganization Psychotherapy hx: Unable to assess due to disorganization History of suicide: Unable to assess due to disorganization History of homicide or aggression: Unable to assess due to disorganization Psychiatric medication history: Per chart review Invega , Risperdal , Depakote , trazodone , hydroxyzine , benztropine , Haldol  Psychiatric medication compliance history: Noncompliance Neuromodulation history: Unaware Current Psychiatrist: Denies Current therapist: Denies  Past Medical History:  Past Medical History:  Diagnosis Date   Anemia    Sickle cell trait (HCC)     Past Surgical History:  Procedure Laterality Date   CIRCUMCISION  2001   LAPAROTOMY N/A 08/23/2019   Procedure: EXPLORATORY LAPAROTOMY;  Surgeon: Sebastian Moles, MD;  Location: [facility] OR;  Service: General;  Laterality: N/A;   Family History: History reviewed. No pertinent family history. Family Psychiatric  History:  Psych:depression on mother side,  bipolar and schizophrenia in paternal great grandparents per EMR  Psych Mk:Ejupzwu unable to disclose due to disorganization SA/HA:Patient unable to disclose due to disorganization Substance use family yk:Ejupzwu unable to disclose due to disorganization  Social History:  Social History   Substance and Sexual Activity  Alcohol Use Yes     Social History   Substance and Sexual Activity  Drug Use Yes   Types: Methamphetamines, Marijuana   Comment: Daily    Social History   Socioeconomic History   Marital status: Single    Spouse name: Not on file   Number of children: Not on file   Years of education: Not on file   Highest education level: Not on file  Occupational History   Not on file  Tobacco Use   Smoking status: Some Days    Current packs/day: 0.25    Average packs/day: 0.3 packs/day for 4.0 years (1.0 ttl pk-yrs)    Types:  Cigars, Cigarettes   Smokeless tobacco: Never  Vaping Use   Vaping status: Never Used  Substance and Sexual Activity   Alcohol use: Yes   Drug use: Yes    Types: Methamphetamines, Marijuana    Comment: Daily   Sexual activity: Never  Other Topics Concern   Not on file  Social History Narrative   Not on file   Social Drivers of Health   Financial Resource Strain: Not on file  Food Insecurity: Food Insecurity Present (11/06/2023)   Hunger Vital Sign    Worried About Running Out of Food in the Last Year: Often true    Ran Out of Food in the Last Year: Often true  Transportation Needs: Unmet Transportation Needs (11/06/2023)   PRAPARE - Administrator, Civil Service (Medical): Yes    Lack of Transportation (Non-Medical): Yes  Physical Activity: Not on file  Stress: Not on file  Social Connections: Not on file   Additional Social History:                         Sleep: Good Estimated Sleeping Duration (Last 24 Hours): 6.50-7.75 hours  Appetite:  Good  Current Medications: Current Facility-Administered Medications  Medication Dose Route Frequency Provider Last Rate Last Admin   acetaminophen  (TYLENOL ) tablet 650 mg  650 mg Oral Q6H PRN Coleman, Carolyn H, NP       alum & mag hydroxide-simeth (MAALOX/MYLANTA) 200-200-20 MG/5ML suspension 30 mL  30 mL Oral Q4H PRN Mardy Elveria DEL, NP  feeding supplement (ENSURE PLUS HIGH PROTEIN) liquid 237 mL  237 mL Oral BID BM Butler, Laura N, MD   237 mL at 11/12/23 9045   hydrOXYzine  (ATARAX ) tablet 25 mg  25 mg Oral TID PRN Coleman, Carolyn H, NP   25 mg at 11/11/23 2000   magnesium  hydroxide (MILK OF MAGNESIA) suspension 30 mL  30 mL Oral Daily PRN Coleman, Carolyn H, NP       OLANZapine  (ZYPREXA ) injection 10 mg  10 mg Intramuscular TID PRN Lenard Calin, MD       OLANZapine  (ZYPREXA ) injection 5 mg  5 mg Intramuscular TID PRN Lenard Calin, MD       OLANZapine  zydis (ZYPREXA ) disintegrating tablet 5 mg   5 mg Oral TID PRN Lenard Calin, MD       risperiDONE  (RISPERDAL  M-TABS) disintegrating tablet 2 mg  2 mg Oral BID Lenard Calin, MD   2 mg at 11/13/23 9193   traZODone  (DESYREL ) tablet 50 mg  50 mg Oral QHS PRN Coleman, Carolyn H, NP   50 mg at 11/11/23 2000    Lab Results:  No results found for this or any previous visit (from the past 48 hours).   Blood Alcohol level:  Lab Results  Component Value Date   [facility] <15 11/05/2023   ETH <10 08/14/2021    Metabolic Disorder Labs: Lab Results  Component Value Date   HGBA1C 4.5 (L) 11/08/2023   MPG 82.45 11/08/2023   MPG 79.58 08/14/2021   Lab Results  Component Value Date   PROLACTIN 15.2 04/21/2019   Lab Results  Component Value Date   CHOL 120 11/08/2023   TRIG 56 11/08/2023   HDL 56 11/08/2023   CHOLHDL 2.1 11/08/2023   VLDL 11 11/08/2023   LDLCALC 53 11/08/2023   LDLCALC 90 01/01/2021    Physical Findings: AIMS:  ,  ,  ,  ,  ,  ,   CIWA:    COWS:     Musculoskeletal: Strength & Muscle Tone: within normal limits Gait & Station: normal Patient leans: N/A  Psychiatric Specialty Exam:  Presentation  General Appearance:  Appropriate for Environment; Casual  Eye Contact: Good  Speech: Clear and Coherent; Normal Rate  Speech Volume: Normal  Handedness: Right   Mood and Affect  Mood: Euthymic  Affect: Appropriate; Congruent   Thought Process  Thought Processes: Coherent; Linear  Descriptions of Associations:Intact  Orientation:Full (Time, Place and Person)  Thought Content:Logical; WDL  History of Schizophrenia/Schizoaffective disorder:Yes  Duration of Psychotic Symptoms:Greater than six months  Hallucinations:Hallucinations: None   Ideas of Reference:None  Suicidal Thoughts:Suicidal Thoughts: No   Homicidal Thoughts:Homicidal Thoughts: No    Sensorium  Memory: Immediate Fair  Judgment: Fair  Insight: Fair   Art therapist  Concentration: Fair  Attention  Span: Fair  Recall: [employer] of Knowledge: Fair  Language: Fair   Psychomotor Activity  Psychomotor Activity: Psychomotor Activity: Normal   Assets  Assets: Desire for Improvement; Communication Skills   Sleep  Sleep: Sleep: Good Number of Hours of Sleep: 7.75     Physical Exam: Physical Exam Constitutional:      General: He is not in acute distress.    Appearance: He is not toxic-appearing or diaphoretic.  Eyes:     Conjunctiva/sclera: Conjunctivae normal.  Pulmonary:     Effort: Pulmonary effort is normal.  Musculoskeletal:        General: Normal range of motion.  Neurological:     General: No focal deficit present.  Mental Status: He is alert.    Review of Systems  Constitutional:  Negative for chills, diaphoresis and fever.  Respiratory:  Negative for cough.   Gastrointestinal:  Negative for nausea and vomiting.  Neurological:  Negative for headaches.  Psychiatric/Behavioral:  Negative for depression, hallucinations, substance abuse and suicidal ideas. The patient is not nervous/anxious and does not have insomnia.    Blood pressure 110/65, pulse 91, temperature 97.6 F (36.4 C), resp. rate (!) 22, height 5' 10 (1.778 m), weight 55.3 kg, SpO2 100%. Body mass index is 17.51 kg/m.  Treatment Plan Summary: Kamil Mchaffie is a 23 y.o. male with a past psychiatric history of schizophrenia, unspecific schizophrenia spectrum disorder, substance induced psychosis, history of cannabis use who presents involuntarily to [facility] from [facility] emergency department due to thought blocking, paranoia, disorganization and erratic behavior in the setting of medication noncompliance.  He was transferred here for symptom stabilization and medication management.   On intake assessment, patient does appear to be psychotic given disorganization, responding to internal stimuli on the floor throughout interview and lack of insight of events leading up to  hospitalization.  Patient is calm and cooperative throughout interview and has not required any agitation protocol.  Patient reports that he was previously on risperidone  help control symptoms.  However per the pharmacy dispensed report patient has not recently had risperidone  filled (no record of dispenses since 04/2023). Patient has been hospitalized here multiple times before and seemed to do well on risperidone .  Will restart a low dose and continue to adjust to optimize symptom stabilization and hopes of transitioning to LAI.  We will call mother for further collateral after given patient's consent during intake assessment.  8/25:  On morning assessment, patient continues to improve we will remain on p.o. Risperdal  and denying any significant side effects after a long-acting injectable injection yesterday.  Still attempting to get in contact with mother or sister to discuss dispo planning.  Interim prior instances social worker has reported that someone can pick patient up once stable for discharge.  We will continue to attempt to discuss with her.  As of now the patient continues to improve on a long-acting injectable can consider discharge Friday after administration of second dose Thursday.   Diagnoses / Active Problems: Schizophrenia  Cannabis Use (per UDS)    PLAN: Safety and Monitoring:             --  Involuntary admission to inpatient psychiatric unit for safety, stabilization and treatment             -- Daily contact with patient to assess and evaluate symptoms and progress in treatment             -- Patient's case to be discussed in multi-disciplinary team meeting             -- Observation Level : q15 minute checks             -- Vital signs:  q12 hours             -- Precautions: suicide, elopement, and assault   2. Psychiatric Diagnoses and Treatment: --Continue Risperdal  2 mg twice daily for psychosis, can consider discontinuing after second LAI -- Administer Invega  Sustena  234 mg dose today, and 156 mg 8/28 --As needed agitation protocol ordered, Zyprexa  ordered and discontinued Haldol  given patient prior history of acute dystonic reaction with Haldol  -- PRNs available: Detailed in the [facility] --  The risks/benefits/side-effects/alternatives to this  medication were discussed in detail with the patient and time was given for questions. The patient consents to medication trial.              -- Metabolic profile and EKG monitoring obtained while on an atypical antipsychotic (BMI: Lipid Panel: HbgA1c: QTc:)             -- Labs unremarkable              -- Encouraged patient to participate in unit milieu and in scheduled group therapies              -- Short Term Goals: Ability to identify changes in lifestyle to reduce recurrence of condition will improve, Ability to maintain clinical measurements within normal limits will improve, Compliance with prescribed medications will improve, and Ability to identify triggers associated with substance abuse/mental health issues will improve             -- Long Term Goals: Improvement in symptoms so as ready for discharge   3. Medical Issues Being Addressed:              Cannabis Use              -- UDS positive for THC              -- Smoking cessation encouraged   4. Discharge Planning:              -- Social work and case management to assist with discharge planning and identification of hospital follow-up needs prior to discharge             -- Estimated LOS: 7-14 days, possible Friday or weekend               -- Discharge Concerns:Medication compliance and effectiveness             -- Discharge Goals: Return home with outpatient referrals for mental health follow-up including medication management/psychotherapy   I certify that inpatient services furnished can reasonably be expected to improve the patient's condition.    PATTI OLDEN, MD 11/13/2023, 11:24 AM

## 2023-11-13 NOTE — Group Note (Signed)
 Recreation Therapy Group Note   Group Topic:Self-Esteem  Group Date: 11/13/2023 Start Time: 1040 End Time: 1115 Facilitators: Artesha Wemhoff-McCall, LRT,CTRS Location: [address] Dayroom   Group Topic/Focus: Self Expression   Goal Area(s) Addresses:  Patient will identify positive things about themselves. Patient will identify the importance of being self confidence.     Behavioral Response:   Intervention: Drawing  Activity : LRT discussed with patients the importance of self expression. Patients were then given a picture of a blank face, and told to illustrate and describe how they see and feel about themselves. Patients were given colored pencils, markers, and crayons to complete the assignment. Patients shared their completed assignment with each other.   Education: Self Expression, Discharge Planning  Educational Outcome: Acknowledges education   Affect/Mood: N/A   Participation Level: Did not attend    Clinical Observations/Individualized Feedback:     Plan: Continue to engage patient in RT group sessions 2-3x/week.   Alena Blankenbeckler-McCall, LRT,CTRS 11/13/2023 1:24 PM

## 2023-11-13 NOTE — Progress Notes (Signed)
 Collateral contact - Damoni Causby (mom / legal guardian) [phone]  CSW left a voicemail.   Trannie Bardales, LCSWA 11/13/2023

## 2023-11-13 NOTE — [facility] Group Notes (Signed)
 Adult Psychoeducational Group Note  Date:  11/13/2023 Time:  10:07 AM  Group Topic/Focus:  Goals Group:   The focus of this group is to help patients establish daily goals to achieve during treatment and discuss how the patient can incorporate goal setting into their daily lives to aide in recovery.  Participation Level:  Active  Participation Quality:  Appropriate  Affect:  Appropriate  Cognitive:  Alert  Insight: Appropriate  Engagement in Group:  Engaged  Modes of Intervention:  Orientation  Additional Comments:  Pt goal for today is to attend all groups  Niel CHRISTELLA Nightingale 11/13/2023, 10:07 AM

## 2023-11-13 NOTE — Plan of Care (Signed)
   Problem: Coping: Goal: Ability to demonstrate self-control will improve Outcome: Progressing   Problem: Health Behavior/Discharge Planning: Goal: Compliance with treatment plan for underlying cause of condition will improve Outcome: Progressing   Problem: Safety: Goal: Periods of time without injury will increase Outcome: Progressing

## 2023-11-14 MED ORDER — PALIPERIDONE PALMITATE ER 156 MG/ML IM SUSY
156.0000 mg | PREFILLED_SYRINGE | Freq: Once | INTRAMUSCULAR | Status: AC
  Administered 2023-11-15: 156 mg via INTRAMUSCULAR

## 2023-11-14 NOTE — Plan of Care (Signed)
  Problem: Education: Goal: Mental status will improve Outcome: Progressing   Problem: Education: Goal: Mental status will improve 11/14/2023 0551 by Tilford Billye HERO, RN Outcome: Progressing 11/14/2023 0551 by Tilford Billye HERO, RN Outcome: Progressing   Problem: Activity: Goal: Interest or engagement in activities will improve Outcome: Progressing

## 2023-11-14 NOTE — Group Note (Signed)
 Recreation Therapy Group Note   Group Topic:Health and Wellness  Group Date: 11/14/2023 Start Time: 1015 End Time: 1040 Facilitators: Lamyiah Crawshaw-McCall, LRT,CTRS Location: [address] Dayroom   Group Topic: Exercise/Wellness  Goal Area(s) Addresses:  Patient will actively participate in selected exercises.  Patient will verbalize benefit of exercise during group session. Patient will acknowledge benefits of exercise when used as a coping mechanism.   Behavioral Response: Engaged  Intervention: Music  Activity: Exercise. LRT and patients discussed the importance of physical exercise. Patients took turns leading the group in the exercises and stretches of their choosing. Patients were encouraged to do their best but not strain themselves. Patients were encouraged to take breaks or get water  as needed.  Education: Physical Activity, Health and Wellness  Education Outcome: Acknowledges understanding/In group clarification offered/Needs additional education.    Affect/Mood: Appropriate   Participation Level: Engaged   Participation Quality: Independent   Behavior: Appropriate   Speech/Thought Process: Focused   Insight: Good   Judgement: Good   Modes of Intervention: Music   Patient Response to Interventions:  Engaged   Education Outcome:  In group clarification offered    Clinical Observations/Individualized Feedback: Pt was bright and engaged. Pt led group in high knees. Pt was also social with peers and LRT. Pt participated for the entirety of group.      Plan: Continue to engage patient in RT group sessions 2-3x/week.   Garrett Bolton, LRT,CTRS 11/14/2023 12:52 PM

## 2023-11-14 NOTE — Progress Notes (Signed)
 Collateral contact - Brayan Votaw (mom / legal guardian) [phone]  CSW asked mom for the guardianship paperwork.  Mom said her daughter was out of town so couldn't check on that.    Mom said that she will be in [facility] tomorrow, Thursday, 11/15/2023, and would be available to pick him up around lunchtime.  If he is discharged on Friday, 11/16/2023, patient's sister would pick him up.   Whitni Pasquini, LCSWA 11/14/2023

## 2023-11-14 NOTE — Progress Notes (Signed)
   11/14/23 2100  Psych Admission Type (Psych Patients Only)  Admission Status Involuntary  Psychosocial Assessment  Patient Complaints None  Eye Contact Fair  Facial Expression Animated;Fixed smile  Affect Appropriate to circumstance  Speech Logical/coherent;Soft  Interaction Childlike  Motor Activity Slow  Appearance/Hygiene Unremarkable  Behavior Characteristics Cooperative  Mood Pleasant  Thought Process  Coherency WDL  Content WDL  Delusions None reported or observed  Perception WDL  Hallucination None reported or observed  Judgment Limited  Confusion None  Danger to Self  Current suicidal ideation? Denies  Agreement Not to Harm Self Yes  Description of Agreement Verbal  Danger to Others  Danger to Others None reported or observed

## 2023-11-14 NOTE — Progress Notes (Addendum)
 [facility] MD Progress Note  11/14/2023 3:34 PM Garrett Bolton  MRN:  [id number] Subjective:   Garrett Bolton is a 23 y.o. male with a past psychiatric history of schizophrenia, unspecific schizophrenia spectrum disorder, substance induced psychosis, history of cannabis use who presents involuntarily to [facility] from [facility] emergency department due to thought blocking, paranoia, disorganization and erratic behavior in the setting of medication noncompliance.  He was transferred here for symptom stabilization and medication management. Denies issues with sleep or appetite.   24 hour events:  (Sleep Hours) - 7.75 (Any PRNs that were needed, meds refused, or side effects to meds)- No PRN meds given, no meds refused.  (Any disturbances and when (visitation, over night)- None  (Concerns raised by the patient)- None  (SI/HI/AVH)- Denies SI/HI/AVH  Patient interview:  Patient continues to be clear and organized with answering providers questions.  Is not overtly responding to internal stimuli. Denies any depressive symptoms, anxiety, suicidal ideations auditory hallucinations or visual hallucinations or homicidal ideations.  Patient amenable with receiving second injectable on Thursday.  No additional complaints noted  Collateral, Garrett Bolton, [phone] 1521 8/27 Patient granted consent to speak with sister about discharge planning and about hospital course without restrictions.  Sister states that she can come pick Garrett Bolton up at 1 PM on Friday after discharge.  Discussed medication regimen and administration of long-acting injectable has shown notable improvements in patient interactions. She reports compliance has been an issue with him before in the past and she has had to IVC him multiple times previously due to it.  Reports that in prior instances he has been disorganized and not making sense. Discussed follow-up appointments. Opportunity to ask additional questions was given.   Principal  Problem: Schizophrenia, unspecified (HCC) Diagnosis: Principal Problem:   Schizophrenia, unspecified (HCC) Active Problems:   Episodic cannabis use  Total Time spent with patient: 30 minutes  Past Psychiatric History:   Previous Psych Diagnoses: schizophrenia, unspecific schizophrenia spectrum disorder, substance induced psychosis, history of cannabis use Prior inpatient treatment: Multiple, 03/2019, 04/2019, 04/2019, 02/2020, 12/2020 and per chart review last hospitalization was with old [facility] in 2023 Current/prior outpatient treatment: Yes, not currently following with psychiatry Prior rehab hx: Unable to assess due to disorganization Psychotherapy hx: Unable to assess due to disorganization History of suicide: Unable to assess due to disorganization History of homicide or aggression: Unable to assess due to disorganization Psychiatric medication history: Per chart review Invega , Risperdal , Depakote , trazodone , hydroxyzine , benztropine , Haldol  Psychiatric medication compliance history: Noncompliance Neuromodulation history: Unaware Current Psychiatrist: Denies Current therapist: Denies  Past Medical History:  Past Medical History:  Diagnosis Date   Anemia    Sickle cell trait (HCC)     Past Surgical History:  Procedure Laterality Date   CIRCUMCISION  2001   LAPAROTOMY N/A 08/23/2019   Procedure: EXPLORATORY LAPAROTOMY;  Surgeon: Sebastian Moles, MD;  Location: [facility] OR;  Service: General;  Laterality: N/A;   Family History: History reviewed. No pertinent family history. Family Psychiatric  History:  Psych:depression on mother side,  bipolar and schizophrenia in paternal great grandparents per EMR  Psych Mk:Ejupzwu unable to disclose due to disorganization SA/HA:Patient unable to disclose due to disorganization Substance use family yk:Ejupzwu unable to disclose due to disorganization  Social History:  Social History   Substance and Sexual Activity  Alcohol Use Yes      Social History   Substance and Sexual Activity  Drug Use Yes   Types: Methamphetamines, Marijuana   Comment: Daily  Social History   Socioeconomic History   Marital status: Single    Spouse name: Not on file   Number of children: Not on file   Years of education: Not on file   Highest education level: Not on file  Occupational History   Not on file  Tobacco Use   Smoking status: Some Days    Current packs/day: 0.25    Average packs/day: 0.3 packs/day for 4.0 years (1.0 ttl pk-yrs)    Types: Cigars, Cigarettes   Smokeless tobacco: Never  Vaping Use   Vaping status: Never Used  Substance and Sexual Activity   Alcohol use: Yes   Drug use: Yes    Types: Methamphetamines, Marijuana    Comment: Daily   Sexual activity: Never  Other Topics Concern   Not on file  Social History Narrative   Not on file   Social Drivers of Health   Financial Resource Strain: Not on file  Food Insecurity: Food Insecurity Present (11/06/2023)   Hunger Vital Sign    Worried About Running Out of Food in the Last Year: Often true    Ran Out of Food in the Last Year: Often true  Transportation Needs: Unmet Transportation Needs (11/06/2023)   PRAPARE - Administrator, Civil Service (Medical): Yes    Lack of Transportation (Non-Medical): Yes  Physical Activity: Not on file  Stress: Not on file  Social Connections: Not on file   Additional Social History:                         Sleep: Good Estimated Sleeping Duration (Last 24 Hours): 7.25-9.00 hours  Appetite:  Good  Current Medications: Current Facility-Administered Medications  Medication Dose Route Frequency Provider Last Rate Last Admin   acetaminophen  (TYLENOL ) tablet 650 mg  650 mg Oral Q6H PRN Coleman, Carolyn H, NP       alum & mag hydroxide-simeth (MAALOX/MYLANTA) 200-200-20 MG/5ML suspension 30 mL  30 mL Oral Q4H PRN Mardy Elveria DEL, NP       feeding supplement (ENSURE PLUS HIGH PROTEIN) liquid 237 mL   237 mL Oral BID BM Towana Leita SAILOR, MD   237 mL at 11/13/23 1500   hydrOXYzine  (ATARAX ) tablet 25 mg  25 mg Oral TID PRN Coleman, Carolyn H, NP   25 mg at 11/11/23 2000   magnesium  hydroxide (MILK OF MAGNESIA) suspension 30 mL  30 mL Oral Daily PRN Mardy Elveria DEL, NP       OLANZapine  (ZYPREXA ) injection 10 mg  10 mg Intramuscular TID PRN Lenard Calin, MD       OLANZapine  (ZYPREXA ) injection 5 mg  5 mg Intramuscular TID PRN Lenard Calin, MD       OLANZapine  zydis (ZYPREXA ) disintegrating tablet 5 mg  5 mg Oral TID PRN Lenard Calin, MD       NOREEN ON 11/15/2023] paliperidone  (INVEGA  SUSTENNA) injection 156 mg  156 mg Intramuscular Once Lenard Calin, MD       risperiDONE  (RISPERDAL  M-TABS) disintegrating tablet 2 mg  2 mg Oral BID Lenard Calin, MD   2 mg at 11/14/23 0810   traZODone  (DESYREL ) tablet 50 mg  50 mg Oral QHS PRN Coleman, Carolyn H, NP   50 mg at 11/11/23 2000    Lab Results:  No results found for this or any previous visit (from the past 48 hours).   Blood Alcohol level:  Lab Results  Component Value Date   Hemet Endoscopy <15 11/05/2023  ETH <10 08/14/2021    Metabolic Disorder Labs: Lab Results  Component Value Date   HGBA1C 4.5 (L) 11/08/2023   MPG 82.45 11/08/2023   MPG 79.58 08/14/2021   Lab Results  Component Value Date   PROLACTIN 15.2 04/21/2019   Lab Results  Component Value Date   CHOL 120 11/08/2023   TRIG 56 11/08/2023   HDL 56 11/08/2023   CHOLHDL 2.1 11/08/2023   VLDL 11 11/08/2023   LDLCALC 53 11/08/2023   LDLCALC 90 01/01/2021    Physical Findings: AIMS:  ,  ,  ,  ,  ,  ,   CIWA:    COWS:     Musculoskeletal: Strength & Muscle Tone: within normal limits Gait & Station: normal Patient leans: N/A  Psychiatric Specialty Exam:  Presentation  General Appearance:  Appropriate for Environment; Casual  Eye Contact: Good  Speech: Clear and Coherent; Normal Rate  Speech Volume: Normal  Handedness: Right   Mood and  Affect  Mood: Euthymic  Affect: Appropriate; Congruent   Thought Process  Thought Processes: Coherent; Linear  Descriptions of Associations:Intact  Orientation:Full (Time, Place and Person)  Thought Content:Logical; WDL  History of Schizophrenia/Schizoaffective disorder:Yes  Duration of Psychotic Symptoms:Greater than six months  Hallucinations:Hallucinations: None   Ideas of Reference:None  Suicidal Thoughts:Suicidal Thoughts: No   Homicidal Thoughts:Homicidal Thoughts: No    Sensorium  Memory: Immediate Fair  Judgment: Fair  Insight: Fair   Art therapist  Concentration: Fair  Attention Span: Fair  Recall: Fair  Fund of Knowledge: Fair  Language: Fair   Psychomotor Activity  Psychomotor Activity: Psychomotor Activity: Normal   Assets  Assets: Desire for Improvement; Communication Skills   Sleep  Sleep: Sleep: Good   Physical Exam: Physical Exam Constitutional:      General: He is not in acute distress.    Appearance: He is not toxic-appearing or diaphoretic.  Eyes:     Conjunctiva/sclera: Conjunctivae normal.  Pulmonary:     Effort: Pulmonary effort is normal.  Musculoskeletal:        General: Normal range of motion.  Neurological:     General: No focal deficit present.     Mental Status: He is alert.    Review of Systems  Constitutional:  Negative for chills, diaphoresis and fever.  Respiratory:  Negative for cough.   Gastrointestinal:  Negative for nausea and vomiting.  Neurological:  Negative for headaches.  Psychiatric/Behavioral:  Negative for depression, hallucinations, substance abuse and suicidal ideas. The patient is not nervous/anxious and does not have insomnia.    Blood pressure 103/69, pulse (!) 102, temperature 97.6 F (36.4 C), resp. rate (!) 22, height 5' 10 (1.778 m), weight 55.3 kg, SpO2 99%. Body mass index is 17.51 kg/m.  Treatment Plan Summary: Sharief Wainwright is a 23 y.o. male  with a past psychiatric history of schizophrenia, unspecific schizophrenia spectrum disorder, substance induced psychosis, history of cannabis use who presents involuntarily to [facility] from [facility] emergency department due to thought blocking, paranoia, disorganization and erratic behavior in the setting of medication noncompliance.  He was transferred here for symptom stabilization and medication management.   On intake assessment, patient does appear to be psychotic given disorganization, responding to internal stimuli on the floor throughout interview and lack of insight of events leading up to hospitalization.  Patient is calm and cooperative throughout interview and has not required any agitation protocol.  Patient reports that he was previously on risperidone  help control symptoms.  However per the pharmacy  dispensed report patient has not recently had risperidone  filled (no record of dispenses since 04/2023). Patient has been hospitalized here multiple times before and seemed to do well on risperidone .  Will restart a low dose and continue to adjust to optimize symptom stabilization and hopes of transitioning to LAI.  We will call mother for further collateral after given patient's consent during intake assessment.  8/25:  On morning assessment, patient continues to improve we will remain on p.o. Risperdal  and denying any significant side effects after a long-acting injectable injection on 8/25, will administer 2nd dose on 8/28.  Sister states she can pick patient up on 8/29 at 1 PM if he continues to be stable for discharge.   Diagnoses / Active Problems: Schizophrenia  Cannabis Use (per UDS)    PLAN: Safety and Monitoring:             --  Involuntary admission to inpatient psychiatric unit for safety, stabilization and treatment             -- Daily contact with patient to assess and evaluate symptoms and progress in treatment             -- Patient's case to be discussed in multi-disciplinary  team meeting             -- Observation Level : q15 minute checks             -- Vital signs:  q12 hours             -- Precautions: suicide, elopement, and assault   2. Psychiatric Diagnoses and Treatment: --Continue Risperdal  2 mg twice daily for psychosis, can consider discontinuing after second LAI -- Administer Invega  Sustena 234 mg dose today, and 156 mg 8/28 --As needed agitation protocol ordered, Zyprexa  ordered and discontinued Haldol  given patient prior history of acute dystonic reaction with Haldol  -- PRNs available: Detailed in the [facility] --  The risks/benefits/side-effects/alternatives to this medication were discussed in detail with the patient and time was given for questions. The patient consents to medication trial.              -- Metabolic profile and EKG monitoring obtained while on an atypical antipsychotic (BMI: Lipid Panel: HbgA1c: QTc:)             -- Labs unremarkable              -- Encouraged patient to participate in unit milieu and in scheduled group therapies              -- Short Term Goals: Ability to identify changes in lifestyle to reduce recurrence of condition will improve, Ability to maintain clinical measurements within normal limits will improve, Compliance with prescribed medications will improve, and Ability to identify triggers associated with substance abuse/mental health issues will improve             -- Long Term Goals: Improvement in symptoms so as ready for discharge   3. Medical Issues Being Addressed:              Cannabis Use              -- UDS positive for THC              -- Smoking cessation encouraged   4. Discharge Planning:              -- Social work and case management to assist with discharge planning and identification of hospital follow-up  needs prior to discharge             -- Estimated LOS: 7-14 days, Planning for 8/29              -- Discharge Concerns:Medication compliance and effectiveness             -- Discharge Goals:  Return home with outpatient referrals for mental health follow-up including medication management/psychotherapy   I certify that inpatient services furnished can reasonably be expected to improve the patient's condition.    PATTI OLDEN, MD 11/14/2023, 3:34 PM

## 2023-11-14 NOTE — [facility] Suicide Risk Assessment (Signed)
 [facility] INPATIENT:  Family/Significant Other Suicide Prevention Education  Suicide Prevention Education:  Education Completed; Shain Pauwels (sister) [phone],  (name of family member/significant other) has been identified by the patient as the family member/significant other with whom the patient will be residing, and identified as the person(s) who will aid the patient in the event of a mental health crisis (suicidal ideations/suicide attempt).  With written consent from the patient, the family member/significant other has been provided the following suicide prevention education, prior to the and/or following the discharge of the patient.  Sister said that patient will come to her home upon discharge.  Sister said they don't have any guns or weapons.  Sister said that mother is patient's medical power of attorney, but not his legal guardian.  The suicide prevention education provided includes the following: Suicide risk factors Suicide prevention and interventions National Suicide Hotline telephone number [facility] assessment telephone number [facility] Emergency Assistance 911 [facility] and/or Residential Mobile Crisis Unit telephone number  Request made of family/significant other to: Remove weapons (e.g., guns, rifles, knives), all items previously/currently identified as safety concern.   Remove drugs/medications (over-the-counter, prescriptions, illicit drugs), all items previously/currently identified as a safety concern.  The family member/significant other verbalizes understanding of the suicide prevention education information provided.  The family member/significant other agrees to remove the items of safety concern listed above.  Jalayna Josten O Keiosha Cancro, LCSWA 11/14/2023, 11:46 AM

## 2023-11-14 NOTE — Progress Notes (Signed)
(  Sleep Hours) - 8.75 (Any PRNs that were needed, meds refused, or side effects to meds)- N/A (Any disturbances and when (visitation, over night)- N/A  (Concerns raised by the patient)- N/A  (SI/HI/AVH)- DENIES

## 2023-11-14 NOTE — [facility] Group Notes (Signed)
 The focus of this group is to help patients review their daily goal of treatment and discuss progress on daily workbooks.     Scale 1-10  8     What was your goal today and did you accomplish it? Try and read  a book; Will read tonight

## 2023-11-14 NOTE — Plan of Care (Signed)
   Problem: Activity: Goal: Interest or engagement in activities will improve Outcome: Progressing   Problem: Health Behavior/Discharge Planning: Goal: Compliance with treatment plan for underlying cause of condition will improve Outcome: Progressing   Problem: Safety: Goal: Periods of time without injury will increase Outcome: Progressing

## 2023-11-15 NOTE — Discharge Instructions (Signed)

## 2023-11-15 NOTE — Hospital Course (Addendum)
 During the patient's hospitalization, patient had extensive initial psychiatric evaluation, and follow-up psychiatric evaluations every day.  Psychiatric diagnoses provided upon initial assessment:  Schizophrenia  Cannabis Use (per UDS)  Patient's psychiatric medications were adjusted on admission:  -- Discontinued Zyprexa  5 mg twice daily for psychosis  --Started Risperdal  1 mg twice daily for psychosis --As needed agitation protocol ordered, Zyprexa  ordered and discontinued Haldol  given patient prior history of acute dystonic reaction with Haldol  -- Started Hydroxyzine  25 mg three times daily as needed for anxiety  -- Started Trazodone  50 mg at bedtime as needed for insomnia   During the hospitalization, other adjustments were made to the patient's psychiatric medication regimen:  -- Increased Risperdal  2 mg twice daily for psychosis,which was later discontinued on 8/28 after receiving 2nd LAI injection  -- Administered Invega  Sustena 234 mg dose 8/25, and 156 mg 8/28  Patient's care was discussed during the interdisciplinary team meeting every day during the hospitalization.  The patient denied having side effects to prescribed psychiatric medication.  Gradually, patient started adjusting to milieu. The patient was evaluated each day by a clinical provider to ascertain response to treatment. Improvement was noted by the patient's report of decreasing symptoms, improved sleep and appetite, affect, medication tolerance, behavior, and participation in unit programming.  Patient was asked each day to complete a self inventory noting mood, mental status, pain, new symptoms, anxiety and concerns.    Symptoms were reported as significantly decreased or resolved completely by discharge.   On day of discharge, the patient reports that their mood is stable. The patient denied having suicidal thoughts for more than 48 hours prior to discharge.  Patient denies having homicidal thoughts.  Patient  denies having auditory hallucinations.  Patient denies any visual hallucinations or other symptoms of psychosis. Patient reports that prior to hospitalization he was in denial and was smoking marijuana to help his situation. Sometimes its difficult to realize when you need help. Thanks provider for helping him through hospitalization. The patient was motivated to continue taking medication with a goal of continued improvement in mental health.   The patient reports their target psychiatric symptoms of auditory/visual hallucinations responded well to the psychiatric medications, and the patient reports overall benefit other psychiatric hospitalization. Supportive psychotherapy was provided to the patient. The patient also participated in regular group therapy while hospitalized. Coping skills, problem solving as well as relaxation therapies were also part of the unit programming.  Labs were reviewed with the patient, and abnormal results were discussed with the patient.  The patient is able to verbalize their individual safety plan to this provider.  # It is recommended to the patient to continue psychiatric medications as prescribed, after discharge from the hospital.    # It is recommended to the patient to follow up with your outpatient psychiatric provider and PCP.  # It was discussed with the patient, the impact of alcohol, drugs, tobacco have been there overall psychiatric and medical wellbeing, and total abstinence from substance use was recommended the patient.ed.  # Prescriptions provided or sent directly to preferred pharmacy at discharge. Patient agreeable to plan. Given opportunity to ask questions. Appears to feel comfortable with discharge.    # In the event of worsening symptoms, the patient is instructed to call the crisis hotline, 911 and or go to the nearest ED for appropriate evaluation and treatment of symptoms. To follow-up with primary care provider for other medical issues,  concerns and or health care needs  # Patient was  discharged home with a plan to follow up as noted below.

## 2023-11-15 NOTE — Progress Notes (Addendum)
(  Sleep Hours) - 7 (Any PRNs that were needed, meds refused, or side effects to meds)- N/A (Any disturbances and when (visitation, over night)- N/A (Concerns raised by the patient)- N/A  (SI/HI/AVH)- DENIES

## 2023-11-15 NOTE — Plan of Care (Signed)
   Problem: Education: Goal: Emotional status will improve Outcome: Progressing Goal: Mental status will improve Outcome: Progressing Goal: Verbalization of understanding the information provided will improve Outcome: Progressing   Problem: Activity: Goal: Interest or engagement in activities will improve Outcome: Progressing

## 2023-11-15 NOTE — [facility] Group Notes (Signed)
 Adult Psychoeducational Group Note  Date:  11/15/2023 Time:  8:30 PM  Group Topic/Focus:  Wrap-Up Group:   The focus of this group is to help patients review their daily goal of treatment and discuss progress on daily workbooks.  Participation Level:  Active  Participation Quality:  Appropriate  Affect:  Appropriate  Cognitive:  Appropriate  Insight: Appropriate  Engagement in Group:  Engaged  Modes of Intervention:  Discussion  Additional Comments:  Pt attended group.  Drue Pouch 11/15/2023, 8:30 PM

## 2023-11-15 NOTE — Discharge Summary (Signed)
 Physician Discharge Summary Note  Patient:  Garrett Bolton is an 23 y.o., male MRN:  [id number] DOB:  04-Feb-2001 Patient phone:  [phone] (home)  Patient address:   [address] [place] [address],  Total Time spent with patient: {Time; 15 min - 8 hours:17441}  Date of Admission:  11/06/2023 Date of Discharge: 11/16/2023***  Reason for Admission:   Garrett Bolton is a 23 y.o. male with a past psychiatric history of schizophrenia, unspecific schizophrenia spectrum disorder, substance induced psychosis, history of cannabis use who presents involuntarily to [facility] from [facility] emergency department due to thought blocking, paranoia, disorganization and erratic behavior in the setting of medication noncompliance.  He was transferred here for symptom stabilization and medication management.   On intake exam, patient was interviewed in the hallway alongside chief resident psychiatrist.  The patient was calm, cooperative and attempted to answer questions in the interview. Patient is disorganized when attempting to answer questions and suspects he is here because he could not walk due to blisters on his feet. The patient was dressed bizarrely with his hospital scrubs over top of a button-down shirt. The patient did appear to be paranoid and was responding to internal stimuli throughout the interview, with intermittent moments of standing up and looking around towards the ceiling.  Patient reports in the past he has been on risperidone  and took last week. Patient felt symptoms were controlled, however is unable to disclose symptoms that were being controlled by medications.   Patient states that he is waiting for his mom to come and pick him up.  He denies any mood symptoms suggestive of depression or anxiety.  He denies a history of trauma. He consents to contact for further collateral.    Attempted to call mother, Garrett Bolton [phone] at 13:29 11/07/2023 and got the voicemail   Garrett Bolton  re-attempt later for collateral/updates    PER Chart Review from consultant psychiatrist on 8/19    Relevant Aspects of Hospital ED Course:  Admitted on 11/05/2023  for foot pain, noted to have multiple blisters left foot.  Reportedly presented with erratic behavior, pressured speech, flight of ideas, resistant to medical treatment.  Mother is listed as legal guardian.[name] He carries the psychiatric diagnoses of paranoid schizophrenia and cannabis use disorder and has a past medical history of anemia and sickle cell trait.    Patient Report:  Its my feet   Garrett  Moore, NP, initial psychiatry consult note, pt is poor historian, noted to be responding to internal stimuli, eyes darting around room, smiling inappropriately, thought blocking noted; he was able to tell me he called EMS due to his feet hurting, reports he has been on the street for 2 weeks; cannot tell me where he was living prior, only that he knew something happened with the baby; he then talks about having a dog that he wasn't allowed to have but wanted to care for the dog, he cannot tell me where the dog is currently located; he reports not eating much, reports little access to food, stated was sleeping 7 hours,  denied symptoms of depression denied anergia, anhedonia, amotivation, denied anxiety, no reported panic symptoms, no reported obsessive/compulsive behaviors. Client denies active SI/HI ideations, plans or intent. There is  evidence of  acute psychosis and paranoid thinking.  Client denied past episodes of hypomania, hyperactivity, erratic/excessive spending, involvement in dangerous activities, self-inflated ego, grandiosity, or promiscuity.   Reviewed active medication list/reviewed labs. Obtained Collateral information from medical record.  Attempted to reach  mother Garrett Bolton [phone] who is listed as legal guardian but unclear is this was from his minor status and not been updated or this is legally appointed as adult;  received voicemail, no message left    Attempt to reach sister Garrett Bolton [phone]  received voicemail, no message left    Per Lavanda Lesches, PA-C, EDP on admission, Garrett Bolton is a 23 y.o. male with a past medical history of schizophrenia who presents to the emergency department with complaint of bilateral foot pain.  Patient is a difficult historian at this time given his schizophrenia but he is able to tell me that he has been walking a lot and thinks that is why his feet hurt.  He allows me to examine only 1 slight, his right foot.  I am unable to get much history elsewise.   Psych ROS:  Depression: Unable to answer due to patient's acuity-thought blocking Anxiety:  Unable to answer due to patient's acuity-thought blocking Mania (lifetime and current): Unable to answer due to patient's acuity-thought blocking Psychosis: (lifetime and current): Per chart-yes   Collateral information:  Attempted to contact Garrett Bolton (sister)  [phone] -unsuccessful  SISTER RETURNED CALL AND PROVIDED CORRECT NUMBER FOR Garrett Bolton [phone]. Garrett Bolton UPDATE CHART    CALL CONTACT Garrett Bolton (MOTHER) [phone]-reports patient is currently homeless.  She sees them from time to time when she is able to catch up with him.  Last week she saw him and try to get him to eat and to shower but he would not.  States he was having a hard time comprehending what she was saying.  States there was a time in patient's past when he functioned normally.  However once he started abusing drugs as when he changed.  There is a history of bipolar and schizophrenia in paternal great grandparents.  She is unsure if patient is taking medications.  She is not technically his legal guardian.  She has healthcare power of attorney.  She has not presented to the court to petition for guardianship at this time.  She is going to contact Department of Social Services to try to determine what would be needed for patient to receive a disability  check as he is unable to work due to his mental disability.  She has also tried to find him places to live.  She is not aware of resources in the area.  Instructed her to contact insurance company care coordinator to see if that person would be of help and to also contact DSS.     Associated Signs/Symptoms: Depression Symptoms:   Denies (Hypo) Manic Symptoms:  Denies Anxiety Symptoms:  Denies Psychotic Symptoms:  Hallucinations: Visual Paranoia, patient denies symptoms however was responding to internal stimuli with moments of inappropriate laughter, though blocking and eyes scanning the hallway throughout interview PTSD Symptoms: Denies   Principal Problem: Schizophrenia, unspecified (HCC) Discharge Diagnoses: Principal Problem:   Schizophrenia, unspecified (HCC) Active Problems:   Episodic cannabis use   Past Psychiatric History:  Previous Psych Diagnoses: schizophrenia, unspecific schizophrenia spectrum disorder, substance induced psychosis, history of cannabis use Prior inpatient treatment: Multiple, 03/2019, 04/2019, 04/2019, 02/2020, 12/2020 and per chart review last hospitalization was with old [facility] in 2023 Current/prior outpatient treatment: Yes, not currently following with psychiatry Prior rehab hx: Unable to assess due to disorganization Psychotherapy hx: Unable to assess due to disorganization History of suicide: Unable to assess due to disorganization History of homicide or aggression: Unable to assess due to disorganization Psychiatric medication history: Per chart review Invega ,  Risperdal , Depakote , trazodone , hydroxyzine , benztropine , Haldol  Psychiatric medication compliance history: Noncompliance Neuromodulation history: Unaware Current Psychiatrist: Denies Current therapist: Denies  Past Medical History:  Past Medical History:  Diagnosis Date   Anemia    Sickle cell trait (HCC)     Past Surgical History:  Procedure Laterality Date   CIRCUMCISION  2001    LAPAROTOMY N/A 08/23/2019   Procedure: EXPLORATORY LAPAROTOMY;  Surgeon: Sebastian Moles, MD;  Location: [facility] OR;  Service: General;  Laterality: N/A;   Family History: History reviewed. No pertinent family history. Family Psychiatric  History:  Psych:depression on mother side,  bipolar and schizophrenia in paternal great grandparents per EMR  Psych Mk:Ejupzwu unable to disclose due to disorganization SA/HA:Patient unable to disclose due to disorganization Substance use family yk:Ejupzwu unable to disclose due to disorganization  Social History:  Social History   Substance and Sexual Activity  Alcohol Use Yes     Social History   Substance and Sexual Activity  Drug Use Yes   Types: Methamphetamines, Marijuana   Comment: Daily    Social History   Socioeconomic History   Marital status: Single    Spouse name: Not on file   Number of children: Not on file   Years of education: Not on file   Highest education level: Not on file  Occupational History   Not on file  Tobacco Use   Smoking status: Some Days    Current packs/day: 0.25    Average packs/day: 0.3 packs/day for 4.0 years (1.0 ttl pk-yrs)    Types: Cigars, Cigarettes   Smokeless tobacco: Never  Vaping Use   Vaping status: Never Used  Substance and Sexual Activity   Alcohol use: Yes   Drug use: Yes    Types: Methamphetamines, Marijuana    Comment: Daily   Sexual activity: Never  Other Topics Concern   Not on file  Social History Narrative   Not on file   Social Drivers of Health   Financial Resource Strain: Not on file  Food Insecurity: Food Insecurity Present (11/06/2023)   Hunger Vital Sign    Worried About Running Out of Food in the Last Year: Often true    Ran Out of Food in the Last Year: Often true  Transportation Needs: Unmet Transportation Needs (11/06/2023)   PRAPARE - Administrator, Civil Service (Medical): Yes    Lack of Transportation (Non-Medical): Yes  Physical Activity: Not on file   Stress: Not on file  Social Connections: Not on file    Hospital Course:   Patient's psychiatric medications were adjusted on admission:  -- Discontinued Zyprexa  5 mg twice daily for psychosis  --Started Risperdal  1 mg twice daily for psychosis --As needed agitation protocol ordered, Zyprexa  ordered and discontinued Haldol  given patient prior history of acute dystonic reaction with Haldol  -- Started Hydroxyzine  25 mg three times daily as needed for anxiety  -- Started Trazodone  50 mg at bedtime as needed for insomnia   During the hospitalization, other adjustments were made to the patient's psychiatric medication regimen:  -- Increased Risperdal  2 mg twice daily for psychosis,which was later discontinued on 8/28 after receiving 2nd LAI injection  -- Administered Invega  Sustena 234 mg dose 8/25, and 156 mg 8/28   Patient's care was discussed during the interdisciplinary team meeting every day during the hospitalization.   The patient denied having side effects to prescribed psychiatric medication.   Gradually, patient started adjusting to milieu. The patient was evaluated each day by a clinical  provider to ascertain response to treatment. Improvement was noted by the patient's report of decreasing symptoms, improved sleep and appetite, affect, medication tolerance, behavior, and participation in unit programming.  Patient was asked each day to complete a self inventory noting mood, mental status, pain, new symptoms, anxiety and concerns.     Symptoms were reported as significantly decreased or resolved completely by discharge.    On day of discharge, the patient reports that their mood is stable. The patient denied having suicidal thoughts for more than 48 hours prior to discharge.  Patient denies having homicidal thoughts.  Patient denies having auditory hallucinations.  Patient denies any visual hallucinations or other symptoms of psychosis. Patient reports that prior to hospitalization he  was in denial and was smoking marijuana to help his situation. Sometimes its difficult to realize when you need help. Thanks provider for helping him through hospitalization. The patient was motivated to continue taking medication with a goal of continued improvement in mental health.    The patient reports their target psychiatric symptoms of auditory/visual hallucinations responded well to the psychiatric medications, and the patient reports overall benefit other psychiatric hospitalization. Supportive psychotherapy was provided to the patient. The patient also participated in regular group therapy while hospitalized. Coping skills, problem solving as well as relaxation therapies were also part of the unit programming.   Labs were reviewed with the patient, and abnormal results were discussed with the patient.   The patient is able to verbalize their individual safety plan to this provider.   # It is recommended to the patient to continue psychiatric medications as prescribed, after discharge from the hospital.     # It is recommended to the patient to follow up with your outpatient psychiatric provider and PCP.   # It was discussed with the patient, the impact of alcohol, drugs, tobacco have been there overall psychiatric and medical wellbeing, and total abstinence from substance use was recommended the patient.ed.   # Prescriptions provided or sent directly to preferred pharmacy at discharge. Patient agreeable to plan. Given opportunity to ask questions. Appears to feel comfortable with discharge.    # In the event of worsening symptoms, the patient is instructed to call the crisis hotline, 911 and or go to the nearest ED for appropriate evaluation and treatment of symptoms. To follow-up with primary care provider for other medical issues, concerns and or health care needs   # Patient was discharged home with a plan to follow up as noted below.     Physical Findings: AIMS:  , ,  ,  ,  ,   ,   CIWA:    COWS:     Musculoskeletal: Strength & Muscle Tone: within normal limits Gait & Station: normal Patient leans: N/A   Psychiatric Specialty Exam:  Presentation  General Appearance:  Appropriate for Environment; Casual  Eye Contact: Good  Speech: Clear and Coherent; Normal Rate  Speech Volume: Normal  Handedness: Right   Mood and Affect  Mood: Euthymic  Affect: Appropriate; Congruent   Thought Process  Thought Processes: Coherent; Linear; Goal Directed  Descriptions of Associations:Intact  Orientation:Full (Time, Place and Person)  Thought Content:Logical  History of Schizophrenia/Schizoaffective disorder:Yes  Duration of Psychotic Symptoms:Greater than six months  Hallucinations:Hallucinations: None  Ideas of Reference:None  Suicidal Thoughts:Suicidal Thoughts: No  Homicidal Thoughts:Homicidal Thoughts: No   Sensorium  Memory: Immediate Good  Judgment: Good  Insight: Good   Executive Functions  Concentration: Good  Attention Span: Good  Recall: Fair  Fund of Knowledge: Good  Language: Good   Psychomotor Activity  Psychomotor Activity: Psychomotor Activity: Normal   Assets  Assets: Communication Skills; Desire for Improvement; Housing; Social Support   Sleep  Sleep: Sleep: Good  Estimated Sleeping Duration (Last 24 Hours): 5.00-6.25 hours   Physical Exam: Physical Exam ROS Blood pressure 103/66, pulse 86, temperature 98.1 F (36.7 C), temperature source Oral, resp. rate 16, height 5' 10 (1.778 m), weight 55.3 kg, SpO2 100%. Body mass index is 17.51 kg/m.   Social History   Tobacco Use  Smoking Status Some Days   Current packs/day: 0.25   Average packs/day: 0.3 packs/day for 4.0 years (1.0 ttl pk-yrs)   Types: Cigars, Cigarettes  Smokeless Tobacco Never   Tobacco Cessation:  N/A, patient does not currently use tobacco products   Blood Alcohol level:  Lab Results  Component Value  Date   [facility] <15 11/05/2023   ETH <10 08/14/2021    Metabolic Disorder Labs:  Lab Results  Component Value Date   HGBA1C 4.5 (L) 11/08/2023   MPG 82.45 11/08/2023   MPG 79.58 08/14/2021   Lab Results  Component Value Date   PROLACTIN 15.2 04/21/2019   Lab Results  Component Value Date   CHOL 120 11/08/2023   TRIG 56 11/08/2023   HDL 56 11/08/2023   CHOLHDL 2.1 11/08/2023   VLDL 11 11/08/2023   LDLCALC 53 11/08/2023   LDLCALC 90 01/01/2021    See Psychiatric Specialty Exam and Suicide Risk Assessment completed by Attending Physician prior to discharge.  Discharge destination:  Home  Is patient on multiple antipsychotic therapies at discharge:  No   Has Patient had three or more failed trials of antipsychotic monotherapy by history:  No  Recommended Plan for Multiple Antipsychotic Therapies: NA   Allergies as of 11/15/2023       Reactions   Penicillins      Med Rec must be completed prior to using this [facility]***       Follow-up Information     [facility] Go on 11/22/2023.   Why: Please go to this provider on 11/22/23 at 1:00 pm for an assessment, to obtain therapy and medication management services. Contact information: [address] [place] [address] [phone]                 Follow-up recommendations:    ctivity: as tolerated   Diet: heart healthy   Other: -Follow-up with your outpatient psychiatric provider -instructions on appointment date, time, and address (location) are provided to you in discharge paperwork.   -Take your psychiatric medications as prescribed at discharge - instructions are provided to you in the discharge paperwork   -Follow-up with outpatient primary care doctor and other specialists -for management of preventative medicine and chronic medical disease   -Testing: Follow-up with outpatient provider for labs:  - 8/19: Lipid panel WNL LDL 53 and cholesterol 120, A1c 4.5   -If you are  prescribed an atypical antipsychotic medication, we recommend that your outpatient psychiatrist follow routine screening for side effects within 3 months of discharge, including monitoring: AIMS scale, height, weight, blood pressure, fasting lipid panel, HbA1c, and fasting blood sugar.    -Recommend total abstinence from alcohol, tobacco, and other illicit drug use at discharge.    -If your psychiatric symptoms recur, worsen, or if you have side effects to your psychiatric medications, call your outpatient psychiatric provider, 911, [phone] or go to the nearest emergency department.   -If suicidal  thoughts occur, immediately call your outpatient psychiatric provider, 911, [phone] or go to the nearest emergency department.  Signed: PATTI OLDEN, MD 11/15/2023, 2:06 PM

## 2023-11-15 NOTE — Group Note (Signed)
 Recreation Therapy Group Note   Group Topic:Healthy Decision Making  Group Date: 11/15/2023 Start Time: 1005 End Time: 1025 Facilitators: Sommer Spickard-McCall, LRT,CTRS Location: [address] Dayroom   Group Topic: Decision Making, Problem Solving, Communication  Goal Area(s) Addresses:  Patient will effectively work with peer towards shared goal.  Patient will identify factors that guided their decision making.  Patient will pro-socially communicate ideas during group session.   Behavioral Response:   Intervention: Survival Scenario - pencil, paper  Activity: Patients were given a scenario that they were going to be stranded on a deserted [place] for several months before being rescued. Writer tasked them with making a list of 15 things they would choose to bring with them for survival. The list of items was prioritized most important to least. Each patient would come up with their own list, then work together to create a new list of 15 items while in a group of 3-5 peers. LRT discussed each person's list and how it differed from others. The debrief included discussion of priorities, good decisions versus bad decisions, and how it is important to think before acting so we can make the best decision possible. LRT tied the concept of effective communication among group members to patient's support systems outside of the hospital and its benefit post discharge.  Education: Pharmacist, community, Priorities, Counsellor, Discharge Planning   Education Outcome: Acknowledges education/In group clarification/Needs additional education   Affect/Mood: Appropriate   Participation Level: Engaged   Participation Quality: Independent   Behavior: Appropriate   Speech/Thought Process: Delusional   Insight: Fair   Judgement: Fair    Modes of Intervention: Decision Making   Patient Response to Interventions:  Engaged   Education Outcome:  In group clarification offered    Clinical  Observations/Individualized Feedback: Pt was bright and engaged in group. In completing his list, pt identified all food items (mashed potatoes, gravy, green beans, hot cheetos, onions, water , liver (chicken), etc). The only non food items identified were water  and hangers. When pt was pressed about his answers such as potatoes, pt he could use that eat as well as catch fish. It was brought to pt attention that he didn't identify anything he could use to cook/catch his food. During discussion, pt stated he usually makes good decisions off instincts. Pt used the example of dealing with something surrounding his apartment. Pt shared he decided to come to the hospital while he waits for his apartment to get ready. Pt was attentive and interactive during group.    Plan: Continue to engage patient in RT group sessions 2-3x/week.   Lilyanne Mcquown-McCall, LRT,CTRS 11/15/2023 12:14 PM

## 2023-11-15 NOTE — Progress Notes (Signed)
   11/15/23 2100  Psych Admission Type (Psych Patients Only)  Admission Status Involuntary  Psychosocial Assessment  Patient Complaints None  Eye Contact Fair  Facial Expression Fixed smile  Affect Appropriate to circumstance  Speech Logical/coherent;Soft  Interaction Childlike;Cautious  Motor Activity Slow  Appearance/Hygiene Unremarkable  Behavior Characteristics Cooperative  Mood Pleasant  Thought Process  Coherency WDL  Content WDL  Delusions None reported or observed  Perception WDL  Hallucination None reported or observed  Judgment Limited  Confusion None  Danger to Self  Current suicidal ideation? Denies  Agreement Not to Harm Self Yes  Description of Agreement Verbal  Danger to Others  Danger to Others None reported or observed

## 2023-11-15 NOTE — Group Note (Signed)
 Date:  11/15/2023 Time:  3:41 PM  Group Topic/Focus:  Goals Group:   The focus of this group is to help patients establish daily goals to achieve during treatment and discuss how the patient can incorporate goal setting into their daily lives to aide in recovery. Orientation:   The focus of this group is to educate the patient on the purpose and policies of crisis stabilization and provide a format to answer questions about their admission.  The group details unit policies and expectations of patients while admitted.    Participation Level:  Active  Participation Quality:  Appropriate  Affect:  Appropriate  Cognitive:  Appropriate  Insight: Improving  Engagement in Group:  Improving  Modes of Intervention:  Discussion  Additional Comments:  Pt goal is to work on his discharge plan.   Garrett Bolton [name] 11/15/2023, 3:41 PM

## 2023-11-15 NOTE — Progress Notes (Signed)
 [facility] MD Progress Note  11/15/2023 9:58 AM Qadir Folks  MRN:  [id number] Subjective:   Kiefer Opheim is a 23 y.o. male with a past psychiatric history of schizophrenia, unspecific schizophrenia spectrum disorder, substance induced psychosis, history of cannabis use who presents involuntarily to [facility] from [facility] emergency department due to thought blocking, paranoia, disorganization and erratic behavior in the setting of medication noncompliance.  He was transferred here for symptom stabilization and medication management. Denies issues with sleep or appetite.   24 hour events:  (Sleep Hours) - 7 (Any PRNs that were needed, meds refused, or side effects to meds)- N/A (Any disturbances and when (visitation, over night)- N/A            (Concerns raised by the patient)- N/A  (SI/HI/AVH)- DENIES   Patient interview:  Patient continues to be clear and organized with answering providers questions.  Denies any depressive symptoms, anxiety, suicidal ideations auditory hallucinations or visual hallucinations or homicidal ideations.  Patient reports that prior to hospitalization he was in denial and was smoking marijuana to help his situation. Sometimes its difficult to realize when you need help. Thanks provider for helping him through hospitalization.  Patient is motivated to continue taking medications, no issues noted after taking administration of long-acting injectable this morning.  No additional complaints noted  Collateral, [name] Ahumada, [phone] 1521 8/27 Patient granted consent to speak with sister about discharge planning and about hospital course without restrictions.  Sister states that she can come pick Dewan up at 1 PM on Friday after discharge.  Discussed medication regimen and administration of long-acting injectable has shown notable improvements in patient interactions. She reports compliance has been an issue with him before in the past and she has had to IVC him  multiple times previously due to it.  Reports that in prior instances he has been disorganized and not making sense. Discussed follow-up appointments. Opportunity to ask additional questions was given.   Principal Problem: Schizophrenia, unspecified (HCC) Diagnosis: Principal Problem:   Schizophrenia, unspecified (HCC) Active Problems:   Episodic cannabis use  Total Time spent with patient: 30 minutes  Past Psychiatric History:   Previous Psych Diagnoses: schizophrenia, unspecific schizophrenia spectrum disorder, substance induced psychosis, history of cannabis use Prior inpatient treatment: Multiple, 03/2019, 04/2019, 04/2019, 02/2020, 12/2020 and per chart review last hospitalization was with old Norbert in 2023 Current/prior outpatient treatment: Yes, not currently following with psychiatry Prior rehab hx: Unable to assess due to disorganization Psychotherapy hx: Unable to assess due to disorganization History of suicide: Unable to assess due to disorganization History of homicide or aggression: Unable to assess due to disorganization Psychiatric medication history: Per chart review Invega , Risperdal , Depakote , trazodone , hydroxyzine , benztropine , Haldol  Psychiatric medication compliance history: Noncompliance Neuromodulation history: Unaware Current Psychiatrist: Denies Current therapist: Denies  Past Medical History:  Past Medical History:  Diagnosis Date   Anemia    Sickle cell trait (HCC)     Past Surgical History:  Procedure Laterality Date   CIRCUMCISION  2001   LAPAROTOMY N/A 08/23/2019   Procedure: EXPLORATORY LAPAROTOMY;  Surgeon: Sebastian Moles, MD;  Location: [facility] OR;  Service: General;  Laterality: N/A;   Family History: History reviewed. No pertinent family history. Family Psychiatric  History:  Psych:depression on mother side,  bipolar and schizophrenia in paternal great grandparents per EMR  Psych Mk:Ejupzwu unable to disclose due to disorganization SA/HA:Patient  unable to disclose due to disorganization Substance use family yk:Ejupzwu unable to disclose due to disorganization  Social  History:  Social History   Substance and Sexual Activity  Alcohol Use Yes     Social History   Substance and Sexual Activity  Drug Use Yes   Types: Methamphetamines, Marijuana   Comment: Daily    Social History   Socioeconomic History   Marital status: Single    Spouse name: Not on file   Number of children: Not on file   Years of education: Not on file   Highest education level: Not on file  Occupational History   Not on file  Tobacco Use   Smoking status: Some Days    Current packs/day: 0.25    Average packs/day: 0.3 packs/day for 4.0 years (1.0 ttl pk-yrs)    Types: Cigars, Cigarettes   Smokeless tobacco: Never  Vaping Use   Vaping status: Never Used  Substance and Sexual Activity   Alcohol use: Yes   Drug use: Yes    Types: Methamphetamines, Marijuana    Comment: Daily   Sexual activity: Never  Other Topics Concern   Not on file  Social History Narrative   Not on file   Social Drivers of Health   Financial Resource Strain: Not on file  Food Insecurity: Food Insecurity Present (11/06/2023)   Hunger Vital Sign    Worried About Running Out of Food in the Last Year: Often true    Ran Out of Food in the Last Year: Often true  Transportation Needs: Unmet Transportation Needs (11/06/2023)   PRAPARE - Administrator, Civil Service (Medical): Yes    Lack of Transportation (Non-Medical): Yes  Physical Activity: Not on file  Stress: Not on file  Social Connections: Not on file   Additional Social History:                         Sleep: Good Estimated Sleeping Duration (Last 24 Hours): 5.00-6.25 hours  Appetite:  Good  Current Medications: Current Facility-Administered Medications  Medication Dose Route Frequency Provider Last Rate Last Admin   acetaminophen  (TYLENOL ) tablet 650 mg  650 mg Oral Q6H PRN Coleman,  Carolyn H, NP       alum & mag hydroxide-simeth (MAALOX/MYLANTA) 200-200-20 MG/5ML suspension 30 mL  30 mL Oral Q4H PRN Mardy Elveria DEL, NP       feeding supplement (ENSURE PLUS HIGH PROTEIN) liquid 237 mL  237 mL Oral BID BM Towana Leita SAILOR, MD   237 mL at 11/13/23 1500   hydrOXYzine  (ATARAX ) tablet 25 mg  25 mg Oral TID PRN Coleman, Carolyn H, NP   25 mg at 11/11/23 2000   magnesium  hydroxide (MILK OF MAGNESIA) suspension 30 mL  30 mL Oral Daily PRN Mardy Elveria DEL, NP       OLANZapine  (ZYPREXA ) injection 10 mg  10 mg Intramuscular TID PRN Lenard Calin, MD       OLANZapine  (ZYPREXA ) injection 5 mg  5 mg Intramuscular TID PRN Lenard Calin, MD       OLANZapine  zydis (ZYPREXA ) disintegrating tablet 5 mg  5 mg Oral TID PRN Lenard Calin, MD       risperiDONE  (RISPERDAL  M-TABS) disintegrating tablet 2 mg  2 mg Oral BID Lenard Calin, MD   2 mg at 11/15/23 9171   traZODone  (DESYREL ) tablet 50 mg  50 mg Oral QHS PRN Mardy Elveria DEL, NP   50 mg at 11/11/23 2000    Lab Results:  No results found for this or any previous visit (from the past  48 hours).   Blood Alcohol level:  Lab Results  Component Value Date   [facility] <15 11/05/2023   ETH <10 08/14/2021    Metabolic Disorder Labs: Lab Results  Component Value Date   HGBA1C 4.5 (L) 11/08/2023   MPG 82.45 11/08/2023   MPG 79.58 08/14/2021   Lab Results  Component Value Date   PROLACTIN 15.2 04/21/2019   Lab Results  Component Value Date   CHOL 120 11/08/2023   TRIG 56 11/08/2023   HDL 56 11/08/2023   CHOLHDL 2.1 11/08/2023   VLDL 11 11/08/2023   LDLCALC 53 11/08/2023   LDLCALC 90 01/01/2021    Physical Findings: AIMS:  ,  ,  ,  ,  ,  ,   CIWA:    COWS:     Musculoskeletal: Strength & Muscle Tone: within normal limits Gait & Station: normal Patient leans: N/A  Psychiatric Specialty Exam:  Presentation  General Appearance:  Appropriate for Environment; Casual  Eye Contact: Good  Speech: Clear and  Coherent; Normal Rate  Speech Volume: Normal  Handedness: Right   Mood and Affect  Mood: Euthymic  Affect: Appropriate; Congruent   Thought Process  Thought Processes: Coherent; Linear; Goal Directed  Descriptions of Associations:Intact  Orientation:Full (Time, Place and Person)  Thought Content:Logical  History of Schizophrenia/Schizoaffective disorder:Yes  Duration of Psychotic Symptoms:Greater than six months  Hallucinations:Hallucinations: None    Ideas of Reference:None  Suicidal Thoughts:Suicidal Thoughts: No    Homicidal Thoughts:Homicidal Thoughts: No     Sensorium  Memory: Immediate Good  Judgment: Good  Insight: Good   Executive Functions  Concentration: Good  Attention Span: Good  Recall: Fair  Fund of Knowledge: Good  Language: Good   Psychomotor Activity  Psychomotor Activity: Psychomotor Activity: Normal    Assets  Assets: Communication Skills; Desire for Improvement; Housing; Social Support   Sleep  Sleep: Sleep: Good   Physical Exam: Physical Exam Constitutional:      General: He is not in acute distress.    Appearance: He is not toxic-appearing or diaphoretic.  Eyes:     Conjunctiva/sclera: Conjunctivae normal.  Pulmonary:     Effort: Pulmonary effort is normal.  Musculoskeletal:        General: Normal range of motion.  Neurological:     General: No focal deficit present.     Mental Status: He is alert.    Review of Systems  Constitutional:  Negative for chills, diaphoresis and fever.  Respiratory:  Negative for cough.   Gastrointestinal:  Negative for nausea and vomiting.  Neurological:  Negative for headaches.  Psychiatric/Behavioral:  Negative for depression, hallucinations, substance abuse and suicidal ideas. The patient is not nervous/anxious and does not have insomnia.    Blood pressure 103/66, pulse 86, temperature 98.1 F (36.7 C), temperature source Oral, resp. rate 16, height  5' 10 (1.778 m), weight 55.3 kg, SpO2 100%. Body mass index is 17.51 kg/m.  Treatment Plan Summary: Fallon Howerter is a 23 y.o. male with a past psychiatric history of schizophrenia, unspecific schizophrenia spectrum disorder, substance induced psychosis, history of cannabis use who presents involuntarily to [facility] from [facility] emergency department due to thought blocking, paranoia, disorganization and erratic behavior in the setting of medication noncompliance.  He was transferred here for symptom stabilization and medication management.   On intake assessment, patient does appear to be psychotic given disorganization, responding to internal stimuli on the floor throughout interview and lack of insight of events leading up to hospitalization.  Patient  is calm and cooperative throughout interview and has not required any agitation protocol.  Patient reports that he was previously on risperidone  help control symptoms.  However per the pharmacy dispensed report patient has not recently had risperidone  filled (no record of dispenses since 04/2023). Patient has been hospitalized here multiple times before and seemed to do well on risperidone .  Will restart a low dose and continue to adjust to optimize symptom stabilization and hopes of transitioning to [facility]I.  We will call mother for further collateral after given patient's consent during intake assessment.  8/28:  On morning assessment, patient is linear, cooperative and goal oriented this morning.  Thanks provider for assistance during hospitalization and motivated to continue on medications post discharge.  Patient received long-acting injectable second dose this morning of Invega .  No complaints noted thus far.  Sister states she can pick patient up on 8/29 at 1 PM if he continues to be stable for discharge.   Diagnoses / Active Problems: Schizophrenia  Cannabis Use (per UDS)    PLAN: Safety and Monitoring:             --  Involuntary admission  to inpatient psychiatric unit for safety, stabilization and treatment             -- Daily contact with patient to assess and evaluate symptoms and progress in treatment             -- Patient's case to be discussed in multi-disciplinary team meeting             -- Observation Level : q15 minute checks             -- Vital signs:  q12 hours             -- Precautions: suicide, elopement, and assault   2. Psychiatric Diagnoses and Treatment: -- Discontinue Risperdal  2 mg twice daily for psychosis, will receive last dose tonight.  -- Administered Invega  Sustena 234 mg dose 8/25, and 156 mg 8/28 --As needed agitation protocol ordered, Zyprexa  ordered and discontinued Haldol  given patient prior history of acute dystonic reaction with Haldol  -- PRNs available: Detailed in the [facility] --  The risks/benefits/side-effects/alternatives to this medication were discussed in detail with the patient and time was given for questions. The patient consents to medication trial.              -- Metabolic profile and EKG monitoring obtained while on an atypical antipsychotic (BMI: Lipid Panel: HbgA1c: QTc:)             -- Labs unremarkable              -- Encouraged patient to participate in unit milieu and in scheduled group therapies              -- Short Term Goals: Ability to identify changes in lifestyle to reduce recurrence of condition will improve, Ability to maintain clinical measurements within normal limits will improve, Compliance with prescribed medications will improve, and Ability to identify triggers associated with substance abuse/mental health issues will improve             -- Long Term Goals: Improvement in symptoms so as ready for discharge   3. Medical Issues Being Addressed:              Cannabis Use              -- UDS positive for THC              --  Smoking cessation encouraged   4. Discharge Planning:              -- Social work and case management to assist with discharge planning and  identification of hospital follow-up needs prior to discharge             -- Estimated LOS: 7-14 days, Planning for 8/29              -- Discharge Concerns:Medication compliance and effectiveness             -- Discharge Goals: Return home with outpatient referrals for mental health follow-up including medication management/psychotherapy   I certify that inpatient services furnished can reasonably be expected to improve the patient's condition.    PATTI OLDEN, MD 11/15/2023, 9:58 AM

## 2023-11-15 NOTE — BHH Suicide Risk Assessment (Signed)
 [facility] Discharge Suicide Risk Assessment   Principal Problem: Schizophrenia, unspecified (HCC) Discharge Diagnoses: Principal Problem:   Schizophrenia, unspecified (HCC) Active Problems:   Episodic cannabis use  Garrett Bolton is a 23 y.o. male with a past psychiatric history of schizophrenia, unspecific schizophrenia spectrum disorder, substance induced psychosis, history of cannabis use who presents involuntarily to [facility] from [facility] emergency department due to thought blocking, paranoia, disorganization and erratic behavior in the setting of medication noncompliance.  He was transferred here for symptom stabilization and medication management.  During the patient's hospitalization, patient had extensive initial psychiatric evaluation, and follow-up psychiatric evaluations every day.  Psychiatric diagnoses provided upon initial assessment:  Schizophrenia  Cannabis Use (per UDS)  Patient's psychiatric medications were adjusted on admission:  -- Discontinued Zyprexa  5 mg twice daily for psychosis  --Started Risperdal  1 mg twice daily for psychosis --As needed agitation protocol ordered, Zyprexa  ordered and discontinued Haldol  given patient prior history of acute dystonic reaction with Haldol  -- Started Hydroxyzine  25 mg three times daily as needed for anxiety  -- Started Trazodone  50 mg at bedtime as needed for insomnia   During the hospitalization, other adjustments were made to the patient's psychiatric medication regimen:  -- Increased Risperdal  2 mg twice daily for psychosis,which was later discontinued on 8/28 after receiving 2nd LAI injection  -- Administered Invega  Sustena 234 mg dose 8/25, and 156 mg 8/28  Patient's care was discussed during the interdisciplinary team meeting every day during the hospitalization.  The patient denied having side effects to prescribed psychiatric medication.  Gradually, patient started adjusting to milieu. The patient was evaluated  each day by a clinical provider to ascertain response to treatment. Improvement was noted by the patient's report of decreasing symptoms, improved sleep and appetite, affect, medication tolerance, behavior, and participation in unit programming.  Patient was asked each day to complete a self inventory noting mood, mental status, pain, new symptoms, anxiety and concerns.    Symptoms were reported as significantly decreased or resolved completely by discharge.   On day of discharge, the patient reports that their mood is stable. The patient denied having suicidal thoughts for more than 48 hours prior to discharge.  Patient denies having homicidal thoughts.  Patient denies having auditory hallucinations.  Patient denies any visual hallucinations or other symptoms of psychosis. Patient reports that prior to hospitalization he was in denial and was smoking marijuana to help his situation. Sometimes its difficult to realize when you need help. Thanks provider for helping him through hospitalization. The patient was motivated to continue taking medication with a goal of continued improvement in mental health.   The patient reports their target psychiatric symptoms of auditory/visual hallucinations responded well to the psychiatric medications, and the patient reports overall benefit other psychiatric hospitalization. Supportive psychotherapy was provided to the patient. The patient also participated in regular group therapy while hospitalized. Coping skills, problem solving as well as relaxation therapies were also part of the unit programming.  Labs were reviewed with the patient, and abnormal results were discussed with the patient.  The patient is able to verbalize their individual safety plan to this provider.  # It is recommended to the patient to continue psychiatric medications as prescribed, after discharge from the hospital.    # It is recommended to the patient to follow up with your outpatient  psychiatric provider and PCP.  # It was discussed with the patient, the impact of alcohol, drugs, tobacco have been there overall psychiatric and medical  wellbeing, and total abstinence from substance use was recommended the patient.ed.  # Prescriptions provided or sent directly to preferred pharmacy at discharge. Patient agreeable to plan. Given opportunity to ask questions. Appears to feel comfortable with discharge.    # In the event of worsening symptoms, the patient is instructed to call the crisis hotline, 911 and or go to the nearest ED for appropriate evaluation and treatment of symptoms. To follow-up with primary care provider for other medical issues, concerns and or health care needs  # Patient was discharged home with a plan to follow up as noted below.  Total Time spent with patient: {Time; 15 min - 8 hours:17441}  Musculoskeletal: Strength & Muscle Tone: within normal limits Gait & Station: normal Patient leans: N/A  Psychiatric Specialty Exam  Presentation  General Appearance:  Appropriate for Environment; Casual  Eye Contact: Good  Speech: Clear and Coherent; Normal Rate  Speech Volume: Normal  Handedness: Right   Mood and Affect  Mood: Euthymic  Duration of Depression Symptoms: No data recorded Affect: Appropriate; Congruent   Thought Process  Thought Processes: Coherent; Linear; Goal Directed  Descriptions of Associations:Intact  Orientation:Full (Time, Place and Person)  Thought Content:Logical  History of Schizophrenia/Schizoaffective disorder:Yes  Duration of Psychotic Symptoms:Greater than six months  Hallucinations:Hallucinations: None  Ideas of Reference:None  Suicidal Thoughts:Suicidal Thoughts: No  Homicidal Thoughts:Homicidal Thoughts: No   Sensorium  Memory: Immediate Good  Judgment: Good  Insight: Good   Executive Functions  Concentration: Good  Attention Span: Good  Recall: Fair  Fund of  Knowledge: Good  Language: Good   Psychomotor Activity  Psychomotor Activity: Psychomotor Activity: Normal   Assets  Assets: Communication Skills; Desire for Improvement; Housing; Social Support   Sleep  Sleep: Sleep: Good  Estimated Sleeping Duration (Last 24 Hours): 5.00-6.25 hours  Physical Exam: Physical Exam ROS Blood pressure 103/66, pulse 86, temperature 98.1 F (36.7 C), temperature source Oral, resp. rate 16, height 5' 10 (1.778 m), weight 55.3 kg, SpO2 100%. Body mass index is 17.51 kg/m.  Mental Status Per Nursing Assessment::   On Admission:  NA  Demographic Factors:  Male, Adolescent or young adult, Low socioeconomic status, and Unemployed  Loss Factors: Decrease in vocational status and Financial problems/change in socioeconomic status  Historical Factors: Family history of mental illness or substance abuse  Risk Reduction Factors:   Religious beliefs about death, Living with another person, especially a relative, and Positive social support  Continued Clinical Symptoms:  Mood symptoms were stable  Patient denied auditory and visual hallucinations for several days, he was also cooperative and engaged throughout the milleu and not seen overtly responding to internal stimuli   Cognitive Features That Contribute To Risk:  None    Suicide Risk:  Minimal: No identifiable suicidal ideation.  Patients presenting with no risk factors but with morbid ruminations; may be classified as minimal risk based on the severity of the depressive symptoms   Follow-up Information     [facility] Go on 11/22/2023.   Why: Please go to this provider on 11/22/23 at 1:00 pm for an assessment, to obtain therapy and medication management services. Contact information: [address] [place] [address] [phone]                 Plan Of Care/Follow-up recommendations:   Activity: as tolerated  Diet: heart  healthy  Other: -Follow-up with your outpatient psychiatric provider -instructions on appointment date, time, and address (location) are provided to  you in discharge paperwork.  -Take your psychiatric medications as prescribed at discharge - instructions are provided to you in the discharge paperwork  -Follow-up with outpatient primary care doctor and other specialists -for management of preventative medicine and chronic medical disease  -Testing: Follow-up with outpatient provider for labs:  - 8/19: Lipid panel WNL LDL 53 and cholesterol 120, A1c 4.5  -If you are prescribed an atypical antipsychotic medication, we recommend that your outpatient psychiatrist follow routine screening for side effects within 3 months of discharge, including monitoring: AIMS scale, height, weight, blood pressure, fasting lipid panel, HbA1c, and fasting blood sugar.   -Recommend total abstinence from alcohol, tobacco, and other illicit drug use at discharge.   -If your psychiatric symptoms recur, worsen, or if you have side effects to your psychiatric medications, call your outpatient psychiatric provider, 911, [phone] or go to the nearest emergency department.  -If suicidal thoughts occur, immediately call your outpatient psychiatric provider, 911, [phone] or go to the nearest emergency department.   PATTI OLDEN, MD 11/15/2023, 2:06 PM

## 2023-11-15 NOTE — Progress Notes (Signed)
   11/15/23 0900  Psych Admission Type (Psych Patients Only)  Admission Status Involuntary  Psychosocial Assessment  Patient Complaints None  Eye Contact Fair  Facial Expression Fixed smile  Affect Appropriate to circumstance  Speech Logical/coherent;Soft  Interaction Cautious;Childlike  Motor Activity Slow  Appearance/Hygiene Unremarkable  Behavior Characteristics Cooperative  Mood Pleasant  Thought Process  Coherency WDL  Content WDL  Delusions None reported or observed  Perception WDL  Hallucination None reported or observed  Judgment Impaired  Confusion None  Danger to Self  Current suicidal ideation? Denies  Agreement Not to Harm Self Yes  Description of Agreement verbal  Danger to Others  Danger to Others None reported or observed

## 2023-11-15 NOTE — Progress Notes (Incomplete)
 [facility] MD Progress Note  11/15/2023 7:57 AM Garrett Bolton  MRN:  [id number] Subjective:   Garrett Bolton is a 23 y.o. male with a past psychiatric history of schizophrenia, unspecific schizophrenia spectrum disorder, substance induced psychosis, history of cannabis use who presents involuntarily to [facility] from [facility] emergency department due to thought blocking, paranoia, disorganization and erratic behavior in the setting of medication noncompliance.  He was transferred here for symptom stabilization and medication management. Denies issues with sleep or appetite.   24 hour events:  (Sleep Hours) - 7.75 (Any PRNs that were needed, meds refused, or side effects to meds)- No PRN meds given, no meds refused.  (Any disturbances and when (visitation, over night)- None  (Concerns raised by the patient)- None  (SI/HI/AVH)- Denies SI/HI/AVH  Patient interview:  Patient continues to be clear and organized with answering providers questions.  Is not overtly responding to internal stimuli. Denies any depressive symptoms, anxiety, suicidal ideations auditory hallucinations or visual hallucinations or homicidal ideations.  Patient amenable with receiving second injectable on Thursday.  No additional complaints noted  Collateral, [name] Ahumada, [phone] 1521 8/27 Patient granted consent to speak with sister about discharge planning and about hospital course without restrictions.  Sister states that she can come pick Garrett Bolton up at 1 PM on Friday after discharge.  Discussed medication regimen and administration of long-acting injectable has shown notable improvements in patient interactions. She reports compliance has been an issue with him before in the past and she has had to IVC him multiple times previously due to it.  Reports that in prior instances he has been disorganized and not making sense. Discussed follow-up appointments. Opportunity to ask additional questions was given.   Principal  Problem: Schizophrenia, unspecified (HCC) Diagnosis: Principal Problem:   Schizophrenia, unspecified (HCC) Active Problems:   Episodic cannabis use  Total Time spent with patient: 30 minutes  Past Psychiatric History:   Previous Psych Diagnoses: schizophrenia, unspecific schizophrenia spectrum disorder, substance induced psychosis, history of cannabis use Prior inpatient treatment: Multiple, 03/2019, 04/2019, 04/2019, 02/2020, 12/2020 and per chart review last hospitalization was with old [facility] in 2023 Current/prior outpatient treatment: Yes, not currently following with psychiatry Prior rehab hx: Unable to assess due to disorganization Psychotherapy hx: Unable to assess due to disorganization History of suicide: Unable to assess due to disorganization History of homicide or aggression: Unable to assess due to disorganization Psychiatric medication history: Per chart review Invega , Risperdal , Depakote , trazodone , hydroxyzine , benztropine , Haldol  Psychiatric medication compliance history: Noncompliance Neuromodulation history: Unaware Current Psychiatrist: Denies Current therapist: Denies  Past Medical History:  Past Medical History:  Diagnosis Date   Anemia    Sickle cell trait (HCC)     Past Surgical History:  Procedure Laterality Date   CIRCUMCISION  2001   LAPAROTOMY N/A 08/23/2019   Procedure: EXPLORATORY LAPAROTOMY;  Surgeon: Sebastian Moles, MD;  Location: [facility] OR;  Service: General;  Laterality: N/A;   Family History: History reviewed. No pertinent family history. Family Psychiatric  History:  Psych:depression on mother side,  bipolar and schizophrenia in paternal great grandparents per EMR  Psych Mk:Ejupzwu unable to disclose due to disorganization SA/HA:Patient unable to disclose due to disorganization Substance use family yk:Ejupzwu unable to disclose due to disorganization  Social History:  Social History   Substance and Sexual Activity  Alcohol Use Yes      Social History   Substance and Sexual Activity  Drug Use Yes   Types: Methamphetamines, Marijuana   Comment: Daily  Social History   Socioeconomic History   Marital status: Single    Spouse name: Not on file   Number of children: Not on file   Years of education: Not on file   Highest education level: Not on file  Occupational History   Not on file  Tobacco Use   Smoking status: Some Days    Current packs/day: 0.25    Average packs/day: 0.3 packs/day for 4.0 years (1.0 ttl pk-yrs)    Types: Cigars, Cigarettes   Smokeless tobacco: Never  Vaping Use   Vaping status: Never Used  Substance and Sexual Activity   Alcohol use: Yes   Drug use: Yes    Types: Methamphetamines, Marijuana    Comment: Daily   Sexual activity: Never  Other Topics Concern   Not on file  Social History Narrative   Not on file   Social Drivers of Health   Financial Resource Strain: Not on file  Food Insecurity: Food Insecurity Present (11/06/2023)   Hunger Vital Sign    Worried About Running Out of Food in the Last Year: Often true    Ran Out of Food in the Last Year: Often true  Transportation Needs: Unmet Transportation Needs (11/06/2023)   PRAPARE - Administrator, Civil Service (Medical): Yes    Lack of Transportation (Non-Medical): Yes  Physical Activity: Not on file  Stress: Not on file  Social Connections: Not on file   Additional Social History:                         Sleep: Good Estimated Sleeping Duration (Last 24 Hours): 5.00-6.25 hours  Appetite:  Good  Current Medications: Current Facility-Administered Medications  Medication Dose Route Frequency Provider Last Rate Last Admin   acetaminophen  (TYLENOL ) tablet 650 mg  650 mg Oral Q6H PRN Coleman, Carolyn H, NP       alum & mag hydroxide-simeth (MAALOX/MYLANTA) 200-200-20 MG/5ML suspension 30 mL  30 mL Oral Q4H PRN Mardy Elveria DEL, NP       feeding supplement (ENSURE PLUS HIGH PROTEIN) liquid 237 mL   237 mL Oral BID BM Towana Leita SAILOR, MD   237 mL at 11/13/23 1500   hydrOXYzine  (ATARAX ) tablet 25 mg  25 mg Oral TID PRN Coleman, Carolyn H, NP   25 mg at 11/11/23 2000   magnesium  hydroxide (MILK OF MAGNESIA) suspension 30 mL  30 mL Oral Daily PRN Mardy Elveria DEL, NP       OLANZapine  (ZYPREXA ) injection 10 mg  10 mg Intramuscular TID PRN Lenard Calin, MD       OLANZapine  (ZYPREXA ) injection 5 mg  5 mg Intramuscular TID PRN Lenard Calin, MD       OLANZapine  zydis (ZYPREXA ) disintegrating tablet 5 mg  5 mg Oral TID PRN Lenard Calin, MD       paliperidone  (INVEGA  SUSTENNA) injection 156 mg  156 mg Intramuscular Once Lenard Calin, MD       risperiDONE  (RISPERDAL  M-TABS) disintegrating tablet 2 mg  2 mg Oral BID Lenard Calin, MD   2 mg at 11/14/23 1628   traZODone  (DESYREL ) tablet 50 mg  50 mg Oral QHS PRN Coleman, Carolyn H, NP   50 mg at 11/11/23 2000    Lab Results:  No results found for this or any previous visit (from the past 48 hours).   Blood Alcohol level:  Lab Results  Component Value Date   [facility] <15 11/05/2023   ETH <  10 08/14/2021    Metabolic Disorder Labs: Lab Results  Component Value Date   HGBA1C 4.5 (L) 11/08/2023   MPG 82.45 11/08/2023   MPG 79.58 08/14/2021   Lab Results  Component Value Date   PROLACTIN 15.2 04/21/2019   Lab Results  Component Value Date   CHOL 120 11/08/2023   TRIG 56 11/08/2023   HDL 56 11/08/2023   CHOLHDL 2.1 11/08/2023   VLDL 11 11/08/2023   LDLCALC 53 11/08/2023   LDLCALC 90 01/01/2021    Physical Findings: AIMS:  ,  ,  ,  ,  ,  ,   CIWA:    COWS:     Musculoskeletal: Strength & Muscle Tone: within normal limits Gait & Station: normal Patient leans: N/A  Psychiatric Specialty Exam:  Presentation  General Appearance:  Appropriate for Environment; Casual  Eye Contact: Good  Speech: Clear and Coherent; Normal Rate  Speech Volume: Normal  Handedness: Right   Mood and Affect   Mood: Euthymic  Affect: Appropriate; Congruent   Thought Process  Thought Processes: Coherent; Linear  Descriptions of Associations:Intact  Orientation:Full (Time, Place and Person)  Thought Content:Logical; WDL  History of Schizophrenia/Schizoaffective disorder:Yes  Duration of Psychotic Symptoms:Greater than six months  Hallucinations:No data recorded   Ideas of Reference:None  Suicidal Thoughts:No data recorded   Homicidal Thoughts:No data recorded    Sensorium  Memory: Immediate Fair  Judgment: Fair  Insight: Fair   Art therapist  Concentration: Fair  Attention Span: Fair  Recall: [employer] of Knowledge: Fair  Language: Fair   Psychomotor Activity  Psychomotor Activity: No data recorded   Assets  Assets: Desire for Improvement; Communication Skills   Sleep  Sleep: Sleep: Good   Physical Exam: Physical Exam Constitutional:      General: He is not in acute distress.    Appearance: He is not toxic-appearing or diaphoretic.  Eyes:     Conjunctiva/sclera: Conjunctivae normal.  Pulmonary:     Effort: Pulmonary effort is normal.  Musculoskeletal:        General: Normal range of motion.  Neurological:     General: No focal deficit present.     Mental Status: He is alert.    Review of Systems  Constitutional:  Negative for chills, diaphoresis and fever.  Respiratory:  Negative for cough.   Gastrointestinal:  Negative for nausea and vomiting.  Neurological:  Negative for headaches.  Psychiatric/Behavioral:  Negative for depression, hallucinations, substance abuse and suicidal ideas. The patient is not nervous/anxious and does not have insomnia.    Blood pressure 103/66, pulse 86, temperature 98.1 F (36.7 C), temperature source Oral, resp. rate 16, height 5' 10 (1.778 m), weight 55.3 kg, SpO2 100%. Body mass index is 17.51 kg/m.  Treatment Plan Summary: Garrett Bolton is a 24 y.o. male with a past  psychiatric history of schizophrenia, unspecific schizophrenia spectrum disorder, substance induced psychosis, history of cannabis use who presents involuntarily to [facility] from [facility] emergency department due to thought blocking, paranoia, disorganization and erratic behavior in the setting of medication noncompliance.  He was transferred here for symptom stabilization and medication management.   On intake assessment, patient does appear to be psychotic given disorganization, responding to internal stimuli on the floor throughout interview and lack of insight of events leading up to hospitalization.  Patient is calm and cooperative throughout interview and has not required any agitation protocol.  Patient reports that he was previously on risperidone  help control symptoms.  However per the  pharmacy dispensed report patient has not recently had risperidone  filled (no record of dispenses since 04/2023). Patient has been hospitalized here multiple times before and seemed to do well on risperidone .  Will restart a low dose and continue to adjust to optimize symptom stabilization and hopes of transitioning to [facility]I.  We will call mother for further collateral after given patient's consent during intake assessment.  8/25:  On morning assessment, patient continues to improve we will remain on p.o. Risperdal  and denying any significant side effects after a long-acting injectable injection on 8/25, will administer 2nd dose on 8/28.  Sister states she can pick patient up on 8/29 at 1 PM if he continues to be stable for discharge.   Diagnoses / Active Problems: Schizophrenia  Cannabis Use (per UDS)    PLAN: Safety and Monitoring:             --  Involuntary admission to inpatient psychiatric unit for safety, stabilization and treatment             -- Daily contact with patient to assess and evaluate symptoms and progress in treatment             -- Patient's case to be discussed in multi-disciplinary team  meeting             -- Observation Level : q15 minute checks             -- Vital signs:  q12 hours             -- Precautions: suicide, elopement, and assault   2. Psychiatric Diagnoses and Treatment: --Continue Risperdal  2 mg twice daily for psychosis, can consider discontinuing after second LAI -- Administer Invega  Sustena 234 mg dose today, and 156 mg 8/28 --As needed agitation protocol ordered, Zyprexa  ordered and discontinued Haldol  given patient prior history of acute dystonic reaction with Haldol  -- PRNs available: Detailed in the [facility] --  The risks/benefits/side-effects/alternatives to this medication were discussed in detail with the patient and time was given for questions. The patient consents to medication trial.              -- Metabolic profile and EKG monitoring obtained while on an atypical antipsychotic (BMI: Lipid Panel: HbgA1c: QTc:)             -- Labs unremarkable              -- Encouraged patient to participate in unit milieu and in scheduled group therapies              -- Short Term Goals: Ability to identify changes in lifestyle to reduce recurrence of condition will improve, Ability to maintain clinical measurements within normal limits will improve, Compliance with prescribed medications will improve, and Ability to identify triggers associated with substance abuse/mental health issues will improve             -- Long Term Goals: Improvement in symptoms so as ready for discharge   3. Medical Issues Being Addressed:              Cannabis Use              -- UDS positive for THC              -- Smoking cessation encouraged   4. Discharge Planning:              -- Social work and case management to assist with discharge planning and identification of hospital  follow-up needs prior to discharge             -- Estimated LOS: 7-14 days, Planning for 8/29              -- Discharge Concerns:Medication compliance and effectiveness             -- Discharge Goals: Return  home with outpatient referrals for mental health follow-up including medication management/psychotherapy   I certify that inpatient services furnished can reasonably be expected to improve the patient's condition.    PATTI OLDEN, MD 11/15/2023, 7:57 AM

## 2023-11-16 MED ORDER — HYDROXYZINE HCL 25 MG PO TABS: 25.0000 mg | ORAL_TABLET | Freq: Three times a day (TID) | ORAL | 0 refills | Status: AC | PRN

## 2023-11-16 MED ORDER — PALIPERIDONE PALMITATE ER 156 MG/ML IM SUSY: 156.0000 mg | PREFILLED_SYRINGE | Freq: Once | INTRAMUSCULAR | Status: DC

## 2023-11-16 MED ORDER — PALIPERIDONE PALMITATE ER 156 MG/ML IM SUSY: 156.0000 mg | PREFILLED_SYRINGE | Freq: Once | INTRAMUSCULAR | 0 refills | Status: AC

## 2023-11-16 MED ORDER — TRAZODONE HCL 50 MG PO TABS: 50.0000 mg | ORAL_TABLET | Freq: Every evening | ORAL | 0 refills | Status: AC | PRN

## 2023-11-16 NOTE — Plan of Care (Signed)
  Problem: Education: Goal: Emotional status will improve Outcome: Adequate for Discharge Goal: Mental status will improve Outcome: Adequate for Discharge Goal: Verbalization of understanding the information provided will improve Outcome: Adequate for Discharge   Problem: Activity: Goal: Interest or engagement in activities will improve Outcome: Adequate for Discharge

## 2023-11-16 NOTE — Progress Notes (Signed)
 Patient discharged to home accompanied by family member. Discharge instructions, prescriptions, all required discharge documents and information about follow-up appointment given to pt with verbalization of understanding. All personal belongings returned to pt at time of discharge. Pt escorted to lobby by RN at 1512.  11/16/23 0925  Psych Admission Type (Psych Patients Only)  Admission Status Involuntary  Psychosocial Assessment  Patient Complaints None  Eye Contact Fair  Facial Expression Fixed smile  Affect Appropriate to circumstance  Speech Logical/coherent  Interaction Childlike;Cautious  Motor Activity Slow  Appearance/Hygiene Unremarkable  Behavior Characteristics Calm;Cooperative  Mood Pleasant  Thought Process  Coherency WDL  Content WDL  Delusions None reported or observed  Perception WDL  Hallucination None reported or observed  Judgment Limited  Confusion None  Danger to Self  Current suicidal ideation? Denies  Agreement Not to Harm Self Yes  Description of Agreement Verbal  Danger to Others  Danger to Others None reported or observed

## 2023-11-16 NOTE — Progress Notes (Signed)
  [facility] Adult Case Management Discharge Plan :  Will you be returning to the same living situation after discharge:  No, patient will go to his sister's home, Kaylor Simenson (sister) [phone]  At discharge, do you have transportation home?: Yes,  patient's sister, Maykel Reitter will pick him up at 1 PM Do you have the ability to pay for your medications: No.  Release of information consent forms completed and in the chart;  Patient's signature needed at discharge.  Patient to Follow up at:  Follow-up Information     [facility] Go on 11/22/2023.   Why: Please go to this provider on 11/22/23 at 1:00 pm for an assessment, to obtain therapy and medication management services. Contact information: [address] [place] [address] [phone]                 Next level of care provider has access to [facility] Link:no  Safety Planning and Suicide Prevention discussed: Yes,  Heather Streeper (sister) [phone] and Spiro Ausborn (mom / legal guardian) [phone]  Has patient been referred to the Quitline?: Patient refused referral for treatment.  Patient said that he smokes cigarettes twice per day, and doesn't want to stop at this time.  Patient has been referred for addiction treatment:  At admission, patient tested positive for marijuana.  He said that he used marijuana with his friend, but prior to that hasn't used in a while.  He said that he doesn't have a desire to use, and will not need therapist's help at this time.   Maika Mcelveen O Alianys Chacko, LCSWA 11/16/2023, 10:04 AM

## 2023-11-16 NOTE — Plan of Care (Signed)
  Problem: Activity: Goal: Interest or engagement in activities will improve Outcome: Progressing Goal: Sleeping patterns will improve Outcome: Progressing   Problem: Coping: Goal: Ability to verbalize frustrations and anger appropriately will improve Outcome: Progressing Goal: Ability to demonstrate self-control will improve Outcome: Progressing   Problem: Health Behavior/Discharge Planning: Goal: Identification of resources available to assist in meeting health care needs will improve Outcome: Progressing Goal: Compliance with treatment plan for underlying cause of condition will improve Outcome: Progressing   Problem: Safety: Goal: Periods of time without injury will increase Outcome: Progressing   Problem: Physical Regulation: Goal: Ability to maintain clinical measurements within normal limits will improve Outcome: Progressing

## 2023-11-16 NOTE — Progress Notes (Signed)
(  Sleep Hours) - 6 (Any PRNs that were needed, meds refused, or side effects to meds)- N/A (Any disturbances and when (visitation, over night)- N/A  (Concerns raised by the patient)- N/A  (SI/HI/AVH)- DENIES

## 2023-11-16 NOTE — Group Note (Signed)
 Recreation Therapy Group Note   Group Topic:Problem Solving  Group Date: 11/16/2023 Start Time: 1015 End Time: 1035 Facilitators: Lashala Laser-McCall, LRT,CTRS Location: [address] Dayroom   Group Topic: Communication, Team Building, Problem Solving  Goal Area(s) Addresses:  Patient will effectively work with peer towards shared goal.  Patient will identify skills used to make activity successful.  Patient will identify how skills used during activity can be used to reach post d/c goals.   Behavioral Response: Engaged  Intervention: STEM Activity  Activity: Straw Bridge. In teams of 3-5, patients were given 15 plastic drinking straws and an equal length of masking tape. Using the materials provided, patients were instructed to build a free standing bridge-like structure to suspend an everyday item (ex: puzzle box) off of the floor or table surface. All materials were required to be used by the team in their design. LRT facilitated post-activity discussion reviewing team process. Patients were encouraged to reflect how the skills used in this activity can be generalized to daily life post discharge.   Education: Pharmacist, community, Scientist, physiological, Discharge Planning   Education Outcome: Acknowledges education/In group clarification offered/Needs additional education.    Affect/Mood: Appropriate   Participation Level: Engaged   Participation Quality: Independent   Behavior: Appropriate   Speech/Thought Process: Irrational   Insight: Limited   Judgement: Limited   Modes of Intervention: STEM Activity   Patient Response to Interventions:  Engaged   Education Outcome:  In group clarification offered    Clinical Observations/Individualized Feedback: Pt was bright and in good spirits about going home. Pt worked by himself on the activity. Pt was unable to follow the instructions given to him. Pt veered off into his own thoughts and created a structure that had nothing to do  with the objective of the activity. Pt built a structure he claimed was a gun and was explaining to LRT the parts and how each one worked.     Plan: Continue to engage patient in RT group sessions 2-3x/week.   Kaneisha Ellenberger-McCall, LRT,CTRS 11/16/2023 12:55 PM
